# Patient Record
Sex: Male | Born: 1955
Health system: Southern US, Community
[De-identification: ages and names within clinical notes are randomized; demographics above are authoritative.]

## PROBLEM LIST (undated history)

## (undated) DIAGNOSIS — I10 Essential (primary) hypertension: Secondary | ICD-10-CM

## (undated) DIAGNOSIS — H35039 Hypertensive retinopathy, unspecified eye: Secondary | ICD-10-CM

## (undated) DIAGNOSIS — T7840XA Allergy, unspecified, initial encounter: Secondary | ICD-10-CM

## (undated) DIAGNOSIS — H3530 Unspecified macular degeneration: Secondary | ICD-10-CM

## (undated) DIAGNOSIS — H269 Unspecified cataract: Secondary | ICD-10-CM

## (undated) HISTORY — DX: Allergy, unspecified, initial encounter: T78.40XA

## (undated) HISTORY — DX: Unspecified cataract: H26.9

## (undated) HISTORY — DX: Essential (primary) hypertension: I10

## (undated) HISTORY — PX: COLONOSCOPY: SHX174

## (undated) HISTORY — PX: GALLBLADDER SURGERY: SHX652

## (undated) HISTORY — DX: Hypertensive retinopathy, unspecified eye: H35.039

## (undated) HISTORY — PX: EYE SURGERY: SHX253

## (undated) HISTORY — DX: Unspecified macular degeneration: H35.30

## (undated) HISTORY — PX: CHOLECYSTECTOMY: SHX55

---

## 2005-02-19 ENCOUNTER — Emergency Department (HOSPITAL_COMMUNITY): Admission: EM | Admit: 2005-02-19 | Discharge: 2005-02-19 | Payer: Self-pay | Admitting: *Deleted

## 2006-03-05 ENCOUNTER — Ambulatory Visit: Payer: Self-pay | Admitting: Internal Medicine

## 2006-04-01 ENCOUNTER — Ambulatory Visit: Payer: Self-pay | Admitting: Internal Medicine

## 2006-04-01 ENCOUNTER — Encounter (INDEPENDENT_AMBULATORY_CARE_PROVIDER_SITE_OTHER): Payer: Self-pay | Admitting: *Deleted

## 2008-04-16 HISTORY — PX: ACHILLES TENDON REPAIR: SUR1153

## 2010-01-02 ENCOUNTER — Inpatient Hospital Stay (HOSPITAL_COMMUNITY): Admission: RE | Admit: 2010-01-02 | Discharge: 2010-01-04 | Payer: Self-pay | Admitting: Orthopedic Surgery

## 2010-06-29 LAB — COMPREHENSIVE METABOLIC PANEL
ALT: 19 U/L (ref 0–53)
AST: 20 U/L (ref 0–37)
Albumin: 3.9 g/dL (ref 3.5–5.2)
Alkaline Phosphatase: 40 U/L (ref 39–117)
BUN: 10 mg/dL (ref 6–23)
CO2: 27 mEq/L (ref 19–32)
Calcium: 9 mg/dL (ref 8.4–10.5)
Chloride: 102 mEq/L (ref 96–112)
Creatinine, Ser: 1.01 mg/dL (ref 0.4–1.5)
GFR calc Af Amer: >60 mL/min (ref >60)
GFR calc non Af Amer: >60 mL/min (ref >60)
Glucose, Bld: 92 mg/dL (ref 70–99)
Potassium: 4.6 mEq/L (ref 3.5–5.1)
Sodium: 136 mEq/L (ref 135–145)
Total Bilirubin: 0.9 mg/dL (ref 0.3–1.2)
Total Protein: 8 g/dL (ref 6.0–8.3)

## 2010-06-29 LAB — URINALYSIS, ROUTINE W REFLEX MICROSCOPIC
Bilirubin Urine: NEGATIVE
Glucose, UA: NEGATIVE mg/dL
Hgb urine dipstick: NEGATIVE
Ketones, ur: NEGATIVE mg/dL
Nitrite: NEGATIVE
Protein, ur: NEGATIVE mg/dL
Specific Gravity, Urine: 1.007 (ref 1.005–1.030)
Urobilinogen, UA: 0.2 mg/dL (ref 0.0–1.0)
pH: 7.5 (ref 5.0–8.0)

## 2010-06-29 LAB — PROTIME-INR
INR: 0.99 (ref 0.00–1.49)
INR: 1.07 (ref 0.00–1.49)
INR: 1.54 — ABNORMAL HIGH (ref 0.00–1.49)
Prothrombin Time: 13.3 seconds (ref 11.6–15.2)
Prothrombin Time: 14.1 seconds (ref 11.6–15.2)
Prothrombin Time: 18.7 seconds — ABNORMAL HIGH (ref 11.6–15.2)

## 2010-06-29 LAB — DIFFERENTIAL
Basophils Absolute: 0 10*3/uL (ref 0.0–0.1)
Basophils Relative: 1 % (ref 0–1)
Eosinophils Absolute: 0.2 10*3/uL (ref 0.0–0.7)
Eosinophils Relative: 3 % (ref 0–5)
Lymphocytes Relative: 35 % (ref 12–46)
Lymphs Abs: 2.7 10*3/uL (ref 0.7–4.0)
Monocytes Absolute: 0.6 10*3/uL (ref 0.1–1.0)
Monocytes Relative: 7 % (ref 3–12)
Neutro Abs: 4.2 10*3/uL (ref 1.7–7.7)
Neutrophils Relative %: 54 % (ref 43–77)

## 2010-06-29 LAB — CBC
HCT: 45.7 % (ref 39.0–52.0)
Hemoglobin: 15.9 g/dL (ref 13.0–17.0)
MCH: 31 pg (ref 26.0–34.0)
MCHC: 34.9 g/dL (ref 30.0–36.0)
MCV: 88.8 fL (ref 78.0–100.0)
Platelets: 171 10*3/uL (ref 150–400)
RBC: 5.15 MIL/uL (ref 4.22–5.81)
RDW: 13.8 % (ref 11.5–15.5)
WBC: 7.9 10*3/uL (ref 4.0–10.5)

## 2010-06-29 LAB — APTT: aPTT: 32 seconds (ref 24–37)

## 2010-06-29 LAB — SURGICAL PCR SCREEN
MRSA, PCR: NEGATIVE
Staphylococcus aureus: NEGATIVE

## 2016-04-30 ENCOUNTER — Encounter: Payer: Self-pay | Admitting: Internal Medicine

## 2017-11-14 ENCOUNTER — Ambulatory Visit (INDEPENDENT_AMBULATORY_CARE_PROVIDER_SITE_OTHER): Payer: BLUE CROSS/BLUE SHIELD | Admitting: Family Medicine

## 2017-11-14 ENCOUNTER — Encounter: Payer: Self-pay | Admitting: Family Medicine

## 2017-11-14 VITALS — BP 138/85 | HR 72 | Temp 97.0°F | Ht 73.0 in | Wt 254.8 lb

## 2017-11-14 DIAGNOSIS — M7711 Lateral epicondylitis, right elbow: Secondary | ICD-10-CM

## 2017-11-14 NOTE — Progress Notes (Signed)
BP 138/85   Pulse 72   Temp (!) 97 F (36.1 C) (Oral)   Ht 6\' 1"  (1.854 m)   Wt 254 lb 12.8 oz (115.6 kg)   BMI 33.62 kg/m    Subjective:    [name], male    DOB: Sep 01, 1955, 62 y.o.   MRN: [id number]  HPI: Eric Key is a 62 y.o. male presenting on 11/14/2017 for New Patient (Initial Visit) and Elbow Pain (right. Patient states 6/18 that he did lawn work and then next morning right shoulder pain and now has moved down to elbow.)   HPI Right elbow pain Patient is coming as a new patient for office with complaints of right elbow and arm pain.  Patient stated that this started around 10/01/2017.  He says he was doing lawn work especially using a weed eater and then the next morning woke up with right shoulder pain initially that moved down to her right lateral elbow pain.  Patient says that the pain hurts with grip strength and rotation of his forearm.  The pain has been nagging and more over the past month.  He describes the pain as mild to moderate in severity but just wants to know what he can do to help with it.  He denies any pain in his shoulder currently but says it is all in his lateral elbow now.  Relevant past medical, surgical, family and social history reviewed and updated as indicated. Interim medical history since our last visit reviewed. Allergies and medications reviewed and updated.  Review of Systems  Constitutional: Negative for chills and fever.  Eyes: Negative for visual disturbance.  Respiratory: Negative for shortness of breath and wheezing.   Cardiovascular: Negative for chest pain and leg swelling.  Musculoskeletal: Positive for arthralgias. Negative for back pain, gait problem, joint swelling and myalgias.  Skin: Negative for rash.  Neurological: Negative for dizziness, weakness and numbness.  All other systems reviewed and are negative.   Per HPI unless specifically indicated above  Social History   Socioeconomic History  .  Marital status: Divorced    Spouse name: Not on file  . Number of children: Not on file  . Years of education: Not on file  . Highest education level: Not on file  Occupational History  . Not on file  Social Needs  . Financial resource strain: Not on file  . Food insecurity:    Worry: Not on file    Inability: Not on file  . Transportation needs:    Medical: Not on file    Non-medical: Not on file  Tobacco Use  . Smoking status: Former Smoker    Last attempt to quit: 11/14/2005    Years since quitting: 12.0  . Smokeless tobacco: Never Used  Substance and Sexual Activity  . Alcohol use: Yes    Comment: occ  . Drug use: Never  . Sexual activity: Yes    Birth control/protection: Surgical  Lifestyle  . Physical activity:    Days per week: Not on file    Minutes per session: Not on file  . Stress: Not on file  Relationships  . Social connections:    Talks on phone: Not on file    Gets together: Not on file    Attends religious service: Not on file    Active member of club or organization: Not on file    Attends meetings of clubs or organizations: Not on file    Relationship status:  Not on file  . Intimate partner violence:    Fear of current or ex partner: Not on file    Emotionally abused: Not on file    Physically abused: Not on file    Forced sexual activity: Not on file  Other Topics Concern  . Not on file  Social History Narrative  . Not on file    Past Surgical History:  Procedure Laterality Date  . ACHILLES TENDON REPAIR  2010  . GALLBLADDER SURGERY      Family History  Problem Relation Age of Onset  . Diabetes Brother   . Heart disease Brother   . Diabetes Maternal Grandfather     Allergies as of 11/14/2017      Reactions   Penicillins Hives      Medication List    as of 11/14/2017  4:24 PM   You have not been prescribed any medications.        Objective:    BP 138/85   Pulse 72   Temp (!) 97 F (36.1 C) (Oral)   Ht 6\' 1"  (1.854 m)   Wt  254 lb 12.8 oz (115.6 kg)   BMI 33.62 kg/m   Wt Readings from Last 3 Encounters:  11/14/17 254 lb 12.8 oz (115.6 kg)    Physical Exam  Constitutional: He is oriented to person, place, and time. He appears well-developed and well-nourished. No distress.  Eyes: Conjunctivae are normal. No scleral icterus.  Neck: Neck supple. No thyromegaly present.  Cardiovascular: Normal rate, regular rhythm, normal heart sounds and intact distal pulses.  No murmur heard. Pulmonary/Chest: Effort normal and breath sounds normal. No respiratory distress. He has no wheezes.  Musculoskeletal: Normal range of motion. He exhibits no edema.       Right elbow: He exhibits no swelling, no effusion and no deformity. Tenderness found. Lateral epicondyle tenderness noted.  Lymphadenopathy:    He has no cervical adenopathy.  Neurological: He is alert and oriented to person, place, and time. Coordination normal.  Skin: Skin is warm and dry. No rash noted. He is not diaphoretic.  Psychiatric: He has a normal mood and affect. His behavior is normal.  Nursing note and vitals reviewed.       Assessment & Plan:   Problem List Items Addressed This Visit    None    Visit Diagnoses    Lateral epicondylitis of right elbow    -  Primary    We discussed possible options including injection versus using oral anti-inflammatory such as ibuprofen for couple weeks.  Patient opted to do the oral anti-inflammatories for now and if it does not improve then will do injection.  Also gave patient a list of stretches to help and exercises to help.  Follow up plan: Return if symptoms worsen or fail to improve.  Caryl Pina, MD [facility] 11/14/2017, 4:24 PM

## 2017-11-29 ENCOUNTER — Encounter: Payer: Self-pay | Admitting: Family Medicine

## 2017-11-29 ENCOUNTER — Ambulatory Visit (INDEPENDENT_AMBULATORY_CARE_PROVIDER_SITE_OTHER): Payer: BLUE CROSS/BLUE SHIELD | Admitting: Family Medicine

## 2017-11-29 VITALS — BP 131/73 | HR 69 | Temp 97.6°F | Ht 73.0 in | Wt 259.6 lb

## 2017-11-29 DIAGNOSIS — Z131 Encounter for screening for diabetes mellitus: Secondary | ICD-10-CM | POA: Diagnosis not present

## 2017-11-29 DIAGNOSIS — M7711 Lateral epicondylitis, right elbow: Secondary | ICD-10-CM | POA: Diagnosis not present

## 2017-11-29 DIAGNOSIS — Z125 Encounter for screening for malignant neoplasm of prostate: Secondary | ICD-10-CM | POA: Diagnosis not present

## 2017-11-29 DIAGNOSIS — Z0000 Encounter for general adult medical examination without abnormal findings: Secondary | ICD-10-CM

## 2017-11-29 DIAGNOSIS — Z13220 Encounter for screening for lipoid disorders: Secondary | ICD-10-CM | POA: Diagnosis not present

## 2017-11-29 MED ORDER — METHYLPREDNISOLONE ACETATE 80 MG/ML IJ SUSP
40.0000 mg | Freq: Once | INTRAMUSCULAR | Status: AC
  Administered 2017-11-29: 40 mg via INTRAMUSCULAR

## 2017-11-29 NOTE — Progress Notes (Addendum)
BP 131/73   Pulse 69   Temp 97.6 F (36.4 C) (Oral)   Ht 6' 1"  (1.854 m)   Wt 259 lb 9.6 oz (117.8 kg)   BMI 34.25 kg/m    Subjective:    [name], male    DOB: Sep 07, 1955, 62 y.o.   MRN: [id number]  HPI: Eric Key is a 62 y.o. male presenting on 11/29/2017 for New Patient (Initial Visit) (Patient is still having right elbow pain- patient was seen 8/1) and Establish Care   HPI Well adult exam and physical Patient is coming in for well adult exam and physical.  He denies any major health issues except he is still having problems with his right lateral elbow.  He has been taking ibuprofen twice a day every day over the past couple weeks and says it has helped a little bit but has not made a significant difference.  He is ready to try the injection today.  He denies any numbness or weakness but just has pain with movement especially repetitive movement of that right arm on the lateral aspect of his right elbow. Patient denies any chest pain, shortness of breath, headaches or vision issues, abdominal complaints, diarrhea, nausea, vomiting.   Relevant past medical, surgical, family and social history reviewed and updated as indicated. Interim medical history since our last visit reviewed. Allergies and medications reviewed and updated.  Review of Systems  Constitutional: Negative for chills and fever.  HENT: Negative for ear pain and tinnitus.   Eyes: Negative for pain.  Respiratory: Negative for cough, shortness of breath and wheezing.   Cardiovascular: Negative for chest pain, palpitations and leg swelling.  Gastrointestinal: Negative for abdominal pain, blood in stool, constipation and diarrhea.  Genitourinary: Negative for dysuria and hematuria.  Musculoskeletal: Positive for arthralgias. Negative for back pain, gait problem and myalgias.  Skin: Negative for rash.  Neurological: Negative for dizziness, weakness and headaches.  Psychiatric/Behavioral:  Negative for suicidal ideas.  All other systems reviewed and are negative.   Per HPI unless specifically indicated above   Allergies as of 11/29/2017      Reactions   Penicillins Hives      Medication List    as of 11/29/2017  8:42 AM   You have not been prescribed any medications.        Objective:    BP 131/73   Pulse 69   Temp 97.6 F (36.4 C) (Oral)   Ht 6' 1"  (1.854 m)   Wt 259 lb 9.6 oz (117.8 kg)   BMI 34.25 kg/m   Wt Readings from Last 3 Encounters:  11/29/17 259 lb 9.6 oz (117.8 kg)  11/14/17 254 lb 12.8 oz (115.6 kg)    Physical Exam  Constitutional: He is oriented to person, place, and time. He appears well-developed and well-nourished. No distress.  HENT:  Nose: Nose normal.  Mouth/Throat: Oropharynx is clear and moist. No oropharyngeal exudate.  Eyes: Pupils are equal, round, and reactive to light. Conjunctivae and EOM are normal. No scleral icterus.  Neck: Neck supple. No thyromegaly present.  Cardiovascular: Normal rate, regular rhythm, normal heart sounds and intact distal pulses.  No murmur heard. Pulmonary/Chest: Effort normal and breath sounds normal. No respiratory distress. He has no wheezes.  Abdominal: Soft. Bowel sounds are normal. He exhibits no distension. There is no tenderness. There is no guarding.  Musculoskeletal: Normal range of motion. He exhibits no edema.  Lymphadenopathy:    He has no cervical adenopathy.  Neurological: He is alert and oriented to person, place, and time. He displays normal reflexes. Coordination normal.  Skin: Skin is warm and dry. No rash noted. He is not diaphoretic.  Psychiatric: He has a normal mood and affect. His behavior is normal.  Nursing note and vitals reviewed.  Lateral elbow condylitis injection: Consent form signed. Risk factors of bleeding and infection discussed with patient and patient is agreeable towards injection. Patient prepped with Betadine. Lateral approach towards injection used.  Injected 40 mg of Depo-Medrol and 1 mL of 2% lidocaine. Patient tolerated procedure well and no side effects from noted. Minimal to no bleeding. Simple bandage applied after.     Assessment & Plan:   Problem List Items Addressed This Visit    None    Visit Diagnoses    Well adult exam    -  Primary   Relevant Orders   CBC with Differential/Platelet   CMP14+EGFR   Lipid panel   PSA, total and free   Prostate cancer screening       Relevant Orders   PSA, total and free   Lateral epicondylitis of right elbow       Relevant Medications   methylPREDNISolone acetate (DEPO-MEDROL) injection 40 mg (Start on 11/29/2017  8:45 AM)   Lipid screening       Relevant Orders   Lipid panel   Diabetes mellitus screening       Relevant Orders   CMP14+EGFR       Follow up plan: Return in about 1 year (around 11/30/2018), or if symptoms worsen or fail to improve.  Counseling provided for all of the vaccine components Orders Placed This Encounter  Procedures  . CBC with Differential/Platelet  . CMP14+EGFR  . Lipid panel  . PSA, total and free    Caryl Pina, MD [facility] 11/29/2017, 8:42 AM

## 2017-11-30 LAB — CMP14+EGFR
A/G RATIO: 1.3 (ref 1.2–2.2)
ALBUMIN: 4 g/dL (ref 3.6–4.8)
ALT: 16 IU/L (ref 0–44)
AST: 17 IU/L (ref 0–40)
Alkaline Phosphatase: 39 IU/L (ref 39–117)
BUN/Creatinine Ratio: 13 (ref 10–24)
BUN: 13 mg/dL (ref 8–27)
CHLORIDE: 103 mmol/L (ref 96–106)
CO2: 23 mmol/L (ref 20–29)
Calcium: 9.2 mg/dL (ref 8.6–10.2)
Creatinine, Ser: 1.03 mg/dL (ref 0.76–1.27)
GFR calc non Af Amer: 79 mL/min/{1.73_m2} (ref >59)
GFR, EST AFRICAN AMERICAN: 91 mL/min/{1.73_m2} (ref >59)
Globulin, Total: 3.1 g/dL (ref 1.5–4.5)
Glucose: 113 mg/dL — ABNORMAL HIGH (ref 65–99)
Potassium: 4.5 mmol/L (ref 3.5–5.2)
Sodium: 137 mmol/L (ref 134–144)
TOTAL PROTEIN: 7.1 g/dL (ref 6.0–8.5)

## 2017-11-30 LAB — LIPID PANEL
CHOL/HDL RATIO: 4.2 ratio (ref 0.0–5.0)
Cholesterol, Total: 165 mg/dL (ref 100–199)
HDL: 39 mg/dL — ABNORMAL LOW (ref >39)
LDL Calculated: 103 mg/dL — ABNORMAL HIGH (ref 0–99)
Triglycerides: 117 mg/dL (ref 0–149)
VLDL CHOLESTEROL CAL: 23 mg/dL (ref 5–40)

## 2017-11-30 LAB — PSA, TOTAL AND FREE
PSA, Free Pct: 36.2 %
PSA, Free: 0.47 ng/mL
Prostate Specific Ag, Serum: 1.3 ng/mL (ref 0.0–4.0)

## 2017-11-30 LAB — CBC WITH DIFFERENTIAL/PLATELET
Basophils Absolute: 0 10*3/uL (ref 0.0–0.2)
Basos: 1 %
EOS (ABSOLUTE): 0.2 10*3/uL (ref 0.0–0.4)
Eos: 4 %
Hematocrit: 41.6 % (ref 37.5–51.0)
Hemoglobin: 13.4 g/dL (ref 13.0–17.7)
Immature Grans (Abs): 0 10*3/uL (ref 0.0–0.1)
Immature Granulocytes: 0 %
Lymphocytes Absolute: 2 10*3/uL (ref 0.7–3.1)
Lymphs: 34 %
MCH: 29.5 pg (ref 26.6–33.0)
MCHC: 32.2 g/dL (ref 31.5–35.7)
MCV: 92 fL (ref 79–97)
Monocytes Absolute: 0.5 10*3/uL (ref 0.1–0.9)
Monocytes: 9 %
Neutrophils Absolute: 3.2 10*3/uL (ref 1.4–7.0)
Neutrophils: 52 %
Platelets: 187 10*3/uL (ref 150–450)
RBC: 4.54 x10E6/uL (ref 4.14–5.80)
RDW: 12.8 % (ref 12.3–15.4)
WBC: 5.9 10*3/uL (ref 3.4–10.8)

## 2018-09-05 ENCOUNTER — Telehealth: Payer: Self-pay | Admitting: Family Medicine

## 2019-06-16 NOTE — Progress Notes (Signed)
[facility] Note  06/18/2019     CHIEF COMPLAINT Patient presents for Retina Evaluation   HISTORY OF PRESENT ILLNESS: Eric Key is a 64 y.o. male who presents to the clinic today for:   HPI    Retina Evaluation    In left eye.  This started 2 days ago.  Duration of 2 days.  Associated Symptoms Distortion.  Context:  distance vision, mid-range vision and near vision.  Treatments tried include no treatments.  I, the attending physician,  performed the HPI with the patient and updated documentation appropriately.          Comments    64 y/o male pt referred by Dr. [name] on 06/15/19 for eval of mac heme OS.  Pt works with steel, and noticed 3 days ago that a straight section of pipe looked crooked.  He went to see Dr. Molli Posey that day, and she sent him here.  Last full eye exam more than 10 yrs ago.  VA good OU cc, but now a bit blurred OS.  Denies pain, flashes, floaters.  Distortion only in OS.  No gtts.  Nothing like this has ever happened before.       Last edited by [name], MD on 06/18/2019 10:33 PM. (History)    pt states for about 2 weeks he has felt like his left eye vision was down, he states he noticed a piece of steel at work looked crooked as did the trim in his office, so he decided to go to the eye dr, he states he saw Dr. Jabier Mutton about 10 years ago when he moved to [facility] to get glasses, he saw her last Monday bc of the vision changes and she sent him here for a second opinion, pt states he used to have high blood pressure, but does not currently take any medications, he denies family hx of eye problems  Referring physician: [name], [name], MD [place],  [place] [id number]  HISTORICAL INFORMATION:   Selected notes from the [facility] Referred by Dr. Molli Posey for concern of mac heme OS.   CURRENT MEDICATIONS: No current outpatient medications on file. (Ophthalmic Drugs)   No current  facility-administered medications for this visit. (Ophthalmic Drugs)   No current outpatient medications on file. (Other)   No current facility-administered medications for this visit. (Other)      REVIEW OF SYSTEMS: ROS    Positive for: Eyes   Negative for: Constitutional, Gastrointestinal, Neurological, Skin, Genitourinary, Musculoskeletal, HENT, Endocrine, Cardiovascular, Respiratory, Psychiatric, Allergic/Imm, Heme/Lymph   Last edited by Matthew Folks, COA on 06/18/2019  8:54 AM. (History)       ALLERGIES Allergies  Allergen Reactions  . Penicillins Hives    PAST MEDICAL HISTORY Past Medical History:  Diagnosis Date  . Hypertension    Past Surgical History:  Procedure Laterality Date  . ACHILLES TENDON REPAIR  2010  . GALLBLADDER SURGERY      FAMILY HISTORY Family History  Problem Relation Age of Onset  . Diabetes Brother   . Heart disease Brother   . Diabetes Maternal Grandfather     SOCIAL HISTORY Social History   Tobacco Use  . Smoking status: Former Smoker    Quit date: 11/14/2005    Years since quitting: 13.6  . Smokeless tobacco: Never Used  Substance Use Topics  . Alcohol use: Yes    Comment: occ  . Drug use: Never  OPHTHALMIC EXAM:  Base Eye Exam    Visual Acuity (Snellen - Linear)      Right Left   Dist cc 20/20 -2 20/40 -2   Dist ph cc  NI   Correction: Glasses       Tonometry (Tonopen, 8:55 AM)      Right Left   Pressure 12 13       Pupils      Dark Light Shape React APD   Right 4 3 Round Brisk None   Left 4 3 Round Brisk None       Visual Fields (Counting fingers)      Left Right    Full Full       Extraocular Movement      Right Left    Full, Ortho Full, Ortho       Neuro/Psych    Oriented x3: Yes   Mood/Affect: Normal       Dilation    Both eyes: 1.0% Mydriacyl, 2.5% Phenylephrine @ 8:55 AM        Slit Lamp and Fundus Exam    Slit Lamp Exam      Right Left   Lids/Lashes Dermatochalasis -  upper lid Dermatochalasis - upper lid   Conjunctiva/Sclera White and quiet White and quiet   Cornea mild Arcus, mild Debris in tear film mild Arcus, mild Debris in tear film, 1+ inferior Punctate epithelial erosions   Anterior Chamber Deep and quiet Deep and quiet   Iris Round and dilated Round and dilated   Lens 2+ Nuclear sclerosis, 2+ Cortical cataract 2+ Nuclear sclerosis, 2+ Cortical cataract   Vitreous Vitreous syneresis Vitreous syneresis       Fundus Exam      Right Left   Disc Pink and Sharp, focal PPP ST Pink and Sharp   C/D Ratio 0.2 0.3   Macula Flat, Drusen, RPE mottling and clumping, early Atrophy Abnormal foveal reflex, +CNV with +IRH/SRH, +central edema, Drusen, RPE mottling and clumping   Vessels Vascular attenuation, AV crossing changes, perivascular pigment clumping Vascular attenuation, Tortuousity, perivascular pigment clumping   Periphery Attached, peripheral pigmented CR scarring and perivascular bone spicules 360 Attached, peripheral pigmented CR scarring and perivascular bone spicules 360        Refraction    Wearing Rx      Sphere Cylinder Axis   Right [place] +1.00 171   Left +0.25 +0.50 174   Age: 37 yrs       Manifest Refraction      Sphere Cylinder Axis Dist VA   Right +0.25 +1.00 180 20/20   Left +0.25 +0.25 170 20/40-2          IMAGING AND PROCEDURES  Imaging and Procedures for _0 @  OCT, Retina - OU - Both Eyes       Right Eye Quality was good. Central Foveal Thickness: 309. Progression has no prior data. Findings include normal foveal contour, no IRF, no SRF, retinal drusen , outer retinal atrophy, pigment epithelial detachment (Low lying PEDs temporal macula caught on widefield).   Left Eye Quality was good. Central Foveal Thickness: 523. Progression has no prior data. Findings include abnormal foveal contour, intraretinal fluid, subretinal hyper-reflective material, pigment epithelial detachment, outer retinal atrophy, retinal drusen   (Mild vitreous opacitites).   Notes *Images captured and stored on drive  Diagnosis / Impression:  OD: non-exu ARMD  OS: exu ARMD  Clinical management:  See below  Abbreviations: NFP - Normal foveal profile. CME - cystoid macular  edema. PED - pigment epithelial detachment. IRF - intraretinal fluid. SRF - subretinal fluid. EZ - ellipsoid zone. ERM - epiretinal membrane. ORA - outer retinal atrophy. ORT - outer retinal tubulation. SRHM - subretinal hyper-reflective material        Color Fundus Photography Optos - OU - Both Eyes       Right Eye Progression has no prior data. Disc findings include normal observations (Focal PPA ST). Macula : retinal pigment epithelium abnormalities, drusen. Vessels : attenuated. Periphery : RPE abnormality (360 pigmented CR scarring and perivascular pigment clumping and bone spicules).   Left Eye Progression has no prior data. Disc findings include normal observations. Macula : hemorrhage, retinal pigment epithelium abnormalities, drusen. Vessels : attenuated. Periphery : RPE abnormality (360 pigmented CR scarring and perivascular pigment clumping and bone spicules).   Notes **Images stored on drive**  Impression: Pigmented CR scarring and perivascular pigment clumping / bone spicules OU Central CNV w/ subretinal heme OS         Intravitreal Injection, Pharmacologic Agent - OS - Left Eye       Time Out 06/18/2019. 10:21 AM. Confirmed correct patient, procedure, site, and patient consented.   Anesthesia Topical anesthesia was used. Anesthetic medications included Lidocaine 2%, Proparacaine 0.5%.   Procedure Preparation included 5% betadine to ocular surface, eyelid speculum. A supplied needle was used.   Injection:  1.25 mg Bevacizumab (AVASTIN) SOLN   NDC: 31517-616-07, Lot: [id number] , Expiration date: 07/01/2019   Route: Intravitreal, Site: Left Eye, Waste: 0 mL  Post-op Post injection exam found visual acuity of at least counting  fingers. The patient tolerated the procedure well. There were no complications. The patient received written and verbal post procedure care education.                 ASSESSMENT/PLAN:    ICD-10-CM   1. Exudative age-related macular degeneration of left eye with active choroidal neovascularization (HCC)  H35.3221 Intravitreal Injection, Pharmacologic Agent - OS - Left Eye    Bevacizumab (AVASTIN) SOLN 1.25 mg  2. Retinal edema  H35.81 OCT, Retina - OU - Both Eyes  3. 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 Pigmentary retinopathy  H35.52 Color Fundus Photography Optos - OU - Both Eyes  5. Essential hypertension  I10   6. Hypertensive retinopathy of both eyes  H35.033   7. Combined forms of age-related cataract of both eyes  H25.813     1,2. 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, OS  - The incidence pathology and anatomy of wet AMD discussed   - The ANCHOR, MARINA, CATT and VIEW trials discussed with patient.    - discussed treatment options including observation vs intravitreal anti-VEGF agents such as Avastin, Lucentis, Eylea.    - Risks of endophthalmitis and vascular occlusive events and atrophic changes discussed with patient  - exam shows central CNV with +subretinal heme  - OCT shows CNV/PED w/ IRF and SRHM  - recommend IVA OS #1 today, 03.04.21  - pt wishes to be treated with IVA  - RBA of procedure discussed, questions answered  - informed consent obtained, signed and scanned, 03.04.21  - see procedure note  - f/u in 4 wks  3. Age related macular degeneration, non-exudative, OD  - The incidence, anatomy, and pathology of dry AMD, risk of progression, and the AREDS and AREDS 2 study including smoking risks discussed with patient.  - Recommend amsler grid monitoring  4. Pigmentary retinopathy OU  -  peripheral pigmented CR scarring and perivascular pigment clumping / bone spicules OU  - VA remains fairly good  (20/20 OD, 20/40 OS with exudative ARMD OS)  - denies night blindness  - denies family history of vision problems  - discussed findings  - broad differential for pigmentary retinopathy:  Congenital (RP), infectious (TB, lyme dx, syphilis, toxo, bartonella), autoimmune and toxic etiologies  - will check FA (optos transit OS) and labs at next visit  5,6. Hypertensive retinopathy OU  - discussed importance of tight BP control  - monitor  7. Mixed form age related cataracts OU  - The symptoms of cataract, surgical options, and treatments and risks were discussed with patient.  - discussed diagnosis and progression  - not yet visually significant  - monitor for now    Ophthalmic Meds Ordered this visit:  Meds ordered this encounter  Medications  . Bevacizumab (AVASTIN) SOLN 1.25 mg       Return in about 4 weeks (around 07/16/2019) for f/u exu ARMD OS, DFE, OCT, Fluorescein Angiogram.  There are no Patient Instructions on file for this visit.   Explained the diagnoses, plan, and follow up with the patient and they expressed understanding.  Patient expressed understanding of the importance of proper follow up care.   This document serves as a record of services personally performed by Gardiner Sleeper, MD, PhD. It was created on their behalf by Leeann Must, [place], a certified ophthalmic assistant. The creation of this record is the provider's dictation and/or activities during the visit.    Electronically signed by: Leeann Must, COA _0 @ 10:55 PM   This document serves as a record of services personally performed by Gardiner Sleeper, MD, PhD. It was created on their behalf by Ernest Mallick, OA, an ophthalmic assistant. The creation of this record is the provider's dictation and/or activities during the visit.    Electronically signed by: Ernest Mallick, OA 03.04.2021 10:55 PM   Gardiner Sleeper, M.D., Ph.D. Diseases & Surgery of the Retina and Vitreous [facility]  I have reviewed the above documentation for accuracy and completeness, and I agree with the above. Gardiner Sleeper, M.D., Ph.D. 06/18/19 10:55 PM   Abbreviations: M myopia (nearsighted); A astigmatism; H hyperopia (farsighted); P presbyopia; Mrx spectacle prescription;  CTL contact lenses; OD right eye; OS left eye; OU both eyes  XT exotropia; ET esotropia; PEK punctate epithelial keratitis; PEE punctate epithelial erosions; DES dry eye syndrome; MGD meibomian gland dysfunction; ATs artificial tears; PFAT's preservative free artificial tears; [place] nuclear sclerotic cataract; PSC posterior subcapsular cataract; ERM epi-retinal membrane; PVD posterior vitreous detachment; RD retinal detachment; DM diabetes mellitus; DR diabetic retinopathy; NPDR non-proliferative diabetic retinopathy; PDR proliferative diabetic retinopathy; CSME clinically significant macular edema; DME diabetic macular edema; dbh dot blot hemorrhages; CWS cotton wool spot; POAG primary open angle glaucoma; C/D cup-to-disc ratio; HVF humphrey visual field; GVF goldmann visual field; OCT optical coherence tomography; IOP intraocular pressure; BRVO Branch retinal vein occlusion; CRVO central retinal vein occlusion; CRAO central retinal artery occlusion; BRAO branch retinal artery occlusion; RT retinal tear; SB scleral buckle; PPV pars plana vitrectomy; VH Vitreous hemorrhage; PRP panretinal laser photocoagulation; IVK intravitreal kenalog; VMT vitreomacular traction; MH Macular hole;  NVD neovascularization of the disc; NVE neovascularization elsewhere; AREDS age related eye disease study; ARMD age related macular degeneration; POAG primary open angle glaucoma; EBMD epithelial/anterior basement membrane dystrophy; ACIOL anterior chamber intraocular lens; IOL intraocular lens; PCIOL posterior chamber intraocular lens; Phaco/IOL  phacoemulsification with intraocular lens placement; [place] photorefractive keratectomy; LASIK laser assisted in situ  keratomileusis; HTN hypertension; DM diabetes mellitus; COPD chronic obstructive pulmonary disease

## 2019-06-18 ENCOUNTER — Encounter (INDEPENDENT_AMBULATORY_CARE_PROVIDER_SITE_OTHER): Payer: Self-pay | Admitting: Ophthalmology

## 2019-06-18 ENCOUNTER — Ambulatory Visit (INDEPENDENT_AMBULATORY_CARE_PROVIDER_SITE_OTHER): Payer: BC Managed Care – PPO | Admitting: Ophthalmology

## 2019-06-18 DIAGNOSIS — H353112 Nonexudative age-related macular degeneration, right eye, intermediate dry stage: Secondary | ICD-10-CM

## 2019-06-18 DIAGNOSIS — H3581 Retinal edema: Secondary | ICD-10-CM | POA: Diagnosis not present

## 2019-06-18 DIAGNOSIS — H353221 Exudative age-related macular degeneration, left eye, with active choroidal neovascularization: Secondary | ICD-10-CM

## 2019-06-18 DIAGNOSIS — H25813 Combined forms of age-related cataract, bilateral: Secondary | ICD-10-CM

## 2019-06-18 DIAGNOSIS — H35033 Hypertensive retinopathy, bilateral: Secondary | ICD-10-CM

## 2019-06-18 DIAGNOSIS — H3552 Pigmentary retinal dystrophy: Secondary | ICD-10-CM | POA: Diagnosis not present

## 2019-06-18 DIAGNOSIS — I10 Essential (primary) hypertension: Secondary | ICD-10-CM

## 2019-06-18 MED ORDER — BEVACIZUMAB CHEMO INJECTION 1.25MG/0.05ML SYRINGE FOR KALEIDOSCOPE
1.2500 mg | INTRAVITREAL | Status: AC | PRN
Start: 2019-06-18 — End: 2019-06-18
  Administered 2019-06-18: 23:00:00 1.25 mg via INTRAVITREAL

## 2019-07-16 NOTE — Progress Notes (Signed)
[facility] Note  07/17/2019     CHIEF COMPLAINT Patient presents for Retina Follow Up   HISTORY OF PRESENT ILLNESS: Eric Key is a 64 y.o. male who presents to the clinic today for:   HPI    Retina Follow Up    Patient presents with  Wet AMD.  In both eyes.  This started 4 weeks ago.  Severity is moderate.  I, the attending physician,  performed the HPI with the patient and updated documentation appropriately.          Comments    Patient here for 4 weeks retina follow up for exu ARMD/pigmented ret OU. Patient states vision about the same. No eye pain.        Last edited by [name], MD on 07/17/2019  7:59 AM. (History)    pt states    Referring physician: [name], [name], MD [place],  [place] [address]  HISTORICAL INFORMATION:   Selected notes from the MEDICAL RECORD NUMBER Referred by Dr. [name] for concern of mac heme OS.   CURRENT MEDICATIONS: No current outpatient medications on file. (Ophthalmic Drugs)   No current facility-administered medications for this visit. (Ophthalmic Drugs)   No current outpatient medications on file. (Other)   No current facility-administered medications for this visit. (Other)      REVIEW OF SYSTEMS: ROS    Positive for: Eyes   Negative for: Constitutional, Gastrointestinal, Neurological, Skin, Genitourinary, Musculoskeletal, HENT, Endocrine, Cardiovascular, Respiratory, Psychiatric, Allergic/Imm, Heme/Lymph   Last edited by Theodore Demark, COA on 07/17/2019  7:55 AM. (History)       ALLERGIES Allergies  Allergen Reactions  . Penicillins Hives    PAST MEDICAL HISTORY Past Medical History:  Diagnosis Date  . Hypertension    Past Surgical History:  Procedure Laterality Date  . ACHILLES TENDON REPAIR  2010  . GALLBLADDER SURGERY      FAMILY HISTORY Family History  Problem Relation Age of Onset  . Diabetes Brother   . Heart disease Brother   .  Diabetes Maternal Grandfather     SOCIAL HISTORY Social History   Tobacco Use  . Smoking status: Former Smoker    Quit date: 11/14/2005    Years since quitting: 13.6  . Smokeless tobacco: Never Used  Substance Use Topics  . Alcohol use: Yes    Comment: occ  . Drug use: Never         OPHTHALMIC EXAM:  Base Eye Exam    Visual Acuity (Snellen - Linear)      Right Left   Dist cc 20/20 -2 20/40 -2   Dist ph cc  NI   Correction: Glasses       Tonometry (Tonopen, 7:53 AM)      Right Left   Pressure 14 13       Pupils      Dark Light Shape React APD   Right 4 3 Round Brisk None   Left 4 3 Round Brisk None       Visual Fields (Counting fingers)      Left Right    Full Full       Extraocular Movement      Right Left    Full, Ortho Full, Ortho       Neuro/Psych    Oriented x3: Yes   Mood/Affect: Normal       Dilation    Both eyes: 1.0% Mydriacyl, 2.5% Phenylephrine @  7:53 AM        Slit Lamp and Fundus Exam    Slit Lamp Exam      Right Left   Lids/Lashes Dermatochalasis - upper lid Dermatochalasis - upper lid   Conjunctiva/Sclera White and quiet White and quiet   Cornea mild Arcus, mild Debris in tear film mild Arcus, mild Debris in tear film, 1+ inferior Punctate epithelial erosions   Anterior Chamber Deep and quiet Deep and quiet   Iris Round and dilated Round and dilated   Lens 2+ Nuclear sclerosis, 2+ Cortical cataract 2+ Nuclear sclerosis, 2+ Cortical cataract   Vitreous Vitreous syneresis Vitreous syneresis       Fundus Exam      Right Left   Disc Pink and Sharp, focal PPP ST Pink and Sharp   C/D Ratio 0.2 0.3   Macula Flat, Drusen, RPE mottling and clumping, early Atrophy, punctate IRH Abnormal foveal reflex, +CNV with +IRH/SRH, +central edema --slightly improved, Drusen, RPE mottling and clumping   Vessels Vascular attenuation, AV crossing changes, perivascular pigment clumping peripherally Vascular attenuation, Tortuousity, perivascular  pigment clumping peripherally   Periphery Attached, peripheral pigmented CR scarring and perivascular bone spicules 360 Attached, peripheral pigmented CR scarring and perivascular bone spicules 360        Refraction    Wearing Rx      Sphere Cylinder Axis   Right [place] +1.00 171   Left +0.25 +0.50 174          IMAGING AND PROCEDURES  Imaging and Procedures for _0 @  OCT, Retina - OU - Both Eyes       Right Eye Quality was good. Central Foveal Thickness: 311. Progression has been stable. Findings include normal foveal contour, no IRF, no SRF, retinal drusen , outer retinal atrophy, pigment epithelial detachment (Low lying PEDs temporal macula caught on widefield; partial PVD, vitreous opacities).   Left Eye Quality was good. Central Foveal Thickness: 529. Progression has improved. Findings include abnormal foveal contour, intraretinal fluid, subretinal hyper-reflective material, pigment epithelial detachment, outer retinal atrophy, retinal drusen  (Interval improvement in IRF/IRHM).   Notes *Images captured and stored on drive  Diagnosis / Impression:  OD: non-exu ARMD  OS: exu ARMD -- Interval improvement in IRF/IRHM  Clinical management:  See below  Abbreviations: NFP - Normal foveal profile. CME - cystoid macular edema. PED - pigment epithelial detachment. IRF - intraretinal fluid. SRF - subretinal fluid. EZ - ellipsoid zone. ERM - epiretinal membrane. ORA - outer retinal atrophy. ORT - outer retinal tubulation. SRHM - subretinal hyper-reflective material        Fluorescein Angiography Optos (Transit OS)       Right Eye   Progression has no prior data. Early phase findings include blockage, staining, window defect. Mid/Late phase findings include blockage, staining, window defect (peripheral blockage, staining and window defect ).   Left Eye   Progression has no prior data. Early phase findings include blockage, staining, window defect. Mid/Late phase  findings include blockage, staining, window defect, choroidal neovascularization, leakage (Focal CNV temporal to fovea with late leakage; peripheral blockage, staining and window defect).   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 9:01 AM. Confirmed correct patient, procedure, site, and patient consented.   Anesthesia Topical anesthesia was used. Anesthetic medications included Lidocaine 2%, Proparacaine 0.5%.   Procedure Preparation included 5% betadine to ocular surface, eyelid speculum. A (32g) needle was used.   Injection:  1.25 mg Bevacizumab (AVASTIN) SOLN   NDC: 74163-845-36, Lot: [id number] , Expiration date: 09/16/2019   Route: Intravitreal, Site: Left Eye, Waste: 0 mL  Post-op Post injection exam found visual acuity of at least counting fingers. The patient tolerated the procedure well. There were no complications. The patient received written and verbal post procedure care education.                 ASSESSMENT/PLAN:    ICD-10-CM   1. Exudative age-related macular degeneration of left eye with active choroidal neovascularization (HCC)  H35.3221 Intravitreal Injection, Pharmacologic Agent - OS - Left Eye    Bevacizumab (AVASTIN) SOLN 1.25 mg  2. Retinal edema  H35.81 OCT, Retina - OU - Both Eyes  3. 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 Essential hypertension  I10   6. Hypertensive retinopathy of both eyes  H35.033 Fluorescein Angiography Optos (Transit OS)  7. Combined forms of age-related cataract of both eyes  H25.813     1,2. 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, OS  - s/p IVA OS #1 (03.04.21)  - exam shows central CNV with +subretinal heme -- improved  - BCVA stable at 20/40 OS  - OCT shows CNV/PED w/ IRF and SRHM -- interval improvement in IRF/IRHM  - recommend IVA OS #2 today, 04.02.21  - pt wishes to be treated with IVA  - RBA of procedure discussed, questions answered  - informed consent obtained   - IVA consent form signed and scanned, 03.04.21  - see procedure note  - f/u in 4 wks -- DFE/OCT, possible injection  3. Age related macular degeneration, non-exudative, OD  - The incidence, anatomy, and pathology of dry AMD, risk of progression, and the AREDS and AREDS 2 study including smoking risks discussed with patient.  - Recommend amsler grid monitoring  4. Pigmentary retinopathy OU  - peripheral pigmented CR scarring and perivascular pigment clumping / bone spicules OU  - VA remains fairly good (20/20 OD, 20/40 OS with exudative ARMD OS)  - denies night blindness  - denies family history of vision problems  - discussed findings  - broad differential for pigmentary retinopathy:  Congenital (RP), infectious (TB, lyme dx, syphilis, toxo, bartonella), autoimmune and toxic etiologies  - will check labs:    Rheumatoid factor   CBC   Comprehensive metabolic panel     RPR   ANA   ESR, CRP   ACE   ANCA   Toxoplasma antibodies IgG / IgM   Quantiferon - TB Gold Plus   HLA, A, B, C (IR)   Fluorescent treponemal ab (fla) - IgG - bld   VDRL Serum   Bartonella antibody  - f/u  in 4 wks as above -- consider [facility] genetic  testing  5,6. Hypertensive retinopathy OU  - discussed importance of tight BP control  - monitor  7. Mixed form age related cataracts OU  - The symptoms of cataract, surgical options, and treatments and risks were discussed with patient.  - discussed diagnosis and progression  - not yet visually significant  - monitor for now    Ophthalmic Meds Ordered this visit:  Meds ordered this encounter  Medications  . Bevacizumab (AVASTIN) SOLN 1.25 mg       Return in about 4 weeks (around 08/14/2019) for f/u exu ARMD OS, DFE, OCT.  There are no Patient Instructions on file for this visit.   Explained the diagnoses, plan, and follow up with the patient and they expressed understanding.  Patient expressed understanding of the importance of proper follow up care.   This document serves as a record of services personally performed by Gardiner Sleeper, MD, PhD. It was created on their behalf by Ernest Mallick, OA, an ophthalmic assistant. The creation of this record is the provider's dictation and/or activities during the visit.    Electronically signed by: Ernest Mallick, OA 04.01.2021 11:45 AM   Gardiner Sleeper, M.D., Ph.D. Diseases & Surgery of the Retina and Vitreous [facility]  I have reviewed the above documentation for accuracy and completeness, and I agree with the above. Gardiner Sleeper, M.D., Ph.D. 07/17/19 11:45 AM   Abbreviations: M myopia (nearsighted); A astigmatism; H hyperopia (farsighted); P presbyopia; Mrx spectacle prescription;  CTL contact lenses; OD right eye; OS left eye; OU both eyes  XT exotropia; ET esotropia; PEK punctate epithelial keratitis; PEE punctate epithelial erosions; DES dry eye syndrome; MGD meibomian gland dysfunction; ATs artificial tears; PFAT's preservative free artificial tears; [place] nuclear sclerotic cataract; PSC posterior subcapsular cataract; ERM epi-retinal membrane; PVD posterior vitreous detachment; RD retinal detachment;  DM diabetes mellitus; DR diabetic retinopathy; NPDR non-proliferative diabetic retinopathy; PDR proliferative diabetic retinopathy; CSME clinically significant macular edema; DME diabetic macular edema; dbh dot blot hemorrhages; CWS cotton wool spot; POAG primary open angle glaucoma; C/D cup-to-disc ratio; HVF humphrey visual field; GVF goldmann visual field; OCT optical coherence tomography; IOP intraocular pressure; BRVO Branch retinal vein occlusion; CRVO central retinal vein occlusion; CRAO central retinal artery occlusion; BRAO branch retinal artery occlusion; RT retinal tear; SB scleral buckle; PPV pars plana vitrectomy; VH Vitreous hemorrhage; PRP panretinal laser photocoagulation; IVK intravitreal kenalog; VMT vitreomacular traction; MH Macular hole;  NVD neovascularization of the disc; NVE neovascularization elsewhere; AREDS age related eye disease study; ARMD age related macular degeneration; POAG primary open angle glaucoma; EBMD epithelial/anterior basement membrane dystrophy; ACIOL anterior chamber intraocular lens; IOL intraocular lens; PCIOL posterior chamber intraocular lens; Phaco/IOL phacoemulsification with intraocular lens placement; [place] photorefractive keratectomy; LASIK laser assisted in situ keratomileusis; HTN hypertension; DM diabetes mellitus; COPD chronic obstructive pulmonary disease

## 2019-07-17 ENCOUNTER — Encounter (INDEPENDENT_AMBULATORY_CARE_PROVIDER_SITE_OTHER): Payer: Self-pay | Admitting: Ophthalmology

## 2019-07-17 ENCOUNTER — Other Ambulatory Visit: Payer: Self-pay

## 2019-07-17 ENCOUNTER — Ambulatory Visit (INDEPENDENT_AMBULATORY_CARE_PROVIDER_SITE_OTHER): Payer: BC Managed Care – PPO | Admitting: Ophthalmology

## 2019-07-17 DIAGNOSIS — H35033 Hypertensive retinopathy, bilateral: Secondary | ICD-10-CM | POA: Diagnosis not present

## 2019-07-17 DIAGNOSIS — H3581 Retinal edema: Secondary | ICD-10-CM | POA: Diagnosis not present

## 2019-07-17 DIAGNOSIS — H353221 Exudative age-related macular degeneration, left eye, with active choroidal neovascularization: Secondary | ICD-10-CM

## 2019-07-17 DIAGNOSIS — H353112 Nonexudative age-related macular degeneration, right eye, intermediate dry stage: Secondary | ICD-10-CM | POA: Diagnosis not present

## 2019-07-17 DIAGNOSIS — I10 Essential (primary) hypertension: Secondary | ICD-10-CM

## 2019-07-17 DIAGNOSIS — H3552 Pigmentary retinal dystrophy: Secondary | ICD-10-CM | POA: Diagnosis not present

## 2019-07-17 DIAGNOSIS — H25813 Combined forms of age-related cataract, bilateral: Secondary | ICD-10-CM

## 2019-07-17 MED ORDER — BEVACIZUMAB CHEMO INJECTION 1.25MG/0.05ML SYRINGE FOR KALEIDOSCOPE
1.2500 mg | INTRAVITREAL | Status: AC | PRN
  Administered 2019-07-17: 1.25 mg via INTRAVITREAL

## 2019-07-21 LAB — T PALLIDUM SCREENING CASCADE: T pallidum Antibodies (TP-PA): NONREACTIVE

## 2019-07-21 LAB — BARTONELLA ANTIBODY PANEL
B Quintana IgM: NEGATIVE titer
B henselae IgG: NEGATIVE titer
B henselae IgM: NEGATIVE titer
B quintana IgG: NEGATIVE titer

## 2019-07-21 LAB — FLUORESCENT TREPONEMAL AB(FTA)-IGG-BLD: Fluorescent Treponemal Ab, IgG: NONREACTIVE

## 2019-07-24 LAB — COMPREHENSIVE METABOLIC PANEL
ALT: 15 IU/L (ref 0–44)
AST: 16 IU/L (ref 0–40)
Albumin/Globulin Ratio: 1.3 (ref 1.2–2.2)
Albumin: 4.4 g/dL (ref 3.8–4.8)
Alkaline Phosphatase: 50 IU/L (ref 39–117)
BUN/Creatinine Ratio: 14 (ref 10–24)
BUN: 14 mg/dL (ref 8–27)
Bilirubin Total: 0.2 mg/dL (ref 0.0–1.2)
CO2: 24 mmol/L (ref 20–29)
Calcium: 9.5 mg/dL (ref 8.6–10.2)
Chloride: 104 mmol/L (ref 96–106)
Creatinine, Ser: 0.97 mg/dL (ref 0.76–1.27)
GFR calc Af Amer: 96 mL/min/{1.73_m2} (ref >59)
GFR calc non Af Amer: 83 mL/min/{1.73_m2} (ref >59)
Globulin, Total: 3.4 g/dL (ref 1.5–4.5)
Glucose: 111 mg/dL — ABNORMAL HIGH (ref 65–99)
Potassium: 5 mmol/L (ref 3.5–5.2)
Sodium: 138 mmol/L (ref 134–144)
Total Protein: 7.8 g/dL (ref 6.0–8.5)

## 2019-07-24 LAB — ANTINEUTROPHIL CYTOPLASMIC AB
Atypical pANCA: <1:20 {titer}
Cytoplasmic (C-ANCA): <1:20 {titer}
Perinuclear (P-ANCA): <1:20 {titer}

## 2019-07-24 LAB — QUANTIFERON-TB GOLD PLUS
QuantiFERON Mitogen Value: >10 IU/mL
QuantiFERON Nil Value: 0.04 IU/mL
QuantiFERON TB1 Ag Value: 0.06 IU/mL
QuantiFERON TB2 Ag Value: 0.06 IU/mL
QuantiFERON-TB Gold Plus: NEGATIVE

## 2019-07-24 LAB — CBC
Hematocrit: 49.7 % (ref 37.5–51.0)
Hemoglobin: 16.5 g/dL (ref 13.0–17.7)
MCH: 30.8 pg (ref 26.6–33.0)
MCHC: 33.2 g/dL (ref 31.5–35.7)
MCV: 93 fL (ref 79–97)
RBC: 5.36 x10E6/uL (ref 4.14–5.80)
RDW: 11.4 % — ABNORMAL LOW (ref 11.6–15.4)
WBC: 7.3 10*3/uL (ref 3.4–10.8)

## 2019-07-24 LAB — RPR: RPR Ser Ql: NONREACTIVE

## 2019-07-24 LAB — HLA A,B,C (IR)

## 2019-07-24 LAB — TOXOPLASMA ANTIBODIES- IGG AND  IGM
Toxoplasma Antibody- IgM: <3 AU/mL (ref 0.0–7.9)
Toxoplasma IgG Ratio: <3 IU/mL (ref 0.0–7.1)

## 2019-07-24 LAB — SEDIMENTATION RATE: Sed Rate: 21 mm/hr (ref 0–30)

## 2019-07-24 LAB — C-REACTIVE PROTEIN: CRP: 3 mg/L (ref 0–10)

## 2019-07-24 LAB — RHEUMATOID FACTOR: Rheumatoid fact SerPl-aCnc: <10 IU/mL (ref 0.0–13.9)

## 2019-07-24 LAB — ANA: Anti Nuclear Antibody (ANA): NEGATIVE

## 2019-07-24 LAB — ANGIOTENSIN CONVERTING ENZYME: Angio Convert Enzyme: 48 U/L (ref 14–82)

## 2019-08-12 NOTE — Progress Notes (Signed)
[facility] Note  08/14/2019     CHIEF COMPLAINT Patient presents for Retina Follow Up   HISTORY OF PRESENT ILLNESS: Eric Key is a 64 y.o. male who presents to the clinic today for:   HPI    Retina Follow Up    Patient presents with  Wet AMD.  In left eye.  This started 4 weeks ago.  Severity is moderate.          Comments    Patient here for 4 weeks retina follow up for exu ARMD OS. Patient states vision no difference.nothings changed. First injection didn't have any pain. This past injection could tell something happened.       Last edited by Theodore Demark, COA on 08/14/2019  7:57 AM. (History)    pt states he thinks the injections are helping his vision   Referring physician: [name], [name], MD [place],  [place] [id number]  HISTORICAL INFORMATION:   Selected notes from the MEDICAL RECORD NUMBER Referred by Dr. [name] for concern of mac heme OS.   CURRENT MEDICATIONS: No current outpatient medications on file. (Ophthalmic Drugs)   No current facility-administered medications for this visit. (Ophthalmic Drugs)   No current outpatient medications on file. (Other)   No current facility-administered medications for this visit. (Other)      REVIEW OF SYSTEMS: ROS    Positive for: Eyes   Negative for: Constitutional, Gastrointestinal, Neurological, Skin, Genitourinary, Musculoskeletal, HENT, Endocrine, Cardiovascular, Respiratory, Psychiatric, Allergic/Imm, Heme/Lymph   Last edited by Theodore Demark, COA on 08/14/2019  7:57 AM. (History)       ALLERGIES Allergies  Allergen Reactions  . Penicillins Hives    PAST MEDICAL HISTORY Past Medical History:  Diagnosis Date  . Hypertension    Past Surgical History:  Procedure Laterality Date  . ACHILLES TENDON REPAIR  2010  . GALLBLADDER SURGERY      FAMILY HISTORY Family History  Problem Relation Age of Onset  . Diabetes Brother   . Heart  disease Brother   . Diabetes Maternal Grandfather     SOCIAL HISTORY Social History   Tobacco Use  . Smoking status: Former Smoker    Quit date: 11/14/2005    Years since quitting: 13.7  . Smokeless tobacco: Never Used  Substance Use Topics  . Alcohol use: Yes    Comment: occ  . Drug use: Never         OPHTHALMIC EXAM:  Base Eye Exam    Visual Acuity (Snellen - Linear)      Right Left   Dist cc 20/30 +1 20/40 -2   Dist ph cc 20/25 +1 NI   Correction: Glasses       Tonometry (Tonopen, 7:54 AM)      Right Left   Pressure 14 13       Pupils      Dark Light Shape React APD   Right 4 3 Round Brisk None   Left 4 3 Round Brisk None       Visual Fields (Counting fingers)      Left Right    Full Full       Extraocular Movement      Right Left    Full, Ortho Full, Ortho       Neuro/Psych    Oriented x3: Yes   Mood/Affect: Normal       Dilation    Both eyes: 1.0% Mydriacyl, 2.5% Phenylephrine @  7:53 AM        Slit Lamp and Fundus Exam    Slit Lamp Exam      Right Left   Lids/Lashes Dermatochalasis - upper lid Dermatochalasis - upper lid   Conjunctiva/Sclera White and quiet White and quiet   Cornea mild Arcus, mild Debris in tear film mild Arcus, mild Debris in tear film, 1+ inferior Punctate epithelial erosions   Anterior Chamber Deep and quiet Deep and quiet   Iris Round and dilated Round and dilated   Lens 2+ Nuclear sclerosis, 2+ Cortical cataract 2+ Nuclear sclerosis, 2+ Cortical cataract   Vitreous Vitreous syneresis Vitreous syneresis       Fundus Exam      Right Left   Disc Pink and Sharp, focal PPP ST Pink and Sharp   C/D Ratio 0.2 0.3   Macula Flat, Drusen, RPE mottling and clumping, early Atrophy, punctate IRH Abnormal foveal reflex, +CNV with +IRH (slightly improved) SRH, +central edema -- persistent, Drusen, RPE mottling and clumping   Vessels Vascular attenuation, AV crossing changes, perivascular pigment clumping peripherally Vascular  attenuation, Tortuousity, perivascular pigment clumping peripherally   Periphery Attached, peripheral pigmented CR scarring and perivascular bone spicules 360 Attached, peripheral pigmented CR scarring and perivascular bone spicules 360        Refraction    Wearing Rx      Sphere Cylinder Axis   Right [place] +1.00 171   Left +0.25 +0.50 174          IMAGING AND PROCEDURES  Imaging and Procedures for _0 @  OCT, Retina - OU - Both Eyes       Right Eye Quality was borderline. Central Foveal Thickness: 314. Progression has been stable. Findings include normal foveal contour, no IRF, no SRF, retinal drusen , outer retinal atrophy, pigment epithelial detachment (Low lying PEDs temporal macula caught on widefield; partial PVD, vitreous opacities).   Left Eye Quality was good. Central Foveal Thickness: 584. Progression has worsened. Findings include abnormal foveal contour, intraretinal fluid, subretinal hyper-reflective material, pigment epithelial detachment, outer retinal atrophy, retinal drusen  (Mild Interval increase in IRF/IRHM).   Notes *Images captured and stored on drive  Diagnosis / Impression:  OD: non-exu ARMD  OS: exu ARMD -- Interval increase in IRF/IRHM  Clinical management:  See below  Abbreviations: NFP - Normal foveal profile. CME - cystoid macular edema. PED - pigment epithelial detachment. IRF - intraretinal fluid. SRF - subretinal fluid. EZ - ellipsoid zone. ERM - epiretinal membrane. ORA - outer retinal atrophy. ORT - outer retinal tubulation. SRHM - subretinal hyper-reflective material        Intravitreal Injection, Pharmacologic Agent - OS - Left Eye       Time Out 08/14/2019. 8:07 AM. Confirmed correct patient, procedure, site, and patient consented.   Anesthesia Topical anesthesia was used. Anesthetic medications included Lidocaine 2%, Proparacaine 0.5%.   Procedure Preparation included 5% betadine to ocular surface, eyelid speculum. A (32g)  needle was used.   Injection:  1.25 mg Bevacizumab (AVASTIN) SOLN   NDC: 62694-854-62, Lot: [id number] , Expiration date: 11/10/2019   Route: Intravitreal, Site: Left Eye, Waste: 0 mL  Post-op Post injection exam found visual acuity of at least counting fingers. The patient tolerated the procedure well. There were no complications. The patient received written and verbal post procedure care education.                 ASSESSMENT/PLAN:    ICD-10-CM   1. Exudative age-related macular degeneration of  left eye with active choroidal neovascularization (HCC)  H35.3221 Intravitreal Injection, Pharmacologic Agent - OS - Left Eye    Bevacizumab (AVASTIN) SOLN 1.25 mg  2. Retinal edema  H35.81 OCT, Retina - OU - Both Eyes  3. 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 Essential hypertension  I10   6. Hypertensive retinopathy of both eyes  H35.033   7. Combined forms of age-related cataract of both eyes  H25.813     1,2. 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, OS  - s/p IVA OS #1 (03.04.21), #2 (03.04.21)  - exam shows central CNV with +subretinal heme -- improved  - BCVA stable at 20/40 OS  - OCT shows CNV/PED w/ IRF and SRHM -- interval increase in IRF/IRHM  - discussed possibility IVA resistance and switching medications  - recommend IVA OS #3 today, 04.30.21  - pt wishes to be treated with IVA  - RBA of procedure discussed, questions answered  - informed consent obtained   - IVA consent form signed and scanned, 03.04.21  - see procedure note  - Eylea[employer]U benefits investigation started, 04.30.21  - f/u in 4 wks -- DFE/OCT, possible injection  3. Age related macular degeneration, non-exudative, OD  - The incidence, anatomy, and pathology of dry AMD, risk of progression, and the AREDS and AREDS 2 study including smoking risks discussed with patient.  - Recommend amsler grid monitoring  4.  Pigmentary retinopathy OU  - peripheral pigmented CR scarring and perivascular pigment clumping / bone spicules OU  - VA remains fairly good (20/20 OD, 20/40 OS with exudative ARMD OS)  - denies night blindness  - denies family history of vision problems  - discussed findings  - broad differential for pigmentary retinopathy:  Congenital (RP), infectious (TB, lyme dx, syphilis, toxo, bartonella), autoimmune and toxic etiologies  - labs checked (4.2.21):  All essential WNL   Rheumatoid factor   CBC   Comprehensive metabolic panel     RPR   ANA   ESR, CRP   ACE   ANCA   Toxoplasma antibodies IgG / IgM   Quantiferon - TB Gold Plus   HLA, A, B, C (IR)   Fluorescent treponemal ab (fla) - IgG - bld   VDRL Serum   Bartonella antibody  - f/u in 4 wks as above -- will check [facility] testing -- new kits ordered today  5,6. Hypertensive retinopathy OU  - discussed importance of tight BP control  - monitor  7. Mixed form age related cataracts OU  - The symptoms of cataract, surgical options, and treatments and risks were discussed with patient.  - discussed diagnosis and progression  - not yet visually significant  - monitor for now    Ophthalmic Meds Ordered this visit:  Meds ordered this encounter  Medications  . Bevacizumab (AVASTIN) SOLN 1.25 mg       Return in about 4 weeks (around 09/11/2019) for f/u exu ARMD OS, DFE, OCT.  There are no Patient Instructions on file for this visit.   Explained the diagnoses, plan, and follow up with the patient and they expressed understanding.  Patient expressed understanding of the importance of proper follow up care.   This document serves as a record of services personally performed by Gardiner Sleeper, MD, PhD. It was created on their behalf by Ernest Mallick, OA, an ophthalmic assistant. The creation of this record is the provider's dictation and/or  activities during the visit.    Electronically signed by: Ernest Mallick, OA  04.28.2021 11:56 AM   Gardiner Sleeper, M.D., Ph.D. Diseases & Surgery of the Retina and Vitreous [facility]  I have reviewed the above documentation for accuracy and completeness, and I agree with the above. Gardiner Sleeper, M.D., Ph.D. 08/14/19 11:56 AM    Abbreviations: M myopia (nearsighted); A astigmatism; H hyperopia (farsighted); P presbyopia; Mrx spectacle prescription;  CTL contact lenses; OD right eye; OS left eye; OU both eyes  XT exotropia; ET esotropia; PEK punctate epithelial keratitis; PEE punctate epithelial erosions; DES dry eye syndrome; MGD meibomian gland dysfunction; ATs artificial tears; PFAT's preservative free artificial tears; [place] nuclear sclerotic cataract; PSC posterior subcapsular cataract; ERM epi-retinal membrane; PVD posterior vitreous detachment; RD retinal detachment; DM diabetes mellitus; DR diabetic retinopathy; NPDR non-proliferative diabetic retinopathy; PDR proliferative diabetic retinopathy; CSME clinically significant macular edema; DME diabetic macular edema; dbh dot blot hemorrhages; CWS cotton wool spot; POAG primary open angle glaucoma; C/D cup-to-disc ratio; HVF humphrey visual field; GVF goldmann visual field; OCT optical coherence tomography; IOP intraocular pressure; BRVO Branch retinal vein occlusion; CRVO central retinal vein occlusion; CRAO central retinal artery occlusion; BRAO branch retinal artery occlusion; RT retinal tear; SB scleral buckle; PPV pars plana vitrectomy; VH Vitreous hemorrhage; PRP panretinal laser photocoagulation; IVK intravitreal kenalog; VMT vitreomacular traction; MH Macular hole;  NVD neovascularization of the disc; NVE neovascularization elsewhere; AREDS age related eye disease study; ARMD age related macular degeneration; POAG primary open angle glaucoma; EBMD epithelial/anterior basement membrane dystrophy; ACIOL anterior chamber intraocular lens; IOL intraocular lens; PCIOL posterior chamber intraocular  lens; Phaco/IOL phacoemulsification with intraocular lens placement; [place] photorefractive keratectomy; LASIK laser assisted in situ keratomileusis; HTN hypertension; DM diabetes mellitus; COPD chronic obstructive pulmonary disease

## 2019-08-14 ENCOUNTER — Other Ambulatory Visit: Payer: Self-pay

## 2019-08-14 ENCOUNTER — Ambulatory Visit (INDEPENDENT_AMBULATORY_CARE_PROVIDER_SITE_OTHER): Payer: BC Managed Care – PPO | Admitting: Ophthalmology

## 2019-08-14 ENCOUNTER — Encounter (INDEPENDENT_AMBULATORY_CARE_PROVIDER_SITE_OTHER): Payer: Self-pay | Admitting: Ophthalmology

## 2019-08-14 DIAGNOSIS — I10 Essential (primary) hypertension: Secondary | ICD-10-CM

## 2019-08-14 DIAGNOSIS — H353112 Nonexudative age-related macular degeneration, right eye, intermediate dry stage: Secondary | ICD-10-CM

## 2019-08-14 DIAGNOSIS — H3552 Pigmentary retinal dystrophy: Secondary | ICD-10-CM | POA: Diagnosis not present

## 2019-08-14 DIAGNOSIS — H3581 Retinal edema: Secondary | ICD-10-CM | POA: Diagnosis not present

## 2019-08-14 DIAGNOSIS — H25813 Combined forms of age-related cataract, bilateral: Secondary | ICD-10-CM

## 2019-08-14 DIAGNOSIS — H353221 Exudative age-related macular degeneration, left eye, with active choroidal neovascularization: Secondary | ICD-10-CM

## 2019-08-14 DIAGNOSIS — H35033 Hypertensive retinopathy, bilateral: Secondary | ICD-10-CM

## 2019-08-14 MED ORDER — BEVACIZUMAB CHEMO INJECTION 1.25MG/0.05ML SYRINGE FOR KALEIDOSCOPE
1.2500 mg | INTRAVITREAL | Status: AC | PRN
  Administered 2019-08-14: 1.25 mg via INTRAVITREAL

## 2019-09-11 ENCOUNTER — Encounter (INDEPENDENT_AMBULATORY_CARE_PROVIDER_SITE_OTHER): Payer: BC Managed Care – PPO | Admitting: Ophthalmology

## 2019-09-17 NOTE — Progress Notes (Addendum)
[facility] Note  09/18/2019     CHIEF COMPLAINT Patient presents for Retina Follow Up   HISTORY OF PRESENT ILLNESS: Eric Key is a 64 y.o. male who presents to the clinic today for:   HPI    Retina Follow Up    Patient presents with  Wet AMD.  In left eye.  This started months ago.  Severity is moderate.  Duration of 5 weeks.  Since onset it is stable.  I, the attending physician,  performed the HPI with the patient and updated documentation appropriately.          Comments    64 y/o male pt here for 5 wk f/u for exu ARMD OS.  No change in [place] OU.  Denies pain, FOL, floaters.  No gtts.       Last edited by [name], MD on 09/18/2019  9:03 AM. (History)    pt states he doesn't feel like his vision is getting worse, he wants to do genetic testing today   Referring physician: [name], [name], MD [place],  [place] [id number]  HISTORICAL INFORMATION:   Selected notes from the MEDICAL RECORD NUMBER Referred by Dr. [name] for concern of mac heme OS.   CURRENT MEDICATIONS: No current outpatient medications on file. (Ophthalmic Drugs)   No current facility-administered medications for this visit. (Ophthalmic Drugs)   No current outpatient medications on file. (Other)   No current facility-administered medications for this visit. (Other)      REVIEW OF SYSTEMS: ROS    Positive for: Eyes   Negative for: Constitutional, Gastrointestinal, Neurological, Skin, Genitourinary, Musculoskeletal, HENT, Endocrine, Cardiovascular, Respiratory, Psychiatric, Allergic/Imm, Heme/Lymph   Last edited by Matthew Folks, COA on 09/18/2019  7:52 AM. (History)       ALLERGIES Allergies  Allergen Reactions  . Penicillins Hives    PAST MEDICAL HISTORY Past Medical History:  Diagnosis Date  . Cataract    Mixed form OU  . Hypertension   . Hypertensive retinopathy    OU  . Macular degeneration    Dry OD; Wet OS   Past  Surgical History:  Procedure Laterality Date  . ACHILLES TENDON REPAIR  2010  . GALLBLADDER SURGERY      FAMILY HISTORY Family History  Problem Relation Age of Onset  . Diabetes Brother   . Heart disease Brother   . Diabetes Maternal Grandfather     SOCIAL HISTORY Social History   Tobacco Use  . Smoking status: Former Smoker    Quit date: 11/14/2005    Years since quitting: 13.8  . Smokeless tobacco: Never Used  Substance Use Topics  . Alcohol use: Yes    Comment: occ  . Drug use: Never         OPHTHALMIC EXAM:  Base Eye Exam    Visual Acuity (Snellen - Linear)      Right Left   Dist cc 20/20 -2 20/40   Dist ph cc  NI   Correction: Glasses       Tonometry (Tonopen, 7:54 AM)      Right Left   Pressure 12 13       Pupils      Dark Light Shape React APD   Right 4 3 Round Brisk None   Left 4 3 Round Brisk None       Visual Fields (Counting fingers)      Left Right    Full Full  Extraocular Movement      Right Left    Full, Ortho Full, Ortho       Neuro/Psych    Oriented x3: Yes   Mood/Affect: Normal       Dilation    Both eyes: 1.0% Mydriacyl, 2.5% Phenylephrine @ 7:54 AM        Slit Lamp and Fundus Exam    Slit Lamp Exam      Right Left   Lids/Lashes Dermatochalasis - upper lid Dermatochalasis - upper lid   Conjunctiva/Sclera White and quiet White and quiet   Cornea mild Arcus, mild Debris in tear film mild Arcus, mild Debris in tear film, 1+ inferior Punctate epithelial erosions   Anterior Chamber Deep and quiet Deep and quiet   Iris Round and dilated Round and dilated   Lens 2+ Nuclear sclerosis, 2+ Cortical cataract 2+ Nuclear sclerosis, 2+ Cortical cataract   Vitreous Vitreous syneresis Vitreous syneresis       Fundus Exam      Right Left   Disc Pink and Sharp, focal PPP ST Pink and Sharp   C/D Ratio 0.2 0.3   Macula Flat, Drusen, RPE mottling and clumping, early Atrophy, punctate IRH Abnormal foveal reflex, +CNV with +IRH  (slightly improved) SRH, +central edema -- persistent, Drusen, RPE mottling and clumping   Vessels Vascular attenuation, AV crossing changes, perivascular pigment clumping peripherally Vascular attenuation, Tortuousity, perivascular pigment clumping peripherally   Periphery Attached, peripheral pigmented CR scarring and perivascular bone spicules 360 Attached, peripheral pigmented CR scarring and perivascular bone spicules 360          IMAGING AND PROCEDURES  Imaging and Procedures for _0 @  OCT, Retina - OU - Both Eyes       Right Eye Quality was good. Central Foveal Thickness: 310. Progression has been stable. Findings include normal foveal contour, no IRF, no SRF, retinal drusen , outer retinal atrophy, pigment epithelial detachment (Low lying PEDs temporal macula caught on widefield; partial PVD, vitreous opacities).   Left Eye Quality was good. Central Foveal Thickness: 653. Progression has worsened. Findings include abnormal foveal contour, intraretinal fluid, subretinal hyper-reflective material, pigment epithelial detachment, outer retinal atrophy, retinal drusen  (Interval increase in IRF/SRHM).   Notes *Images captured and stored on drive  Diagnosis / Impression:  OD: non-exu ARMD  OS: exu ARMD -- Interval increase in IRF/SRHM  Clinical management:  See below  Abbreviations: NFP - Normal foveal profile. CME - cystoid macular edema. PED - pigment epithelial detachment. IRF - intraretinal fluid. SRF - subretinal fluid. EZ - ellipsoid zone. ERM - epiretinal membrane. ORA - outer retinal atrophy. ORT - outer retinal tubulation. SRHM - subretinal hyper-reflective material        Intravitreal Injection, Pharmacologic Agent - OS - Left Eye       Time Out 09/18/2019. 8:44 AM. Confirmed correct patient, procedure, site, and patient consented.   Anesthesia Topical anesthesia was used. Anesthetic medications included Lidocaine 2%, Proparacaine 0.5%.    Procedure Preparation included 5% betadine to ocular surface, eyelid speculum. A (32g) needle was used.   Injection:  2 mg aflibercept Alfonse Flavors) SOLN   NDC: [id number], Lot: [id number], Expiration date: 01/11/2020   Route: Intravitreal, Site: Left Eye, Waste: 0.05 mL  Post-op Post injection exam found visual acuity of at least counting fingers. The patient tolerated the procedure well. There were no complications. The patient received written and verbal post procedure care education.  ASSESSMENT/PLAN:    ICD-10-CM   1. Exudative age-related macular degeneration of left eye with active choroidal neovascularization (HCC)  H35.3221 Intravitreal Injection, Pharmacologic Agent - OS - Left Eye    aflibercept (EYLEA) SOLN 2 mg  2. Retinal edema  H35.81 OCT, Retina - OU - Both Eyes  3. 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 Essential hypertension  I10   6. Hypertensive retinopathy of both eyes  H35.033   7. Combined forms of age-related cataract of both eyes  H25.813     1,2. 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, OS  - s/p IVA OS #1 (03.04.21), #2 (03.04.21), #3 (04.30.21)  - exam shows central CNV with +subretinal heme -- improved  - BCVA stable at 20/40 OS  - OCT shows CNV/PED w/ IRF and SRHM -- interval increase in IRF/IRHM  - discussed possibility IVA resistance and switching medications  - recommend IVE OS #1 today, 06.04.21  - pt wishes to be treated with IVE  - RBA of procedure discussed, questions answered  - informed consent obtained   - IVA consent form signed and scanned, 03.04.21  - IVE consent form signed and scanned, 06.04.21  - see procedure note  - Eylea[employer] benefits investigation started, 04.30.21 -- approved as of 06.04.21  - f/u in 4-5 wks -- DFE/OCT, possible injection  3. Age related macular degeneration, non-exudative, OD  - The incidence,  anatomy, and pathology of dry AMD, risk of progression, and the AREDS and AREDS 2 study including smoking risks discussed with patient.  - Recommend amsler grid monitoring  4. Pigmentary retinopathy OU  - peripheral pigmented CR scarring and perivascular pigment clumping / bone spicules OU  - VA remains fairly good (20/20 OD, 20/40 OS with exudative ARMD OS)  - denies night blindness  - denies family history of vision problems  - discussed findings  - broad differential for pigmentary retinopathy:  Congenital (RP), infectious (TB, lyme dx, syphilis, toxo, bartonella), autoimmune and toxic etiologies  - labs checked (4.2.21):  All essential WNL   Rheumatoid factor   CBC   Comprehensive metabolic panel     RPR   ANA   ESR, CRP   ACE   ANCA   Toxoplasma antibodies IgG / IgM   Quantiferon - TB Gold Plus   HLA, A, B, C (IR)   Fluorescent treponemal ab (fla) - IgG - bld   VDRL Serum   Bartonella antibody, 06.04.21  - discussed [facility] -- pt wishes to undergo testing via blood sample -- drawn today  - f/u 4-5 weeks  5,6. Hypertensive retinopathy OU  - discussed importance of tight BP control  - monitor  7. Mixed form age related cataracts OU  - The symptoms of cataract, surgical options, and treatments and risks were discussed with patient.  - discussed diagnosis and progression  - not yet visually significant  - monitor for now    Ophthalmic Meds Ordered this visit:  Meds ordered this encounter  Medications  . aflibercept (EYLEA) SOLN 2 mg       Return for f/u 4-5 weeks, exu ARMD OS, DFE.  There are no Patient Instructions on file for this visit.   Explained the diagnoses, plan, and follow up with the patient and they expressed understanding.  Patient expressed understanding of the importance of proper follow up care.   This document serves as a record of services personally performed  by Gardiner Sleeper, MD, PhD. It was created on their behalf by Ernest Mallick, OA, an ophthalmic assistant. The creation of this record is the provider's dictation and/or activities during the visit.    Electronically signed by: Ernest Mallick, OA 06.03.2021 9:35 AM   Gardiner Sleeper, M.D., Ph.D. Diseases & Surgery of the Retina and Vitreous [facility]  I have reviewed the above documentation for accuracy and completeness, and I agree with the above. Gardiner Sleeper, M.D., Ph.D. 09/18/19 9:35 AM   Abbreviations: M myopia (nearsighted); A astigmatism; H hyperopia (farsighted); P presbyopia; Mrx spectacle prescription;  CTL contact lenses; OD right eye; OS left eye; OU both eyes  XT exotropia; ET esotropia; PEK punctate epithelial keratitis; PEE punctate epithelial erosions; DES dry eye syndrome; MGD meibomian gland dysfunction; ATs artificial tears; PFAT's preservative free artificial tears; [place] nuclear sclerotic cataract; PSC posterior subcapsular cataract; ERM epi-retinal membrane; PVD posterior vitreous detachment; RD retinal detachment; DM diabetes mellitus; DR diabetic retinopathy; NPDR non-proliferative diabetic retinopathy; PDR proliferative diabetic retinopathy; CSME clinically significant macular edema; DME diabetic macular edema; dbh dot blot hemorrhages; CWS cotton wool spot; POAG primary open angle glaucoma; C/D cup-to-disc ratio; HVF humphrey visual field; GVF goldmann visual field; OCT optical coherence tomography; IOP intraocular pressure; BRVO Branch retinal vein occlusion; CRVO central retinal vein occlusion; CRAO central retinal artery occlusion; BRAO branch retinal artery occlusion; RT retinal tear; SB scleral buckle; PPV pars plana vitrectomy; VH Vitreous hemorrhage; PRP panretinal laser photocoagulation; IVK intravitreal kenalog; VMT vitreomacular traction; MH Macular hole;  NVD neovascularization of the disc; NVE neovascularization elsewhere; AREDS age related eye disease study; ARMD age related macular degeneration; POAG primary  open angle glaucoma; EBMD epithelial/anterior basement membrane dystrophy; ACIOL anterior chamber intraocular lens; IOL intraocular lens; PCIOL posterior chamber intraocular lens; Phaco/IOL phacoemulsification with intraocular lens placement; [place] photorefractive keratectomy; LASIK laser assisted in situ keratomileusis; HTN hypertension; DM diabetes mellitus; COPD chronic obstructive pulmonary disease

## 2019-09-18 ENCOUNTER — Encounter (INDEPENDENT_AMBULATORY_CARE_PROVIDER_SITE_OTHER): Payer: Self-pay | Admitting: Ophthalmology

## 2019-09-18 ENCOUNTER — Other Ambulatory Visit: Payer: Self-pay

## 2019-09-18 ENCOUNTER — Ambulatory Visit (INDEPENDENT_AMBULATORY_CARE_PROVIDER_SITE_OTHER): Payer: BC Managed Care – PPO | Admitting: Ophthalmology

## 2019-09-18 DIAGNOSIS — H3581 Retinal edema: Secondary | ICD-10-CM

## 2019-09-18 DIAGNOSIS — H25813 Combined forms of age-related cataract, bilateral: Secondary | ICD-10-CM

## 2019-09-18 DIAGNOSIS — H353112 Nonexudative age-related macular degeneration, right eye, intermediate dry stage: Secondary | ICD-10-CM | POA: Diagnosis not present

## 2019-09-18 DIAGNOSIS — H3552 Pigmentary retinal dystrophy: Secondary | ICD-10-CM

## 2019-09-18 DIAGNOSIS — H353221 Exudative age-related macular degeneration, left eye, with active choroidal neovascularization: Secondary | ICD-10-CM | POA: Diagnosis not present

## 2019-09-18 DIAGNOSIS — I10 Essential (primary) hypertension: Secondary | ICD-10-CM

## 2019-09-18 DIAGNOSIS — H35033 Hypertensive retinopathy, bilateral: Secondary | ICD-10-CM

## 2019-09-18 MED ORDER — AFLIBERCEPT 2MG/0.05ML IZ SOLN FOR KALEIDOSCOPE
2.0000 mg | INTRAVITREAL | Status: AC | PRN
  Administered 2019-09-18: 2 mg via INTRAVITREAL

## 2019-10-15 NOTE — Progress Notes (Signed)
[facility] Note  10/22/2019     CHIEF COMPLAINT Patient presents for No chief complaint on file.   HISTORY OF PRESENT ILLNESS: Eric Key is a 64 y.o. male who presents to the clinic today for:   pt states he doesn't feel like his vision is getting worse, he wants to do genetic testing today   Referring physician: [name], [name], MD [place],  [place] [address]  HISTORICAL INFORMATION:   Selected notes from the [place] Referred by Dr. [name] for concern of mac heme OS.   CURRENT MEDICATIONS: No current outpatient medications on file. (Ophthalmic Drugs)   No current facility-administered medications for this visit. (Ophthalmic Drugs)   No current outpatient medications on file. (Other)   No current facility-administered medications for this visit. (Other)      REVIEW OF SYSTEMS:    ALLERGIES Allergies  Allergen Reactions  . Penicillins Hives    PAST MEDICAL HISTORY Past Medical History:  Diagnosis Date  . Cataract    Mixed form OU  . Hypertension   . Hypertensive retinopathy    OU  . Macular degeneration    Dry OD; Wet OS   Past Surgical History:  Procedure Laterality Date  . ACHILLES TENDON REPAIR  2010  . GALLBLADDER SURGERY      FAMILY HISTORY Family History  Problem Relation Age of Onset  . Diabetes Brother   . Heart disease Brother   . Diabetes Maternal Grandfather     SOCIAL HISTORY Social History   Tobacco Use  . Smoking status: Former Smoker    Quit date: 11/14/2005    Years since quitting: 13.9  . Smokeless tobacco: Never Used  Vaping Use  . Vaping Use: Never used  Substance Use Topics  . Alcohol use: Yes    Comment: occ  . Drug use: Never         OPHTHALMIC EXAM:  Not recorded     IMAGING AND PROCEDURES  Imaging and Procedures for _0 @           ASSESSMENT/PLAN:  No diagnosis found.  1,2. 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,  OS  - s/p IVA OS #1 (03.04.21), #2 (03.04.21), #3 (04.30.21)  - exam shows central CNV with +subretinal heme -- improved  - BCVA stable at 20/40 OS  - OCT shows CNV/PED w/ IRF and SRHM -- interval increase in IRF/IRHM  - discussed possibility IVA resistance and switching medications  - recommend IVE OS #1 today, 06.04.21  - pt wishes to be treated with IVE  - RBA of procedure discussed, questions answered  - informed consent obtained   - IVA consent form signed and scanned, 03.04.21  - IVE consent form signed and scanned, 06.04.21  - see procedure note  - [employer] benefits investigation started, 04.30.21 -- approved as of 06.04.21  - f/u in 4-5 wks -- DFE/OCT, possible injection  3. Age related macular degeneration, non-exudative, OD  - The incidence, anatomy, and pathology of dry AMD, risk of progression, and the AREDS and AREDS 2 study including smoking risks discussed with patient.  - Recommend amsler grid monitoring  4. Pigmentary retinopathy OU  - peripheral pigmented CR scarring and perivascular pigment clumping / bone spicules OU  - VA remains fairly good (20/20 OD, 20/40 OS with exudative ARMD OS)  - denies night blindness  - denies family history of vision problems  - discussed findings  - broad differential for  pigmentary retinopathy:  Congenital (RP), infectious (TB, lyme dx, syphilis, toxo, bartonella), autoimmune and toxic etiologies  - labs checked (4.2.21):  All essential WNL   Rheumatoid factor   CBC   Comprehensive metabolic panel     RPR   ANA   ESR, CRP   ACE   ANCA   Toxoplasma antibodies IgG / IgM   Quantiferon - TB Gold Plus   HLA, A, B, C (IR)   Fluorescent treponemal ab (fla) - IgG - bld   VDRL Serum   Bartonella antibody, 06.04.21  - discussed [facility] -- pt wishes to undergo testing via blood sample -- drawn today  - f/u 4-5 weeks  5,6. Hypertensive retinopathy OU  - discussed importance of tight BP control  - monitor  7. 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  - discussed diagnosis and progression  - not yet visually significant  - monitor for now    Ophthalmic Meds Ordered this visit:  No orders of the defined types were placed in this encounter.      No follow-ups on file.  There are no Patient Instructions on file for this visit.   Explained the diagnoses, plan, and follow up with the patient and they expressed understanding.  Patient expressed understanding of the importance of proper follow up care.   This document serves as a record of services personally performed by Gardiner Sleeper, MD, PhD. It was created on their behalf by Ernest Mallick, OA, an ophthalmic assistant. The creation of this record is the provider's dictation and/or activities during the visit.    Electronically signed by: Ernest Mallick, OA 06.03.2021 10:01 AM   Gardiner Sleeper, M.D., Ph.D. Diseases & Surgery of the Retina and Vitreous [facility]  I have reviewed the above documentation for accuracy and completeness, and I agree with the above. Gardiner Sleeper, M.D., Ph.D. 09/18/19 10:01 AM   Abbreviations: M myopia (nearsighted); A astigmatism; H hyperopia (farsighted); P presbyopia; Mrx spectacle prescription;  CTL contact lenses; OD right eye; OS left eye; OU both eyes  XT exotropia; ET esotropia; PEK punctate epithelial keratitis; PEE punctate epithelial erosions; DES dry eye syndrome; MGD meibomian gland dysfunction; ATs artificial tears; PFAT's preservative free artificial tears; [place] nuclear sclerotic cataract; PSC posterior subcapsular cataract; ERM epi-retinal membrane; PVD posterior vitreous detachment; RD retinal detachment; DM diabetes mellitus; DR diabetic retinopathy; NPDR non-proliferative diabetic retinopathy; PDR proliferative diabetic retinopathy; CSME clinically significant macular edema; DME  diabetic macular edema; dbh dot blot hemorrhages; CWS cotton wool spot; POAG primary open angle glaucoma; C/D cup-to-disc ratio; HVF humphrey visual field; GVF goldmann visual field; OCT optical coherence tomography; IOP intraocular pressure; BRVO Branch retinal vein occlusion; CRVO central retinal vein occlusion; CRAO central retinal artery occlusion; BRAO branch retinal artery occlusion; RT retinal tear; SB scleral buckle; PPV pars plana vitrectomy; VH Vitreous hemorrhage; PRP panretinal laser photocoagulation; IVK intravitreal kenalog; VMT vitreomacular traction; MH Macular hole;  NVD neovascularization of the disc; NVE neovascularization elsewhere; AREDS age related eye disease study; ARMD age related macular degeneration; POAG primary open angle glaucoma; EBMD epithelial/anterior basement membrane dystrophy; ACIOL anterior chamber intraocular lens; IOL intraocular lens; PCIOL posterior chamber intraocular lens; Phaco/IOL phacoemulsification with intraocular lens placement; [place] photorefractive keratectomy; LASIK laser assisted in situ keratomileusis; HTN hypertension; DM diabetes mellitus; COPD chronic obstructive pulmonary disease  [facility] -  Clinic Note  10/22/2019     CHIEF COMPLAINT Patient presents for No chief complaint on file.   HISTORY OF PRESENT ILLNESS: Eric Key is a 64 y.o. male who presents to the clinic today for:   pt states he doesn't feel like his vision is getting worse, he wants to do genetic testing today   Referring physician: [name], [name], MD [place],  [place] [address]  HISTORICAL INFORMATION:   Selected notes from the [place] Referred by Dr. [name] for concern of mac heme OS.   CURRENT MEDICATIONS: No current outpatient medications on file. (Ophthalmic Drugs)   No current facility-administered medications for this visit. (Ophthalmic Drugs)   No current outpatient medications on file. (Other)    No current facility-administered medications for this visit. (Other)      REVIEW OF SYSTEMS:    ALLERGIES Allergies  Allergen Reactions  . Penicillins Hives    PAST MEDICAL HISTORY Past Medical History:  Diagnosis Date  . Cataract    Mixed form OU  . Hypertension   . Hypertensive retinopathy    OU  . Macular degeneration    Dry OD; Wet OS   Past Surgical History:  Procedure Laterality Date  . ACHILLES TENDON REPAIR  2010  . GALLBLADDER SURGERY      FAMILY HISTORY Family History  Problem Relation Age of Onset  . Diabetes Brother   . Heart disease Brother   . Diabetes Maternal Grandfather     SOCIAL HISTORY Social History   Tobacco Use  . Smoking status: Former Smoker    Quit date: 11/14/2005    Years since quitting: 13.9  . Smokeless tobacco: Never Used  Vaping Use  . Vaping Use: Never used  Substance Use Topics  . Alcohol use: Yes    Comment: occ  . Drug use: Never         OPHTHALMIC EXAM:  Not recorded     IMAGING AND PROCEDURES  Imaging and Procedures for _0 @           ASSESSMENT/PLAN:  No diagnosis found.  1,2. 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, OS  - s/p IVA OS #1 (03.04.21), #2 (03.04.21), #3 (04.30.21)  - exam shows central CNV with +subretinal heme -- improved  - BCVA stable at 20/40 OS  - OCT shows CNV/PED w/ IRF and SRHM -- interval increase in IRF/IRHM  - discussed possibility IVA resistance and switching medications  - recommend IVE OS #1 today, 06.04.21  - pt wishes to be treated with IVE  - RBA of procedure discussed, questions answered  - informed consent obtained   - IVA consent form signed and scanned, 03.04.21  - IVE consent form signed and scanned, 06.04.21  - see procedure note  - [employer] benefits investigation started, 04.30.21 -- approved as of 06.04.21  - f/u in 4-5 wks -- DFE/OCT, possible injection  3. Age related macular degeneration, non-exudative, OD  - The incidence, anatomy, and  pathology of dry AMD, risk of progression, and the AREDS and AREDS 2 study including smoking risks discussed with patient.  - Recommend amsler grid monitoring  4. Pigmentary retinopathy OU  - peripheral pigmented CR scarring and perivascular pigment clumping / bone spicules OU  - VA remains fairly good (20/20 OD, 20/40 OS with exudative ARMD OS)  - denies night blindness  - denies family history of vision problems  - discussed findings  - broad differential for pigmentary retinopathy:  Congenital (RP), infectious (  TB, lyme dx, syphilis, toxo, bartonella), autoimmune and toxic etiologies  - labs checked (4.2.21):  All essential WNL   Rheumatoid factor   CBC   Comprehensive metabolic panel     RPR   ANA   ESR, CRP   ACE   ANCA   Toxoplasma antibodies IgG / IgM   Quantiferon - TB Gold Plus   HLA, A, B, C (IR)   Fluorescent treponemal ab (fla) - IgG - bld   VDRL Serum   Bartonella antibody, 06.04.21  - discussed [facility] -- pt wishes to undergo testing via blood sample -- drawn today  - f/u 4-5 weeks  5,6. Hypertensive retinopathy OU  - discussed importance of tight BP control  - monitor  7. Mixed form age related cataracts OU  - The symptoms of cataract, surgical options, and treatments and risks were discussed with patient.  - discussed diagnosis and progression  - not yet visually significant  - monitor for now    Ophthalmic Meds Ordered this visit:  No orders of the defined types were placed in this encounter.      No follow-ups on file.  There are no Patient Instructions on file for this visit.   Explained the diagnoses, plan, and follow up with the patient and they expressed understanding.  Patient expressed understanding of the importance of proper follow up care.   This document serves as a record of services personally performed by Gardiner Sleeper, MD, PhD. It was created on their behalf by Ernest Mallick, OA, an ophthalmic assistant. The creation  of this record is the provider's dictation and/or activities during the visit.    Electronically signed by: Ernest Mallick, OA 06.03.2021 10:01 AM   Gardiner Sleeper, M.D., Ph.D. Diseases & Surgery of the Retina and Vitreous [facility]  I have reviewed the above documentation for accuracy and completeness, and I agree with the above. Gardiner Sleeper, M.D., Ph.D. 09/18/19 10:01 AM   Abbreviations: M myopia (nearsighted); A astigmatism; H hyperopia (farsighted); P presbyopia; Mrx spectacle prescription;  CTL contact lenses; OD right eye; OS left eye; OU both eyes  XT exotropia; ET esotropia; PEK punctate epithelial keratitis; PEE punctate epithelial erosions; DES dry eye syndrome; MGD meibomian gland dysfunction; ATs artificial tears; PFAT's preservative free artificial tears; [place] nuclear sclerotic cataract; PSC posterior subcapsular cataract; ERM epi-retinal membrane; PVD posterior vitreous detachment; RD retinal detachment; DM diabetes mellitus; DR diabetic retinopathy; NPDR non-proliferative diabetic retinopathy; PDR proliferative diabetic retinopathy; CSME clinically significant macular edema; DME diabetic macular edema; dbh dot blot hemorrhages; CWS cotton wool spot; POAG primary open angle glaucoma; C/D cup-to-disc ratio; HVF humphrey visual field; GVF goldmann visual field; OCT optical coherence tomography; IOP intraocular pressure; BRVO Branch retinal vein occlusion; CRVO central retinal vein occlusion; CRAO central retinal artery occlusion; BRAO branch retinal artery occlusion; RT retinal tear; SB scleral buckle; PPV pars plana vitrectomy; VH Vitreous hemorrhage; PRP panretinal laser photocoagulation; IVK intravitreal kenalog; VMT vitreomacular traction; MH Macular hole;  NVD neovascularization of the disc; NVE neovascularization elsewhere; AREDS age related eye disease study; ARMD age related macular degeneration; POAG primary open angle glaucoma; EBMD epithelial/anterior  basement membrane dystrophy; ACIOL anterior chamber intraocular lens; IOL intraocular lens; PCIOL posterior chamber intraocular lens; Phaco/IOL phacoemulsification with intraocular lens placement; [place] photorefractive keratectomy; LASIK laser assisted in situ keratomileusis; HTN hypertension; DM diabetes mellitus; COPD chronic obstructive pulmonary disease

## 2019-10-15 NOTE — Progress Notes (Signed)
[facility] Note  10/22/2019     CHIEF COMPLAINT Patient presents for Retina Follow Up   HISTORY OF PRESENT ILLNESS: Eric Key is a 64 y.o. male who presents to the clinic today for:   HPI    Retina Follow Up    Patient presents with  Wet AMD.  In left eye.  This started weeks ago.  Severity is moderate.  Duration of weeks.  Since onset it is gradually improving.  I, the attending physician,  performed the HPI with the patient and updated documentation appropriately.          Comments    Pt states vision is improved OS.  Pt denies eye pain or discomfort and denies any new or worsening floaters or fol OU.       Last edited by [name], MD on 10/22/2019  8:26 AM. (History)    pt feels like his vision has improved since last injection   Referring physician: [name], [name], MD [place],  [place] [address]  HISTORICAL INFORMATION:   Selected notes from the MEDICAL RECORD NUMBER Referred by Dr. [name] for concern of mac heme OS.   CURRENT MEDICATIONS: No current outpatient medications on file. (Ophthalmic Drugs)   No current facility-administered medications for this visit. (Ophthalmic Drugs)   No current outpatient medications on file. (Other)   No current facility-administered medications for this visit. (Other)      REVIEW OF SYSTEMS: ROS    Positive for: Eyes   Negative for: Constitutional, Gastrointestinal, Neurological, Skin, Genitourinary, Musculoskeletal, HENT, Endocrine, Cardiovascular, Respiratory, Psychiatric, Allergic/Imm, Heme/Lymph   Last edited by Doneen Poisson on 10/22/2019  7:55 AM. (History)       ALLERGIES Allergies  Allergen Reactions  . Penicillins Hives    PAST MEDICAL HISTORY Past Medical History:  Diagnosis Date  . Cataract    Mixed form OU  . Hypertension   . Hypertensive retinopathy    OU  . Macular degeneration    Dry OD; Wet OS   Past Surgical History:  Procedure  Laterality Date  . ACHILLES TENDON REPAIR  2010  . GALLBLADDER SURGERY      FAMILY HISTORY Family History  Problem Relation Age of Onset  . Diabetes Brother   . Heart disease Brother   . Diabetes Maternal Grandfather     SOCIAL HISTORY Social History   Tobacco Use  . Smoking status: Former Smoker    Quit date: 11/14/2005    Years since quitting: 13.9  . Smokeless tobacco: Never Used  Vaping Use  . Vaping Use: Never used  Substance Use Topics  . Alcohol use: Yes    Comment: occ  . Drug use: Never         OPHTHALMIC EXAM:  Base Eye Exam    Visual Acuity (Snellen - Linear)      Right Left   Dist cc 20/20 -2 20/50 -2   Dist ph cc  NI   Correction: Glasses       Tonometry (Tonopen, 7:59 AM)      Right Left   Pressure 14 15       Pupils      Dark Light Shape React APD   Right 3 2 Round Minimal 0   Left 3 2 Round Minimal 0       Visual Fields      Left Right    Full Full  Extraocular Movement      Right Left    Full Full       Neuro/Psych    Oriented x3: Yes   Mood/Affect: Normal       Dilation    Both eyes: 1.0% Mydriacyl, 2.5% Phenylephrine @ 7:59 AM        Slit Lamp and Fundus Exam    Slit Lamp Exam      Right Left   Lids/Lashes Dermatochalasis - upper lid Dermatochalasis - upper lid   Conjunctiva/Sclera White and quiet White and quiet   Cornea mild Arcus, mild Debris in tear film mild Arcus, mild Debris in tear film, 1+ inferior Punctate epithelial erosions   Anterior Chamber Deep and quiet Deep and quiet   Iris Round and dilated Round and dilated   Lens 2+ Nuclear sclerosis, 2+ Cortical cataract 2+ Nuclear sclerosis, 2+ Cortical cataract   Vitreous Vitreous syneresis Vitreous syneresis       Fundus Exam      Right Left   Disc Pink and Sharp, focal PPP ST, Compact Pink and Sharp, Compact   C/D Ratio 0.2 0.3   Macula Flat, Drusen, RPE mottling and clumping, early Atrophy, punctate IRH Abnormal foveal reflex, +CNV with +IRH  (slightly improved) SRH and +central edema -- improved, Drusen, RPE mottling and clumping   Vessels Vascular attenuation, AV crossing changes, perivascular pigment clumping peripherally Vascular attenuation, Tortuousity, perivascular pigment clumping peripherally   Periphery Attached, peripheral pigmented CR scarring and perivascular bone spicules 360 Attached, peripheral pigmented CR scarring and perivascular bone spicules 360        Refraction    Wearing Rx      Sphere Cylinder Axis   Right [place] +1.00 171   Left +0.25 +0.50 174          IMAGING AND PROCEDURES  Imaging and Procedures for _0 @  OCT, Retina - OU - Both Eyes       Right Eye Quality was good. Central Foveal Thickness: 308. Progression has been stable. Findings include normal foveal contour, no IRF, no SRF, retinal drusen , outer retinal atrophy, pigment epithelial detachment (Low lying PEDs temporal macula caught on widefield; partial PVD, vitreous opacities).   Left Eye Quality was good. Central Foveal Thickness: 474. Progression has improved. Findings include abnormal foveal contour, intraretinal fluid, subretinal hyper-reflective material, pigment epithelial detachment, outer retinal atrophy, retinal drusen  (Interval improvement in IRF overlying PED/SRHM).   Notes *Images captured and stored on drive  Diagnosis / Impression:  OD: non-exu ARMD  OS: exu ARMD -- Interval improvement in IRF overlying PED/SRHM  Clinical management:  See below  Abbreviations: NFP - Normal foveal profile. CME - cystoid macular edema. PED - pigment epithelial detachment. IRF - intraretinal fluid. SRF - subretinal fluid. EZ - ellipsoid zone. ERM - epiretinal membrane. ORA - outer retinal atrophy. ORT - outer retinal tubulation. SRHM - subretinal hyper-reflective material        Intravitreal Injection, Pharmacologic Agent - OS - Left Eye       Time Out 10/22/2019. 8:32 AM. Confirmed correct patient, procedure, site, and  patient consented.   Anesthesia Topical anesthesia was used. Anesthetic medications included Lidocaine 2%, Proparacaine 0.5%.   Procedure Preparation included 5% betadine to ocular surface, eyelid speculum. A (32g) needle was used.   Injection:  2 mg aflibercept Alfonse Flavors) SOLN   NDC: A3590391, Lot: [id number], Expiration date: 01/14/2020   Route: Intravitreal, Site: Left Eye, Waste: 0.05 mL  Post-op Post injection exam found visual acuity  of at least counting fingers. The patient tolerated the procedure well. There were no complications. The patient received written and verbal post procedure care education.                 ASSESSMENT/PLAN:    ICD-10-CM   1. Exudative age-related macular degeneration of left eye with active choroidal neovascularization (HCC)  H35.3221 Intravitreal Injection, Pharmacologic Agent - OS - Left Eye    aflibercept (EYLEA) SOLN 2 mg  2. Retinal edema  H35.81 OCT, Retina - OU - Both Eyes  3. 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 Essential hypertension  I10   6. Hypertensive retinopathy of both eyes  H35.033   7. Combined forms of age-related cataract of both eyes  H25.813     1,2. 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, OS  - s/p IVA OS #1 (03.04.21), #2 (03.04.21), #3 (04.30.21) -- IVA resistance  - s/p IVE OS #1 (06.04.21)  - pt reports subjective improvement in vision OS  - exam shows central CNV with +subretinal heme -- improved  - BCVA 20/50-2 OS  - OCT shows CNV/PED w/ interval decrease in IRF/IRHM  - recommend IVE OS #2 today, 07.08.21   - pt wishes to be treated with IVE  - RBA of procedure discussed, questions answered  - informed consent obtained   - IVA consent form signed and scanned, 03.04.21  - IVE consent form signed and scanned, 06.04.21  - see procedure note  - Eylea[employer]U benefits investigation started, 04.30.21 -- approved as of  06.04.21  - f/u in 4-5 wks -- DFE/OCT, possible injection  3. Age related macular degeneration, non-exudative, OD  - The incidence, anatomy, and pathology of dry AMD, risk of progression, and the AREDS and AREDS 2 study including smoking risks discussed with patient.  - Recommend amsler grid monitoring    4. Pigmentary retinopathy OU  - peripheral pigmented CR scarring and perivascular pigment clumping / bone spicules OU  - VA remains fairly good (20/20 OD, 20/40 OS with exudative ARMD OS)  - denies night blindness  - denies family history of vision problems  - discussed findings  - broad differential for pigmentary retinopathy:  Congenital (RP), infectious (TB, lyme dx, syphilis, toxo, bartonella), autoimmune and toxic etiologies  - labs checked (4.2.21):  All essential WNL   Rheumatoid factor   CBC   Comprehensive metabolic panel     RPR   ANA   ESR, CRP   ACE   ANCA   Toxoplasma antibodies IgG / IgM   Quantiferon - TB Gold Plus   HLA, A, B, C (IR)   Fluorescent treponemal ab (fla) - IgG - bld   VDRL Serum   Bartonella antibody, 06.04.21  - [facility] -- drawn on 6.4.21 -- results still pending  - f/u 4-5 weeks  5,6. Hypertensive retinopathy OU  - discussed importance of tight BP control  - monitor  7. Mixed form age related cataracts OU  - The symptoms of cataract, surgical options, and treatments and risks were discussed with patient.  - discussed diagnosis and progression  - not yet visually significant  - monitor for now    Ophthalmic Meds Ordered this visit:  Meds ordered this encounter  Medications  . aflibercept (EYLEA) SOLN 2 mg       Return for f/u 4-5 weeks, exu ARMD OS, DFE, OCT.  There are no Patient Instructions on  file for this visit.   Explained the diagnoses, plan, and follow up with the patient and they expressed understanding.  Patient expressed understanding of the importance of proper follow up care.   This document serves as  a record of services personally performed by Gardiner Sleeper, MD, PhD. It was created on their behalf by Leonie Douglas, an ophthalmic technician. The creation of this record is the provider's dictation and/or activities during the visit.    Electronically signed by: Leonie Douglas COA, 10/22/19  10:09 PM   This document serves as a record of services personally performed by Gardiner Sleeper, MD, PhD. It was created on their behalf by San Jetty. Owens Shark, COT, an ophthalmic technician. The creation of this record is the provider's dictation and/or activities during the visit.    Electronically signed by: San Jetty. Owens Shark, [place] 07.08.2021 10:09 PM   Gardiner Sleeper, M.D., Ph.D. Diseases & Surgery of the Retina and Vitreous [facility]  I have reviewed the above documentation for accuracy and completeness, and I agree with the above. Gardiner Sleeper, M.D., Ph.D. 10/22/19 10:12 PM   Abbreviations: M myopia (nearsighted); A astigmatism; H hyperopia (farsighted); P presbyopia; Mrx spectacle prescription;  CTL contact lenses; OD right eye; OS left eye; OU both eyes  XT exotropia; ET esotropia; PEK punctate epithelial keratitis; PEE punctate epithelial erosions; DES dry eye syndrome; MGD meibomian gland dysfunction; ATs artificial tears; PFAT's preservative free artificial tears; Bracey nuclear sclerotic cataract; PSC posterior subcapsular cataract; ERM epi-retinal membrane; PVD posterior vitreous detachment; RD retinal detachment; DM diabetes mellitus; DR diabetic retinopathy; NPDR non-proliferative diabetic retinopathy; PDR proliferative diabetic retinopathy; CSME clinically significant macular edema; DME diabetic macular edema; dbh dot blot hemorrhages; CWS cotton wool spot; POAG primary open angle glaucoma; C/D cup-to-disc ratio; HVF humphrey visual field; GVF goldmann visual field; OCT optical coherence tomography; IOP intraocular pressure; BRVO Branch retinal vein occlusion; CRVO central  retinal vein occlusion; CRAO central retinal artery occlusion; BRAO branch retinal artery occlusion; RT retinal tear; SB scleral buckle; PPV pars plana vitrectomy; VH Vitreous hemorrhage; PRP panretinal laser photocoagulation; IVK intravitreal kenalog; VMT vitreomacular traction; MH Macular hole;  NVD neovascularization of the disc; NVE neovascularization elsewhere; AREDS age related eye disease study; ARMD age related macular degeneration; POAG primary open angle glaucoma; EBMD epithelial/anterior basement membrane dystrophy; ACIOL anterior chamber intraocular lens; IOL intraocular lens; PCIOL posterior chamber intraocular lens; Phaco/IOL phacoemulsification with intraocular lens placement; [place] photorefractive keratectomy; LASIK laser assisted in situ keratomileusis; HTN hypertension; DM diabetes mellitus; COPD chronic obstructive pulmonary disease

## 2019-10-22 ENCOUNTER — Encounter (INDEPENDENT_AMBULATORY_CARE_PROVIDER_SITE_OTHER): Payer: Self-pay | Admitting: Ophthalmology

## 2019-10-22 ENCOUNTER — Ambulatory Visit (INDEPENDENT_AMBULATORY_CARE_PROVIDER_SITE_OTHER): Payer: BC Managed Care – PPO | Admitting: Ophthalmology

## 2019-10-22 ENCOUNTER — Other Ambulatory Visit: Payer: Self-pay

## 2019-10-22 DIAGNOSIS — H353112 Nonexudative age-related macular degeneration, right eye, intermediate dry stage: Secondary | ICD-10-CM | POA: Diagnosis not present

## 2019-10-22 DIAGNOSIS — H3581 Retinal edema: Secondary | ICD-10-CM | POA: Diagnosis not present

## 2019-10-22 DIAGNOSIS — H353221 Exudative age-related macular degeneration, left eye, with active choroidal neovascularization: Secondary | ICD-10-CM | POA: Diagnosis not present

## 2019-10-22 DIAGNOSIS — H3552 Pigmentary retinal dystrophy: Secondary | ICD-10-CM

## 2019-10-22 DIAGNOSIS — H35033 Hypertensive retinopathy, bilateral: Secondary | ICD-10-CM

## 2019-10-22 DIAGNOSIS — I10 Essential (primary) hypertension: Secondary | ICD-10-CM

## 2019-10-22 DIAGNOSIS — H25813 Combined forms of age-related cataract, bilateral: Secondary | ICD-10-CM

## 2019-10-22 MED ORDER — AFLIBERCEPT 2MG/0.05ML IZ SOLN FOR KALEIDOSCOPE
2.0000 mg | INTRAVITREAL | Status: AC | PRN
  Administered 2019-10-22: 2 mg via INTRAVITREAL

## 2019-11-17 NOTE — Progress Notes (Signed)
[facility] Note  11/23/2019     CHIEF COMPLAINT Patient presents for Retina Follow Up   HISTORY OF PRESENT ILLNESS: Eric Key is a 64 y.o. male who presents to the clinic today for:   HPI    Retina Follow Up    Patient presents with  Wet AMD.  In left eye.  This started 4.5 weeks ago.  Severity is moderate.  I, the attending physician,  performed the HPI with the patient and updated documentation appropriately.          Comments    Patient here for 4.5 weeks retina follow up for exu ARMD OS Poss IVE OS. Patient states vision doing fine. No eye pain.        Last edited by [name], MD on 11/23/2019  8:18 AM. (History)    pt feels like his vision has improved since last injection   Referring physician: [name], [name], MD [place],  [place] [address]  HISTORICAL INFORMATION:   Selected notes from the MEDICAL RECORD NUMBER Referred by Dr. [name] for concern of mac heme OS.   CURRENT MEDICATIONS: No current outpatient medications on file. (Ophthalmic Drugs)   No current facility-administered medications for this visit. (Ophthalmic Drugs)   No current outpatient medications on file. (Other)   No current facility-administered medications for this visit. (Other)      REVIEW OF SYSTEMS: ROS    Positive for: Eyes   Negative for: Constitutional, Gastrointestinal, Neurological, Skin, Genitourinary, Musculoskeletal, HENT, Endocrine, Cardiovascular, Respiratory, Psychiatric, Allergic/Imm, Heme/Lymph   Last edited by Theodore Demark, COA on 11/23/2019  8:11 AM. (History)       ALLERGIES Allergies  Allergen Reactions  . Penicillins Hives    PAST MEDICAL HISTORY Past Medical History:  Diagnosis Date  . Cataract    Mixed form OU  . Hypertension   . Hypertensive retinopathy    OU  . Macular degeneration    Dry OD; Wet OS   Past Surgical History:  Procedure Laterality Date  . ACHILLES TENDON REPAIR  2010   . GALLBLADDER SURGERY      FAMILY HISTORY Family History  Problem Relation Age of Onset  . Diabetes Brother   . Heart disease Brother   . Diabetes Maternal Grandfather     SOCIAL HISTORY Social History   Tobacco Use  . Smoking status: Former Smoker    Quit date: 11/14/2005    Years since quitting: 14.0  . Smokeless tobacco: Never Used  Vaping Use  . Vaping Use: Never used  Substance Use Topics  . Alcohol use: Yes    Comment: occ  . Drug use: Never         OPHTHALMIC EXAM:  Base Eye Exam    Visual Acuity (Snellen - Linear)      Right Left   Dist cc 20/20 -2 20/50 -1   Dist ph cc  NI   Correction: Glasses       Tonometry (Tonopen, 8:08 AM)      Right Left   Pressure 12 12       Pupils      Dark Light Shape React APD   Right 3 2 Round Brisk None   Left 3 2 Round Brisk None       Visual Fields (Counting fingers)      Left Right    Full Full       Extraocular Movement  Right Left    Full, Ortho Full, Ortho       Neuro/Psych    Oriented x3: Yes   Mood/Affect: Normal       Dilation    Both eyes: 1.0% Mydriacyl, 2.5% Phenylephrine @ 8:08 AM        Slit Lamp and Fundus Exam    Slit Lamp Exam      Right Left   Lids/Lashes Dermatochalasis - upper lid Dermatochalasis - upper lid   Conjunctiva/Sclera White and quiet White and quiet   Cornea 2-3+ PEE, mild Arcus, mild Debris in tear film mild Arcus, mild Debris in tear film, 1-2+ inferior Punctate epithelial erosions   Anterior Chamber Deep and quiet Deep and quiet   Iris Round and dilated Round and dilated   Lens 2+ Nuclear sclerosis, 2+ Cortical cataract 2+ Nuclear sclerosis, 2+ Cortical cataract   Vitreous Vitreous syneresis Vitreous syneresis       Fundus Exam      Right Left   Disc Pink and Sharp, focal PPP ST, Compact Pink and Sharp, Compact   C/D Ratio 0.3 0.3   Macula Flat, Drusen, RPE mottling and clumping, early Atrophy, punctate IRH Abnormal foveal reflex, +CNV with +IRH  (slightly improved) SRH and +central edema -- improved, Drusen, RPE mottling and clumping   Vessels Vascular attenuation, AV crossing changes, perivascular pigment clumping peripherally Vascular attenuation, Tortuousity, perivascular pigment clumping peripherally   Periphery Attached, peripheral pigmented CR scarring and perivascular bone spicules 360 Attached, peripheral pigmented CR scarring and perivascular bone spicules 360        Refraction    Wearing Rx      Sphere Cylinder Axis   Right [place] +1.00 171   Left +0.25 +0.50 174          IMAGING AND PROCEDURES  Imaging and Procedures for _0 @  OCT, Retina - OU - Both Eyes       Right Eye Quality was borderline. Central Foveal Thickness: 313. Progression has been stable. Findings include normal foveal contour, no IRF, no SRF, retinal drusen , outer retinal atrophy, pigment epithelial detachment (Low lying PEDs temporal macula caught on widefield).   Left Eye Quality was good. Central Foveal Thickness: 449. Progression has improved. Findings include abnormal foveal contour, intraretinal fluid, subretinal hyper-reflective material, pigment epithelial detachment, outer retinal atrophy, retinal drusen  (Interval improvement in IRF overlying PED/SRHM).   Notes *Images captured and stored on drive  Diagnosis / Impression:  OD: non-exu ARMD  OS: exu ARMD -- Mild interval improvement in IRF overlying PED/SRHM  Clinical management:  See below  Abbreviations: NFP - Normal foveal profile. CME - cystoid macular edema. PED - pigment epithelial detachment. IRF - intraretinal fluid. SRF - subretinal fluid. EZ - ellipsoid zone. ERM - epiretinal membrane. ORA - outer retinal atrophy. ORT - outer retinal tubulation. SRHM - subretinal hyper-reflective material        Intravitreal Injection, Pharmacologic Agent - OS - Left Eye       Time Out 11/23/2019. 12:00 AM. Confirmed correct patient, procedure, site, and patient consented.    Anesthesia Topical anesthesia was used. Anesthetic medications included Lidocaine 2%, Proparacaine 0.5%.   Procedure Preparation included 5% betadine to ocular surface, eyelid speculum. A supplied (32g) needle was used.   Injection:  2 mg aflibercept Alfonse Flavors) SOLN   NDC: A3590391, Lot: [id number], Expiration date: 02/14/2020   Route: Intravitreal, Site: Left Eye, Waste: 0.05 mL  Post-op Post injection exam found visual acuity of at least counting fingers.  The patient tolerated the procedure well. There were no complications. The patient received written and verbal post procedure care education. Post injection medications were not given.                 ASSESSMENT/PLAN:    ICD-10-CM   1. Exudative age-related macular degeneration of left eye with active choroidal neovascularization (HCC)  H35.3221 Intravitreal Injection, Pharmacologic Agent - OS - Left Eye    aflibercept (EYLEA) SOLN 2 mg  2. Retinal edema  H35.81 OCT, Retina - OU - Both Eyes  3. 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 Essential hypertension  I10   6. Hypertensive retinopathy of both eyes  H35.033   7. Combined forms of age-related cataract of both eyes  H25.813     1,2. 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, OS  - s/p IVA OS #1 (03.04.21), #2 (03.04.21), #3 (04.30.21) -- IVA resistance  - s/p IVE OS #1 (06.04.21), #2 (07.08.21)  - pt reports subjective improvement in vision OS  - exam shows central CNV with +subretinal heme -- improving slowly  - BCVA 20/50-1 OS  - OCT shows interval decrease in CNV/PED and overlying IRF/IRHM  - recommend IVE OS #3 today, 08.09.21   - pt wishes to be treated with IVE  - RBA of procedure discussed, questions answered  - informed consent obtained   - IVA consent form signed and scanned, 03.04.21  - IVE consent form signed and scanned, 06.04.21  - see procedure note  -  Eylea[employer] benefits investigation started, 04.30.21 -- approved as of 06.04.21  - f/u in 4-5 wks -- DFE/OCT, possible injection  3. Age related macular degeneration, non-exudative, OD  - The incidence, anatomy, and pathology of dry AMD, risk of progression, and the AREDS and AREDS 2 study including smoking risks discussed with patient.  - Recommend amsler grid monitoring    4. Pigmentary retinopathy OU  - peripheral pigmented CR scarring and perivascular pigment clumping / bone spicules OU  - VA remains fairly good (20/20 OD, 20/40 OS with exudative ARMD OS)  - denies night blindness  - denies family history of vision problems  - discussed findings  - broad differential for pigmentary retinopathy:  Congenital (RP), infectious (TB, lyme dx, syphilis, toxo, bartonella), autoimmune and toxic etiologies  - labs checked (4.2.21):  All essential WNL   Rheumatoid factor   CBC   Comprehensive metabolic panel     RPR   ANA   ESR, CRP   ACE   ANCA   Toxoplasma antibodies IgG / IgM   Quantiferon - TB Gold Plus   HLA, A, B, C (IR)   Fluorescent treponemal ab (fla) - IgG - bld   VDRL Serum   Bartonella antibody, 06.04.21  - [facility] -- drawn on 6.4.21                         Results Positive for AGBL5, ALMS1, OPA1 -- unknown significance             - f/u 4-5 weeks  5,6. Hypertensive retinopathy OU  - discussed importance of tight BP control  - monitor  7. Mixed form age related cataracts OU  - The symptoms of cataract, surgical options, and treatments and risks were discussed with patient.  - discussed diagnosis and progression  - not yet visually significant  - monitor for now  Ophthalmic Meds Ordered this visit:  Meds ordered this encounter  Medications  . aflibercept (EYLEA) SOLN 2 mg       Return in about 4 weeks (around 12/21/2019) for ex ARMD, Dilated Exam, OCT, Possible Injxn.  There are no Patient Instructions on file for this visit.   Explained the  diagnoses, plan, and follow up with the patient and they expressed understanding.  Patient expressed understanding of the importance of proper follow up care.   This document serves as a record of services personally performed by Gardiner Sleeper, MD, PhD. It was created on their behalf by Leonie Douglas, an ophthalmic technician. The creation of this record is the provider's dictation and/or activities during the visit.    Electronically signed by: Leonie Douglas COA, 11/23/19  8:54 AM   Gardiner Sleeper, M.D., Ph.D. Diseases & Surgery of the Retina and Vitreous [facility]  I have reviewed the above documentation for accuracy and completeness, and I agree with the above. Gardiner Sleeper, M.D., Ph.D. 11/23/19 8:54 AM   Abbreviations: M myopia (nearsighted); A astigmatism; H hyperopia (farsighted); P presbyopia; Mrx spectacle prescription;  CTL contact lenses; OD right eye; OS left eye; OU both eyes  XT exotropia; ET esotropia; PEK punctate epithelial keratitis; PEE punctate epithelial erosions; DES dry eye syndrome; MGD meibomian gland dysfunction; ATs artificial tears; PFAT's preservative free artificial tears; [place] nuclear sclerotic cataract; PSC posterior subcapsular cataract; ERM epi-retinal membrane; PVD posterior vitreous detachment; RD retinal detachment; DM diabetes mellitus; DR diabetic retinopathy; NPDR non-proliferative diabetic retinopathy; PDR proliferative diabetic retinopathy; CSME clinically significant macular edema; DME diabetic macular edema; dbh dot blot hemorrhages; CWS cotton wool spot; POAG primary open angle glaucoma; C/D cup-to-disc ratio; HVF humphrey visual field; GVF goldmann visual field; OCT optical coherence tomography; IOP intraocular pressure; BRVO Branch retinal vein occlusion; CRVO central retinal vein occlusion; CRAO central retinal artery occlusion; BRAO branch retinal artery occlusion; RT retinal tear; SB scleral buckle; PPV pars plana vitrectomy;  VH Vitreous hemorrhage; PRP panretinal laser photocoagulation; IVK intravitreal kenalog; VMT vitreomacular traction; MH Macular hole;  NVD neovascularization of the disc; NVE neovascularization elsewhere; AREDS age related eye disease study; ARMD age related macular degeneration; POAG primary open angle glaucoma; EBMD epithelial/anterior basement membrane dystrophy; ACIOL anterior chamber intraocular lens; IOL intraocular lens; PCIOL posterior chamber intraocular lens; Phaco/IOL phacoemulsification with intraocular lens placement; [place] photorefractive keratectomy; LASIK laser assisted in situ keratomileusis; HTN hypertension; DM diabetes mellitus; COPD chronic obstructive pulmonary disease

## 2019-11-23 ENCOUNTER — Other Ambulatory Visit: Payer: Self-pay

## 2019-11-23 ENCOUNTER — Encounter (INDEPENDENT_AMBULATORY_CARE_PROVIDER_SITE_OTHER): Payer: Self-pay | Admitting: Ophthalmology

## 2019-11-23 ENCOUNTER — Ambulatory Visit (INDEPENDENT_AMBULATORY_CARE_PROVIDER_SITE_OTHER): Payer: BC Managed Care – PPO | Admitting: Ophthalmology

## 2019-11-23 DIAGNOSIS — H353112 Nonexudative age-related macular degeneration, right eye, intermediate dry stage: Secondary | ICD-10-CM | POA: Diagnosis not present

## 2019-11-23 DIAGNOSIS — H353221 Exudative age-related macular degeneration, left eye, with active choroidal neovascularization: Secondary | ICD-10-CM

## 2019-11-23 DIAGNOSIS — H3581 Retinal edema: Secondary | ICD-10-CM

## 2019-11-23 DIAGNOSIS — H25813 Combined forms of age-related cataract, bilateral: Secondary | ICD-10-CM

## 2019-11-23 DIAGNOSIS — I10 Essential (primary) hypertension: Secondary | ICD-10-CM

## 2019-11-23 DIAGNOSIS — H35033 Hypertensive retinopathy, bilateral: Secondary | ICD-10-CM

## 2019-11-23 DIAGNOSIS — H3552 Pigmentary retinal dystrophy: Secondary | ICD-10-CM | POA: Diagnosis not present

## 2019-11-23 MED ORDER — AFLIBERCEPT 2MG/0.05ML IZ SOLN FOR KALEIDOSCOPE
2.0000 mg | INTRAVITREAL | Status: AC | PRN
  Administered 2019-11-23: 2 mg via INTRAVITREAL

## 2019-12-01 ENCOUNTER — Encounter (INDEPENDENT_AMBULATORY_CARE_PROVIDER_SITE_OTHER): Payer: BC Managed Care – PPO | Admitting: Ophthalmology

## 2019-12-04 ENCOUNTER — Encounter: Payer: Self-pay | Admitting: Family Medicine

## 2019-12-04 ENCOUNTER — Ambulatory Visit (INDEPENDENT_AMBULATORY_CARE_PROVIDER_SITE_OTHER): Payer: BC Managed Care – PPO | Admitting: Family Medicine

## 2019-12-04 ENCOUNTER — Other Ambulatory Visit: Payer: Self-pay

## 2019-12-04 VITALS — BP 127/87 | HR 106 | Temp 97.8°F | Ht 73.0 in | Wt 251.0 lb

## 2019-12-04 DIAGNOSIS — Z716 Tobacco abuse counseling: Secondary | ICD-10-CM

## 2019-12-04 DIAGNOSIS — Z114 Encounter for screening for human immunodeficiency virus [HIV]: Secondary | ICD-10-CM

## 2019-12-04 DIAGNOSIS — Z1159 Encounter for screening for other viral diseases: Secondary | ICD-10-CM

## 2019-12-04 DIAGNOSIS — Z0000 Encounter for general adult medical examination without abnormal findings: Secondary | ICD-10-CM | POA: Diagnosis not present

## 2019-12-04 DIAGNOSIS — Z136 Encounter for screening for cardiovascular disorders: Secondary | ICD-10-CM | POA: Diagnosis not present

## 2019-12-04 DIAGNOSIS — Z1211 Encounter for screening for malignant neoplasm of colon: Secondary | ICD-10-CM | POA: Diagnosis not present

## 2019-12-04 NOTE — Progress Notes (Signed)
 BP 127/87   Pulse (!) 106   Temp 97.8 F (36.6 C)   Ht 6' 1" (1.854 m)   Wt 251 lb (113.9 kg)   SpO2 91%   BMI 33.12 kg/m    Subjective:   [name], male    DOB: 05/09/1955, 64 y.o.   MRN: [id number]  HPI: Eric Key is a 64 y.o. male presenting on 12/04/2019 for Medical Management of Chronic Issues (Hx with [facility]. Pt can call and schedule. Has no GI concerns)   HPI Adult well exam and physical Patient is coming in today for adult well exam and physical and checkup of chronic issues. Patient denies any chest pain, shortness of breath, headaches or vision issues, abdominal complaints, diarrhea, nausea, vomiting. He does have an occasional elbow pain when he sleeps in a funny position and the occasional back pain when he sits in a funny position but other than that no major issues.  Patient does need a referral to gastroenterology for his colonoscopy last one was in 2007  Relevant past medical, surgical, family and social history reviewed and updated as indicated. Interim medical history since our last visit reviewed. Allergies and medications reviewed and updated.  Review of Systems  Constitutional: Negative for chills and fever.  HENT: Negative for ear pain and tinnitus.   Eyes: Negative for pain and visual disturbance.  Respiratory: Negative for cough, shortness of breath and wheezing.   Cardiovascular: Negative for chest pain, palpitations and leg swelling.  Gastrointestinal: Negative for abdominal pain, blood in stool, constipation and diarrhea.  Genitourinary: Negative for dysuria and hematuria.  Musculoskeletal: Negative for back pain, gait problem and myalgias.  Skin: Negative for rash.  Neurological: Negative for dizziness, weakness, light-headedness, numbness and headaches.  Psychiatric/Behavioral: Negative for suicidal ideas.  All other systems reviewed and are negative.   Per HPI unless specifically indicated above   Allergies as of  12/04/2019      Reactions   Penicillins Hives      Medication List    as of December 04, 2019  8:42 AM   You have not been prescribed any medications.      Objective:   BP 127/87   Pulse (!) 106   Temp 97.8 F (36.6 C)   Ht 6' 1" (1.854 m)   Wt 251 lb (113.9 kg)   SpO2 91%   BMI 33.12 kg/m   Wt Readings from Last 3 Encounters:  12/04/19 251 lb (113.9 kg)  11/29/17 259 lb 9.6 oz (117.8 kg)  11/14/17 254 lb 12.8 oz (115.6 kg)    Physical Exam Vitals and nursing note reviewed.  Constitutional:      General: He is not in acute distress.    Appearance: He is well-developed. He is not diaphoretic.  HENT:     Right Ear: External ear normal.     Left Ear: External ear normal.     Nose: Nose normal.     Mouth/Throat:     Pharynx: No oropharyngeal exudate.  Eyes:     General: No scleral icterus.       Right eye: No discharge.     Conjunctiva/sclera: Conjunctivae normal.     Pupils: Pupils are equal, round, and reactive to light.  Neck:     Thyroid: No thyromegaly.  Cardiovascular:     Rate and Rhythm: Normal rate and regular rhythm.     Heart sounds: Normal heart sounds. No murmur heard.   Pulmonary:       Effort: Pulmonary effort is normal. No respiratory distress.     Breath sounds: Normal breath sounds. No wheezing.  Abdominal:     General: Bowel sounds are normal. There is no distension.     Palpations: Abdomen is soft.     Tenderness: There is no abdominal tenderness. There is no guarding or rebound.  Musculoskeletal:        General: Normal range of motion.     Cervical back: Neck supple.  Lymphadenopathy:     Cervical: No cervical adenopathy.  Skin:    General: Skin is warm and dry.     Findings: No rash.  Neurological:     Mental Status: He is alert and oriented to person, place, and time.     Coordination: Coordination normal.  Psychiatric:        Behavior: Behavior normal.       Assessment & Plan:   Problem List Items Addressed This Visit     None    Visit Diagnoses    Well adult exam    -  Primary   Relevant Orders   CBC with Differential/Platelet   CMP14+EGFR   Lipid panel   Tdap vaccine greater than or equal to 7yo IM   Need for hepatitis C screening test       Relevant Orders   Hepatitis C antibody   Encounter for screening for HIV       Relevant Orders   HIV Antibody (routine testing w rflx)   Colon cancer screening       Relevant Orders   Ambulatory referral to Gastroenterology      Discussed smoking cessation,'s patient smokes cigars but not more than 1 or 2 a day, discussed smoking cessation and he said he will work on it, he says a lot of that is tied into work.  And stress from work  Approximately for 4 minutes of tobacco cessation counseling given Follow up plan: Return in about 1 year (around 12/03/2020), or if symptoms worsen or fail to improve.  Counseling provided for all of the vaccine components Orders Placed This Encounter  Procedures  . HIV Antibody (routine testing w rflx)  . Hepatitis C antibody  . CBC with Differential/Platelet  . CMP14+EGFR  . Lipid panel  . Ambulatory referral to Gastroenterology    Caryl Pina, MD [facility] 12/04/2019, 8:42 AM

## 2019-12-05 LAB — CBC WITH DIFFERENTIAL/PLATELET
Basophils Absolute: 0.1 10*3/uL (ref 0.0–0.2)
Basos: 1 %
EOS (ABSOLUTE): 0.1 10*3/uL (ref 0.0–0.4)
Eos: 2 %
Hematocrit: 43.8 % (ref 37.5–51.0)
Hemoglobin: 15 g/dL (ref 13.0–17.7)
Immature Grans (Abs): 0 10*3/uL (ref 0.0–0.1)
Immature Granulocytes: 0 %
Lymphocytes Absolute: 1.4 10*3/uL (ref 0.7–3.1)
Lymphs: 21 %
MCH: 31.6 pg (ref 26.6–33.0)
MCHC: 34.2 g/dL (ref 31.5–35.7)
MCV: 92 fL (ref 79–97)
Monocytes Absolute: 0.7 10*3/uL (ref 0.1–0.9)
Monocytes: 10 %
Neutrophils Absolute: 4.3 10*3/uL (ref 1.4–7.0)
Neutrophils: 66 %
RBC: 4.74 x10E6/uL (ref 4.14–5.80)
RDW: 11.8 % (ref 11.6–15.4)
WBC: 6.6 10*3/uL (ref 3.4–10.8)

## 2019-12-05 LAB — CMP14+EGFR
ALT: 11 IU/L (ref 0–44)
AST: 13 IU/L (ref 0–40)
Albumin/Globulin Ratio: 1.2 (ref 1.2–2.2)
Albumin: 4.2 g/dL (ref 3.8–4.8)
Alkaline Phosphatase: 57 IU/L (ref 48–121)
BUN/Creatinine Ratio: 12 (ref 10–24)
BUN: 12 mg/dL (ref 8–27)
Bilirubin Total: 0.3 mg/dL (ref 0.0–1.2)
CO2: 19 mmol/L — ABNORMAL LOW (ref 20–29)
Calcium: 9.1 mg/dL (ref 8.6–10.2)
Chloride: 102 mmol/L (ref 96–106)
Creatinine, Ser: 1.03 mg/dL (ref 0.76–1.27)
GFR calc Af Amer: 90 mL/min/{1.73_m2} (ref >59)
GFR calc non Af Amer: 77 mL/min/{1.73_m2} (ref >59)
Globulin, Total: 3.5 g/dL (ref 1.5–4.5)
Glucose: 158 mg/dL — ABNORMAL HIGH (ref 65–99)
Potassium: 4.4 mmol/L (ref 3.5–5.2)
Sodium: 135 mmol/L (ref 134–144)
Total Protein: 7.7 g/dL (ref 6.0–8.5)

## 2019-12-05 LAB — LIPID PANEL
Chol/HDL Ratio: 5 ratio (ref 0.0–5.0)
Cholesterol, Total: 166 mg/dL (ref 100–199)
HDL: 33 mg/dL — ABNORMAL LOW (ref >39)
LDL Chol Calc (NIH): 116 mg/dL — ABNORMAL HIGH (ref 0–99)
Triglycerides: 91 mg/dL (ref 0–149)
VLDL Cholesterol Cal: 17 mg/dL (ref 5–40)

## 2019-12-05 LAB — HEPATITIS C ANTIBODY: Hep C Virus Ab: <0.1 s/co ratio (ref 0.0–0.9)

## 2019-12-05 LAB — HIV ANTIBODY (ROUTINE TESTING W REFLEX): HIV Screen 4th Generation wRfx: NONREACTIVE

## 2019-12-10 ENCOUNTER — Other Ambulatory Visit: Payer: Self-pay | Admitting: *Deleted

## 2019-12-10 DIAGNOSIS — R739 Hyperglycemia, unspecified: Secondary | ICD-10-CM

## 2019-12-10 DIAGNOSIS — E7800 Pure hypercholesterolemia, unspecified: Secondary | ICD-10-CM

## 2019-12-23 NOTE — Progress Notes (Signed)
[facility] Note  12/28/2019     CHIEF COMPLAINT Patient presents for Retina Follow Up   HISTORY OF PRESENT ILLNESS: Eric Key is a 64 y.o. male who presents to the clinic today for:   HPI    Retina Follow Up    Patient presents with  Wet AMD.  In left eye.  Severity is moderate.  Duration of 4 weeks.  Since onset it is stable.  I, the attending physician,  performed the HPI with the patient and updated documentation appropriately.          Comments    Patient states vision the same OU.       Last edited by [name], MD on 12/28/2019  8:16 AM. (History)    pt feels like his vision is continuing to improve, he feels like the treatments are working   Referring physician: [name], [name], MD [place],  [place] [id number]  HISTORICAL INFORMATION:   Selected notes from the [facility] Referred by Dr. [name] for concern of mac heme OS.   CURRENT MEDICATIONS: No current outpatient medications on file. (Ophthalmic Drugs)   No current facility-administered medications for this visit. (Ophthalmic Drugs)   No current outpatient medications on file. (Other)   No current facility-administered medications for this visit. (Other)      REVIEW OF SYSTEMS: ROS    Positive for: Eyes   Negative for: Constitutional, Gastrointestinal, Neurological, Skin, Genitourinary, Musculoskeletal, HENT, Endocrine, Cardiovascular, Respiratory, Psychiatric, Allergic/Imm, Heme/Lymph   Last edited by Roselee Nova D, COT on 12/28/2019  7:56 AM. (History)       ALLERGIES Allergies  Allergen Reactions  . Penicillins Hives    PAST MEDICAL HISTORY Past Medical History:  Diagnosis Date  . Cataract    Mixed form OU  . Hypertension   . Hypertensive retinopathy    OU  . Macular degeneration    Dry OD; Wet OS   Past Surgical History:  Procedure Laterality Date  . ACHILLES TENDON REPAIR  2010  . GALLBLADDER SURGERY       FAMILY HISTORY Family History  Problem Relation Age of Onset  . Diabetes Brother   . Heart disease Brother   . Diabetes Maternal Grandfather     SOCIAL HISTORY Social History   Tobacco Use  . Smoking status: Former Smoker    Quit date: 11/14/2005    Years since quitting: 14.1  . Smokeless tobacco: Never Used  Vaping Use  . Vaping Use: Never used  Substance Use Topics  . Alcohol use: Yes    Comment: occ  . Drug use: Never         OPHTHALMIC EXAM:  Base Eye Exam    Visual Acuity (Snellen - Linear)      Right Left   Dist cc 20/20 -1 20/40 -2   Dist ph cc  20/40   Correction: Glasses       Tonometry (Tonopen, 8:03 AM)      Right Left   Pressure 14 14       Pupils      Dark Light Shape React APD   Right 3 2 Round Brisk None   Left 3 2 Round Brisk None       Visual Fields (Counting fingers)      Left Right    Full Full       Extraocular Movement      Right Left  Full, Ortho Full, Ortho       Neuro/Psych    Oriented x3: Yes   Mood/Affect: Normal       Dilation    Both eyes: 1.0% Mydriacyl, 2.5% Phenylephrine @ 8:03 AM        Slit Lamp and Fundus Exam    Slit Lamp Exam      Right Left   Lids/Lashes Dermatochalasis - upper lid Dermatochalasis - upper lid   Conjunctiva/Sclera White and quiet White and quiet   Cornea 2-3+ PEE, mild Arcus, mild Debris in tear film mild Arcus, mild Debris in tear film, 1-2+ inferior Punctate epithelial erosions   Anterior Chamber Deep and quiet Deep and quiet   Iris Round and dilated Round and dilated   Lens 2+ Nuclear sclerosis, 2+ Cortical cataract 2+ Nuclear sclerosis, 2+ Cortical cataract   Vitreous Vitreous syneresis Vitreous syneresis       Fundus Exam      Right Left   Disc Pink and Sharp, focal PPP ST, Compact Pink and Sharp, Compact   C/D Ratio 0.3 0.3   Macula Flat, Drusen, RPE mottling and clumping, early Atrophy, punctate IRH Abnormal foveal reflex, +CNV with +IRH / SRH -- resolved and +central  edema -- improved, Drusen, RPE mottling and clumping   Vessels Vascular attenuation, AV crossing changes, perivascular pigment clumping peripherally, mild tortuousity Vascular attenuation, Tortuousity, perivascular pigment clumping peripherally   Periphery Attached, peripheral pigmented CR scarring and perivascular bone spicules 360 Attached, peripheral pigmented CR scarring and perivascular bone spicules 360        Refraction    Wearing Rx      Sphere Cylinder Axis   Right [place] +1.00 171   Left +0.25 +0.50 174          IMAGING AND PROCEDURES  Imaging and Procedures for _0 @  OCT, Retina - OU - Both Eyes       Right Eye Quality was borderline. Central Foveal Thickness: 305. Progression has been stable. Findings include normal foveal contour, no IRF, no SRF, retinal drusen , outer retinal atrophy, pigment epithelial detachment (Stable Low lying PEDs temporal macula caught on widefield).   Left Eye Quality was good. Central Foveal Thickness: 340. Progression has improved. Findings include abnormal foveal contour, intraretinal fluid, subretinal hyper-reflective material, pigment epithelial detachment, outer retinal atrophy, retinal drusen  (Interval improvement in IRF overlying PED/SRHM).   Notes *Images captured and stored on drive  Diagnosis / Impression:  OD: non-exu ARMD  OS: exu ARMD -- interval improvement in IRF overlying PED/SRHM  Clinical management:  See below  Abbreviations: NFP - Normal foveal profile. CME - cystoid macular edema. PED - pigment epithelial detachment. IRF - intraretinal fluid. SRF - subretinal fluid. EZ - ellipsoid zone. ERM - epiretinal membrane. ORA - outer retinal atrophy. ORT - outer retinal tubulation. SRHM - subretinal hyper-reflective material        Intravitreal Injection, Pharmacologic Agent - OS - Left Eye       Time Out 12/28/2019. 8:12 AM. Confirmed correct patient, procedure, site, and patient consented.   Anesthesia Topical  anesthesia was used. Anesthetic medications included Lidocaine 2%, Proparacaine 0.5%.   Procedure Preparation included 5% betadine to ocular surface, eyelid speculum. A (32g) needle was used.   Injection:  2 mg aflibercept Alfonse Flavors) SOLN   NDC: A3590391, Lot: [id number], Expiration date: 03/16/2020   Route: Intravitreal, Site: Left Eye, Waste: 0.05 mL  Post-op Post injection exam found visual acuity of at least counting fingers. The patient tolerated  the procedure well. There were no complications. The patient received written and verbal post procedure care education. Post injection medications were not given.                 ASSESSMENT/PLAN:    ICD-10-CM   1. Exudative age-related macular degeneration of left eye with active choroidal neovascularization (HCC)  H35.3221 Intravitreal Injection, Pharmacologic Agent - OS - Left Eye    aflibercept (EYLEA) SOLN 2 mg  2. Retinal edema  H35.81 OCT, Retina - OU - Both Eyes  3. 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 Essential hypertension  I10   6. Hypertensive retinopathy of both eyes  H35.033   7. Combined forms of age-related cataract of both eyes  H25.813     1,2. 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, OS  - s/p IVA OS #1 (03.04.21), #2 (03.04.21), #3 (04.30.21) -- IVA resistance  - s/p IVE OS #1 (06.04.21), #2 (07.08.21), #3 (08.09.21)  - pt reports subjective improvement in vision OS  - exam shows central CNV with +subretinal heme -- improving  - BCVA 20/40 OS from 20/50  - OCT shows interval decrease in CNV/PED and overlying IRF/IRHM  - recommend IVE OS #4 today, 09.13.21   - pt wishes to be treated with IVE  - RBA of procedure discussed, questions answered  - informed consent obtained   - IVA consent form signed and scanned, 03.04.21  - IVE consent form signed and scanned, 06.04.21  - see procedure note  - Eylea4U benefits  investigation started, 04.30.21 -- approved as of 06.04.21  - f/u in 4-5 wks -- DFE/OCT, possible injection  3. Age related macular degeneration, non-exudative, OD  - The incidence, anatomy, and pathology of dry AMD, risk of progression, and the AREDS and AREDS 2 study including smoking risks discussed with patient.  - Recommend amsler grid monitoring    4. Pigmentary retinopathy OU  - peripheral pigmented CR scarring and perivascular pigment clumping / bone spicules OU  - VA remains fairly good (20/20 OD, 20/40 OS with exudative ARMD OS)  - denies night blindness  - denies family history of vision problems  - discussed findings  - broad differential for pigmentary retinopathy:  Congenital (RP), infectious (TB, lyme dx, syphilis, toxo, bartonella), autoimmune and toxic etiologies  - labs checked (4.2.21):  All essential WNL   Rheumatoid factor   CBC   Comprehensive metabolic panel     RPR   ANA   ESR, CRP   ACE   ANCA   Toxoplasma antibodies IgG / IgM   Quantiferon - TB Gold Plus   HLA, A, B, C (IR)   Fluorescent treponemal ab (fla) - IgG - bld   VDRL Serum   Bartonella antibody, 06.04.21  - [facility] -- drawn on 6.4.21                         Results Positive for AGBL5, ALMS1, OPA1 -- unknown significance             - f/u 4-5 weeks  5,6. Hypertensive retinopathy OU  - discussed importance of tight BP control  - monitor  7. Mixed form age related cataracts OU  - The symptoms of cataract, surgical options, and treatments and risks were discussed with patient.  - discussed diagnosis and progression  - not yet visually significant  - monitor for now  Ophthalmic Meds Ordered this visit:  Meds ordered this encounter  Medications  . aflibercept (EYLEA) SOLN 2 mg       Return for f/u 4-5 weeks, exu ARMD OS, DFE, OCT.  There are no Patient Instructions on file for this visit.  This document serves as a record of services personally performed by Gardiner Sleeper, MD, PhD. It was created on their behalf by Leeann Must, Thor, an ophthalmic technician. The creation of this record is the provider's dictation and/or activities during the visit.    Electronically signed by: Leeann Must, [place] 09.08.2021 8:44 AM   This document serves as a record of services personally performed by Gardiner Sleeper, MD, PhD. It was created on their behalf by San Jetty. Owens Shark, OA an ophthalmic technician. The creation of this record is the provider's dictation and/or activities during the visit.    Electronically signed by: San Jetty. Marguerita Merles 09.13.2021 8:44 AM  Gardiner Sleeper, M.D., Ph.D. Diseases & Surgery of the Retina and [place] 12/28/2019   I have reviewed the above documentation for accuracy and completeness, and I agree with the above. Gardiner Sleeper, M.D., Ph.D. 12/28/19 8:44 AM   Abbreviations: M myopia (nearsighted); A astigmatism; H hyperopia (farsighted); P presbyopia; Mrx spectacle prescription;  CTL contact lenses; OD right eye; OS left eye; OU both eyes  XT exotropia; ET esotropia; PEK punctate epithelial keratitis; PEE punctate epithelial erosions; DES dry eye syndrome; MGD meibomian gland dysfunction; ATs artificial tears; PFAT's preservative free artificial tears; [place] nuclear sclerotic cataract; PSC posterior subcapsular cataract; ERM epi-retinal membrane; PVD posterior vitreous detachment; RD retinal detachment; DM diabetes mellitus; DR diabetic retinopathy; NPDR non-proliferative diabetic retinopathy; PDR proliferative diabetic retinopathy; CSME clinically significant macular edema; DME diabetic macular edema; dbh dot blot hemorrhages; CWS cotton wool spot; POAG primary open angle glaucoma; C/D cup-to-disc ratio; HVF humphrey visual field; GVF goldmann visual field; OCT optical coherence tomography; IOP intraocular pressure; BRVO Branch retinal vein occlusion; CRVO central retinal vein occlusion; CRAO central  retinal artery occlusion; BRAO branch retinal artery occlusion; RT retinal tear; SB scleral buckle; PPV pars plana vitrectomy; VH Vitreous hemorrhage; PRP panretinal laser photocoagulation; IVK intravitreal kenalog; VMT vitreomacular traction; MH Macular hole;  NVD neovascularization of the disc; NVE neovascularization elsewhere; AREDS age related eye disease study; ARMD age related macular degeneration; POAG primary open angle glaucoma; EBMD epithelial/anterior basement membrane dystrophy; ACIOL anterior chamber intraocular lens; IOL intraocular lens; PCIOL posterior chamber intraocular lens; Phaco/IOL phacoemulsification with intraocular lens placement; [place] photorefractive keratectomy; LASIK laser assisted in situ keratomileusis; HTN hypertension; DM diabetes mellitus; COPD chronic obstructive pulmonary disease

## 2019-12-28 ENCOUNTER — Encounter (INDEPENDENT_AMBULATORY_CARE_PROVIDER_SITE_OTHER): Payer: Self-pay | Admitting: Ophthalmology

## 2019-12-28 ENCOUNTER — Ambulatory Visit (INDEPENDENT_AMBULATORY_CARE_PROVIDER_SITE_OTHER): Payer: BC Managed Care – PPO | Admitting: Ophthalmology

## 2019-12-28 ENCOUNTER — Other Ambulatory Visit: Payer: Self-pay

## 2019-12-28 DIAGNOSIS — H3552 Pigmentary retinal dystrophy: Secondary | ICD-10-CM | POA: Diagnosis not present

## 2019-12-28 DIAGNOSIS — H35033 Hypertensive retinopathy, bilateral: Secondary | ICD-10-CM

## 2019-12-28 DIAGNOSIS — H353221 Exudative age-related macular degeneration, left eye, with active choroidal neovascularization: Secondary | ICD-10-CM | POA: Diagnosis not present

## 2019-12-28 DIAGNOSIS — H3581 Retinal edema: Secondary | ICD-10-CM

## 2019-12-28 DIAGNOSIS — I10 Essential (primary) hypertension: Secondary | ICD-10-CM

## 2019-12-28 DIAGNOSIS — H353112 Nonexudative age-related macular degeneration, right eye, intermediate dry stage: Secondary | ICD-10-CM

## 2019-12-28 DIAGNOSIS — H25813 Combined forms of age-related cataract, bilateral: Secondary | ICD-10-CM

## 2019-12-28 MED ORDER — AFLIBERCEPT 2MG/0.05ML IZ SOLN FOR KALEIDOSCOPE
2.0000 mg | INTRAVITREAL | Status: AC | PRN
  Administered 2019-12-28: 2 mg via INTRAVITREAL

## 2019-12-30 ENCOUNTER — Encounter: Payer: Self-pay | Admitting: Internal Medicine

## 2020-01-15 ENCOUNTER — Ambulatory Visit (INDEPENDENT_AMBULATORY_CARE_PROVIDER_SITE_OTHER): Payer: BC Managed Care – PPO | Admitting: Family Medicine

## 2020-01-15 ENCOUNTER — Encounter: Payer: Self-pay | Admitting: Family Medicine

## 2020-01-15 DIAGNOSIS — J209 Acute bronchitis, unspecified: Secondary | ICD-10-CM | POA: Diagnosis not present

## 2020-01-15 MED ORDER — BENZONATATE 100 MG PO CAPS: 100.0000 mg | ORAL_CAPSULE | Freq: Three times a day (TID) | ORAL | 0 refills | Status: DC | PRN

## 2020-01-15 MED ORDER — METHYLPREDNISOLONE 4 MG PO TBPK: ORAL_TABLET | ORAL | 0 refills | Status: DC

## 2020-01-15 NOTE — Progress Notes (Signed)
   Virtual Visit via Telephone Note  I connected with Eric Key on 01/15/20 at 9:34 AM by telephone and verified that I am speaking with the correct person using two identifiers. Eric Key is currently located at work and nobody is currently with him during this visit. The provider, Loman Brooklyn, FNP is located in their office at time of visit.  I discussed the limitations, risks, security and privacy concerns of performing an evaluation and management service by telephone and the availability of in person appointments. I also discussed with the patient that there may be a patient responsible charge related to this service. The patient expressed understanding and agreed to proceed.  Subjective: PCP: [name], Eric Kaufmann, MD  Chief Complaint  Patient presents with  . Cough   Patient complains of a productive cough with white sputum. Additional symptoms include chest congestion. Onset of symptoms was 3 weeks ago, unchanged since that time. He is drinking plenty of fluids. Evaluation to date: none. Treatment to date: cough suppressants.  He does smoke.    ROS: Per HPI No current outpatient medications on file.  Allergies  Allergen Reactions  . Penicillins Hives   Past Medical History:  Diagnosis Date  . Cataract    Mixed form OU  . Hypertension   . Hypertensive retinopathy    OU  . Macular degeneration    Dry OD; Wet OS    Observations/Objective: A&O  No respiratory distress or wheezing audible over the phone Mood, judgement, and thought processes all WNL  Assessment and Plan: 1. Acute bronchitis, unspecified organism - methylPREDNISolone (MEDROL DOSEPAK) 4 MG TBPK tablet; Use as directed  Dispense: 21 each; Refill: 0 - benzonatate (TESSALON PERLES) 100 MG capsule; Take 1 capsule (100 mg total) by mouth 3 (three) times daily as needed for cough.  Dispense: 20 capsule; Refill: 0   Follow Up Instructions:  I discussed the assessment and treatment plan with the  patient. The patient was provided an opportunity to ask questions and all were answered. The patient agreed with the plan and demonstrated an understanding of the instructions.   The patient was advised to call back or seek an in-person evaluation if the symptoms worsen or if the condition fails to improve as anticipated.  The above assessment and management plan was discussed with the patient. The patient verbalized understanding of and has agreed to the management plan. Patient is aware to call the clinic if symptoms persist or worsen. Patient is aware when to return to the clinic for a follow-up visit. Patient educated on when it is appropriate to go to the emergency department.   Time call ended: 9:38 AM  I provided 6 minutes of non-face-to-face time during this encounter.  Hendricks Limes, MSN, APRN, FNP-C [facility] 01/15/20

## 2020-01-28 NOTE — Progress Notes (Signed)
[facility] Note  02/01/2020     CHIEF COMPLAINT Patient presents for Retina Follow Up   HISTORY OF PRESENT ILLNESS: Eric Key is a 64 y.o. male who presents to the clinic today for:   HPI    Retina Follow Up    Patient presents with  Wet AMD.  Since onset it is gradually improving.  I, the attending physician,  performed the HPI with the patient and updated documentation appropriately.          Comments    5 week follow up Exu ARMD OS-  Vision has improved since his first visit here. Telephone poles are now straight where before they were squiggly.  Denies using eye drops.       Last edited by [name], MD on 02/01/2020  8:31 AM. (History)    pt feels    Referring physician: [name], [name], MD [place],  [place] [address]  HISTORICAL INFORMATION:   Selected notes from the MEDICAL RECORD NUMBER Referred by Dr. [name] for concern of mac heme OS.   CURRENT MEDICATIONS: No current outpatient medications on file. (Ophthalmic Drugs)   No current facility-administered medications for this visit. (Ophthalmic Drugs)   Current Outpatient Medications (Other)  Medication Sig  . benzonatate (TESSALON PERLES) 100 MG capsule Take 1 capsule (100 mg total) by mouth 3 (three) times daily as needed for cough. (Patient not taking: Reported on 02/01/2020)  . methylPREDNISolone (MEDROL DOSEPAK) 4 MG TBPK tablet Use as directed (Patient not taking: Reported on 02/01/2020)   No current facility-administered medications for this visit. (Other)      REVIEW OF SYSTEMS: ROS    Positive for: Eyes   Negative for: Constitutional, Gastrointestinal, Neurological, Skin, Genitourinary, Musculoskeletal, HENT, Endocrine, Cardiovascular, Respiratory, Psychiatric, Allergic/Imm, Heme/Lymph   Last edited by Leonie Douglas, COA on 02/01/2020  7:43 AM. (History)       ALLERGIES Allergies  Allergen Reactions  . Penicillins Hives     PAST MEDICAL HISTORY Past Medical History:  Diagnosis Date  . Cataract    Mixed form OU  . Hypertension   . Hypertensive retinopathy    OU  . Macular degeneration    Dry OD; Wet OS   Past Surgical History:  Procedure Laterality Date  . ACHILLES TENDON REPAIR  2010  . GALLBLADDER SURGERY      FAMILY HISTORY Family History  Problem Relation Age of Onset  . Diabetes Brother   . Heart disease Brother   . Diabetes Maternal Grandfather     SOCIAL HISTORY Social History   Tobacco Use  . Smoking status: Former Smoker    Quit date: 11/14/2005    Years since quitting: 14.2  . Smokeless tobacco: Never Used  Vaping Use  . Vaping Use: Never used  Substance Use Topics  . Alcohol use: Yes    Comment: occ  . Drug use: Never         OPHTHALMIC EXAM:  Base Eye Exam    Visual Acuity (Snellen - Linear)      Right Left   Dist cc 20/20 -2 20/40 -2   Correction: Glasses       Tonometry (Tonopen, 7:48 AM)      Right Left   Pressure 15 13       Pupils      Dark Light Shape React APD   Right 3 2 Round Brisk None   Left 3 2 Round Brisk None  Visual Fields (Counting fingers)      Left Right    Full Full       Extraocular Movement      Right Left    Full Full       Neuro/Psych    Oriented x3: Yes   Mood/Affect: Normal       Dilation    Both eyes: 1.0% Mydriacyl, 2.5% Phenylephrine @ 7:48 AM        Slit Lamp and Fundus Exam    Slit Lamp Exam      Right Left   Lids/Lashes Dermatochalasis - upper lid Dermatochalasis - upper lid   Conjunctiva/Sclera White and quiet White and quiet   Cornea 1-2+ PEE, mild Arcus, mild Debris in tear film mild Arcus, mild Debris in tear film, 2-3+ Punctate epithelial erosions   Anterior Chamber Deep and quiet Deep and quiet   Iris Round and dilated Round and dilated   Lens 2+ Nuclear sclerosis, 2+ Cortical cataract 2+ Nuclear sclerosis, 2+ Cortical cataract   Vitreous Vitreous syneresis Vitreous syneresis        Fundus Exam      Right Left   Disc Pink and Sharp, focal PPP ST, Compact Pink and Sharp, Compact   C/D Ratio 0.3 0.3   Macula Flat, Drusen, RPE mottling and clumping, early Atrophy, punctate IRH Abnormal foveal reflex, +CNV with +IRH / SRH -- resolved; +central edema -- improved, Drusen, RPE mottling and clumping   Vessels Vascular attenuation, AV crossing changes, perivascular pigment clumping peripherally, mild tortuousity Vascular attenuation, Tortuousity, perivascular pigment clumping peripherally   Periphery Attached, peripheral pigmented CR scarring and perivascular bone spicules 360 Attached, peripheral pigmented CR scarring and perivascular bone spicules 360          IMAGING AND PROCEDURES  Imaging and Procedures for _0 @  OCT, Retina - OU - Both Eyes       Right Eye Quality was good. Central Foveal Thickness: 299. Progression has been stable. Findings include normal foveal contour, no IRF, no SRF, retinal drusen , outer retinal atrophy, pigment epithelial detachment (Stable Low lying PEDs temporal macula caught on widefield).   Left Eye Quality was borderline. Central Foveal Thickness: 348. Progression has improved. Findings include abnormal foveal contour, intraretinal fluid, subretinal hyper-reflective material, pigment epithelial detachment, outer retinal atrophy, retinal drusen  (Mild Interval improvement in IRF overlying PED/SRHM).   Notes *Images captured and stored on drive  Diagnosis / Impression:  OD: non-exu ARMD  OS: exu ARMD -- mild interval improvement in IRF overlying PED/SRHM  Clinical management:  See below  Abbreviations: NFP - Normal foveal profile. CME - cystoid macular edema. PED - pigment epithelial detachment. IRF - intraretinal fluid. SRF - subretinal fluid. EZ - ellipsoid zone. ERM - epiretinal membrane. ORA - outer retinal atrophy. ORT - outer retinal tubulation. SRHM - subretinal hyper-reflective material        Intravitreal Injection,  Pharmacologic Agent - OS - Left Eye       Time Out 02/01/2020. 7:56 AM. Confirmed correct patient, procedure, site, and patient consented.   Anesthesia Topical anesthesia was used. Anesthetic medications included Lidocaine 2%, Proparacaine 0.5%.   Procedure Preparation included 5% betadine to ocular surface, eyelid speculum. A (32g) needle was used.   Injection:  2 mg aflibercept Alfonse Flavors) SOLN   NDC: [id number], Lot: [id number], Expiration date: 04/16/2020   Route: Intravitreal, Site: Left Eye, Waste: 0.05 mL  Post-op Post injection exam found visual acuity of at least counting fingers. The patient tolerated the  procedure well. There were no complications. The patient received written and verbal post procedure care education. Post injection medications were not given.                 ASSESSMENT/PLAN:    ICD-10-CM   1. Exudative age-related macular degeneration of left eye with active choroidal neovascularization (HCC)  H35.3221 Intravitreal Injection, Pharmacologic Agent - OS - Left Eye    aflibercept (EYLEA) SOLN 2 mg  2. Retinal edema  H35.81 OCT, Retina - OU - Both Eyes  3. 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 Essential hypertension  I10   6. Hypertensive retinopathy of both eyes  H35.033   7. Combined forms of age-related cataract of both eyes  H25.813     1,2. 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, OS  - s/p IVA OS #1 (03.04.21), #2 (03.04.21), #3 (04.30.21) -- IVA resistance  - s/p IVE OS #1 (06.04.21), #2 (07.08.21), #3 (08.09.21), #4 (09.13.21)  - pt reports subjective improvement in vision OS  - exam shows central CNV with +subretinal heme -- improving  - BCVA stable at 20/40 OS   - OCT shows interval decrease in CNV/PED and overlying IRF/IRHM  - recommend IVE OS #5 today, 10.18.21   - pt wishes to be treated with IVE  - RBA of procedure discussed, questions  answered  - informed consent obtained   - IVA consent form signed and scanned, 03.04.21  - IVE consent form signed and scanned, 06.04.21  - see procedure note  - Eylea4U benefits investigation started, 04.30.21 -- approved as of 06.04.21  - f/u in 5 wks -- DFE/OCT, possible injection  3. Age related macular degeneration, non-exudative, OD  - The incidence, anatomy, and pathology of dry AMD, risk of progression, and the AREDS and AREDS 2 study including smoking risks discussed with patient.  - Recommend amsler grid monitoring    4. Pigmentary retinopathy OU  - peripheral pigmented CR scarring and perivascular pigment clumping / bone spicules OU  - VA remains fairly good (20/20 OD, 20/40 OS with exudative ARMD OS)  - denies night blindness  - denies family history of vision problems  - discussed findings  - broad differential for pigmentary retinopathy:  Congenital (RP), infectious (TB, lyme dx, syphilis, toxo, bartonella), autoimmune and toxic etiologies  - labs checked (4.2.21):  All essential WNL   Rheumatoid factor   CBC   Comprehensive metabolic panel     RPR   ANA   ESR, CRP   ACE   ANCA   Toxoplasma antibodies IgG / IgM   Quantiferon - TB Gold Plus   HLA, A, B, C (IR)   Fluorescent treponemal ab (fla) - IgG - bld   VDRL Serum   Bartonella antibody, 06.04.21  - [facility] -- drawn on 6.4.21                         Results Positive for AGBL5, ALMS1, OPA1 -- unknown significance             - f/u 4-5 weeks  5,6. Hypertensive retinopathy OU  - discussed importance of tight BP control  - monitor  7. Mixed form age related cataracts OU  - The symptoms of cataract, surgical options, and treatments and risks were discussed with patient.  - discussed diagnosis and progression  - not yet visually significant  - monitor  for now    Ophthalmic Meds Ordered this visit:  Meds ordered this encounter  Medications  . aflibercept (EYLEA) SOLN 2 mg       Return  in about 5 weeks (around 03/07/2020) for f/u exu ARMD OS, DFE, OCT.  There are no Patient Instructions on file for this visit.  This document serves as a record of services personally performed by Gardiner Sleeper, MD, PhD. It was created on their behalf by Leeann Must, [place], an ophthalmic technician. The creation of this record is the provider's dictation and/or activities during the visit.    Electronically signed by: Leeann Must, [place] 10.14.2021 8:33 AM   This document serves as a record of services personally performed by Gardiner Sleeper, MD, PhD. It was created on their behalf by San Jetty. Owens Shark, OA an ophthalmic technician. The creation of this record is the provider's dictation and/or activities during the visit.    Electronically signed by: San Jetty. Owens Shark, [place] 10.18.2021 8:33 AM  Gardiner Sleeper, M.D., Ph.D. Diseases & Surgery of the Retina and [place] 02/01/2020   I have reviewed the above documentation for accuracy and completeness, and I agree with the above. Gardiner Sleeper, M.D., Ph.D. 02/01/20 8:33 AM   Abbreviations: M myopia (nearsighted); A astigmatism; H hyperopia (farsighted); P presbyopia; Mrx spectacle prescription;  CTL contact lenses; OD right eye; OS left eye; OU both eyes  XT exotropia; ET esotropia; PEK punctate epithelial keratitis; PEE punctate epithelial erosions; DES dry eye syndrome; MGD meibomian gland dysfunction; ATs artificial tears; PFAT's preservative free artificial tears; [place] nuclear sclerotic cataract; PSC posterior subcapsular cataract; ERM epi-retinal membrane; PVD posterior vitreous detachment; RD retinal detachment; DM diabetes mellitus; DR diabetic retinopathy; NPDR non-proliferative diabetic retinopathy; PDR proliferative diabetic retinopathy; CSME clinically significant macular edema; DME diabetic macular edema; dbh dot blot hemorrhages; CWS cotton wool spot; POAG primary open angle glaucoma; C/D cup-to-disc  ratio; HVF humphrey visual field; GVF goldmann visual field; OCT optical coherence tomography; IOP intraocular pressure; BRVO Branch retinal vein occlusion; CRVO central retinal vein occlusion; CRAO central retinal artery occlusion; BRAO branch retinal artery occlusion; RT retinal tear; SB scleral buckle; PPV pars plana vitrectomy; VH Vitreous hemorrhage; PRP panretinal laser photocoagulation; IVK intravitreal kenalog; VMT vitreomacular traction; MH Macular hole;  NVD neovascularization of the disc; NVE neovascularization elsewhere; AREDS age related eye disease study; ARMD age related macular degeneration; POAG primary open angle glaucoma; EBMD epithelial/anterior basement membrane dystrophy; ACIOL anterior chamber intraocular lens; IOL intraocular lens; PCIOL posterior chamber intraocular lens; Phaco/IOL phacoemulsification with intraocular lens placement; [place] photorefractive keratectomy; LASIK laser assisted in situ keratomileusis; HTN hypertension; DM diabetes mellitus; COPD chronic obstructive pulmonary disease

## 2020-02-01 ENCOUNTER — Encounter (INDEPENDENT_AMBULATORY_CARE_PROVIDER_SITE_OTHER): Payer: Self-pay | Admitting: Ophthalmology

## 2020-02-01 ENCOUNTER — Ambulatory Visit (INDEPENDENT_AMBULATORY_CARE_PROVIDER_SITE_OTHER): Payer: BC Managed Care – PPO | Admitting: Ophthalmology

## 2020-02-01 ENCOUNTER — Other Ambulatory Visit: Payer: Self-pay

## 2020-02-01 DIAGNOSIS — H353221 Exudative age-related macular degeneration, left eye, with active choroidal neovascularization: Secondary | ICD-10-CM | POA: Diagnosis not present

## 2020-02-01 DIAGNOSIS — H35033 Hypertensive retinopathy, bilateral: Secondary | ICD-10-CM

## 2020-02-01 DIAGNOSIS — H3581 Retinal edema: Secondary | ICD-10-CM | POA: Diagnosis not present

## 2020-02-01 DIAGNOSIS — I10 Essential (primary) hypertension: Secondary | ICD-10-CM

## 2020-02-01 DIAGNOSIS — H353112 Nonexudative age-related macular degeneration, right eye, intermediate dry stage: Secondary | ICD-10-CM

## 2020-02-01 DIAGNOSIS — H3552 Pigmentary retinal dystrophy: Secondary | ICD-10-CM

## 2020-02-01 DIAGNOSIS — H25813 Combined forms of age-related cataract, bilateral: Secondary | ICD-10-CM

## 2020-02-01 MED ORDER — AFLIBERCEPT 2MG/0.05ML IZ SOLN FOR KALEIDOSCOPE
2.0000 mg | INTRAVITREAL | Status: AC | PRN
  Administered 2020-02-01: 2 mg via INTRAVITREAL

## 2020-02-10 ENCOUNTER — Encounter: Payer: Self-pay | Admitting: Family Medicine

## 2020-02-10 ENCOUNTER — Telehealth: Payer: BC Managed Care – PPO | Admitting: Family

## 2020-02-10 DIAGNOSIS — J208 Acute bronchitis due to other specified organisms: Secondary | ICD-10-CM

## 2020-02-10 DIAGNOSIS — B9689 Other specified bacterial agents as the cause of diseases classified elsewhere: Secondary | ICD-10-CM | POA: Diagnosis not present

## 2020-02-10 MED ORDER — AZITHROMYCIN 250 MG PO TABS: ORAL_TABLET | ORAL | 0 refills | Status: DC

## 2020-02-10 MED ORDER — PREDNISONE 10 MG (21) PO TBPK: ORAL_TABLET | ORAL | 0 refills | Status: DC

## 2020-02-10 MED ORDER — BENZONATATE 100 MG PO CAPS: 100.0000 mg | ORAL_CAPSULE | Freq: Three times a day (TID) | ORAL | 0 refills | Status: DC | PRN

## 2020-02-10 NOTE — Progress Notes (Signed)
We are sorry that you are not feeling well.  Here is how we plan to help!  Based on your presentation I believe you most likely have A cough due to bacteria.  When patients have a fever and a productive cough with a change in color or increased sputum production, we are concerned about bacterial bronchitis.  If left untreated it can progress to pneumonia.  If your symptoms do not improve with your treatment plan it is important that you contact your provider.   I have prescribed Azithromyin 250 mg: two tablets now and then one tablet daily for 4 additonal days     In addition you may use A non-prescription cough medication called Robitussin DAC. Take 2 teaspoons every 8 hours or Delsym: take 2 teaspoons every 12 hours., A non-prescription cough medication called Mucinex DM: take 2 tablets every 12 hours. and A prescription cough medication called Tessalon Perles 100mg . You may take 1-2 capsules every 8 hours as needed for your cough.  Prednisone 10 mg daily for 6 days (see taper instructions below)  Directions for 6 day taper: Day 1: 2 tablets before breakfast, 1 after both lunch & dinner and 2 at bedtime Day 2: 1 tab before breakfast, 1 after both lunch & dinner and 2 at bedtime Day 3: 1 tab at each meal & 1 at bedtime Day 4: 1 tab at breakfast, 1 at lunch, 1 at bedtime Day 5: 1 tab at breakfast & 1 tab at bedtime Day 6: 1 tab at breakfast   *If your symptoms do not improve, you will need to be seen face to face to rule out pneumonia.   From your responses in the eVisit questionnaire you describe inflammation in the upper respiratory tract which is causing a significant cough.  This is commonly called Bronchitis and has four common causes:    Allergies  Viral Infections  Acid Reflux  Bacterial Infection Allergies, viruses and acid reflux are treated by controlling symptoms or eliminating the cause. An example might be a cough caused by taking certain blood pressure medications. You stop  the cough by changing the medication. Another example might be a cough caused by acid reflux. Controlling the reflux helps control the cough.  USE OF BRONCHODILATOR ("RESCUE") INHALERS: There is a risk from using your bronchodilator too frequently.  The risk is that over-reliance on a medication which only relaxes the muscles surrounding the breathing tubes can reduce the effectiveness of medications prescribed to reduce swelling and congestion of the tubes themselves.  Although you feel brief relief from the bronchodilator inhaler, your asthma may actually be worsening with the tubes becoming more swollen and filled with mucus.  This can delay other crucial treatments, such as oral steroid medications. If you need to use a bronchodilator inhaler daily, several times per day, you should discuss this with your provider.  There are probably better treatments that could be used to keep your asthma under control.     HOME CARE . Only take medications as instructed by your medical team. . Complete the entire course of an antibiotic. . Drink plenty of fluids and get plenty of rest. . Avoid close contacts especially the very young and the elderly . Cover your mouth if you cough or cough into your sleeve. . Always remember to wash your hands . A steam or ultrasonic humidifier can help congestion.   GET HELP RIGHT AWAY IF: . You develop worsening fever. . You become short of breath . You cough  up blood. . Your symptoms persist after you have completed your treatment plan MAKE SURE YOU   Understand these instructions.  Will watch your condition.  Will get help right away if you are not doing well or get worse.  Your e-visit answers were reviewed by a board certified advanced clinical practitioner to complete your personal care plan.  Depending on the condition, your plan could have included both over the counter or prescription medications. If there is a problem please reply  once you have received  a response from your provider. Your safety is important to [place].  If you have drug allergies check your prescription carefully.    You can use MyChart to ask questions about today's visit, request a non-urgent call back, or ask for a work or school excuse for 24 hours related to this e-Visit. If it has been greater than 24 hours you will need to follow up with your provider, or enter a new e-Visit to address those concerns. You will get an e-mail in the next two days asking about your experience.  I hope that your e-visit has been valuable and will speed your recovery. Thank you for using e-visits.  Approximately 5 minutes was spent documenting and reviewing patient's chart.    I will go ahead and treat with antibiotics today, since he failed a steroid pack on 01/15/20.

## 2020-02-22 ENCOUNTER — Other Ambulatory Visit: Payer: Self-pay

## 2020-02-22 ENCOUNTER — Ambulatory Visit (AMBULATORY_SURGERY_CENTER): Payer: Self-pay | Admitting: *Deleted

## 2020-02-22 VITALS — Ht 73.0 in | Wt 254.0 lb

## 2020-02-22 DIAGNOSIS — Z1211 Encounter for screening for malignant neoplasm of colon: Secondary | ICD-10-CM

## 2020-02-22 NOTE — Progress Notes (Signed)

## 2020-03-02 ENCOUNTER — Encounter (INDEPENDENT_AMBULATORY_CARE_PROVIDER_SITE_OTHER): Payer: BC Managed Care – PPO | Admitting: Ophthalmology

## 2020-03-04 NOTE — Progress Notes (Signed)
[facility] Note  03/09/2020     CHIEF COMPLAINT Patient presents for Retina Follow Up   HISTORY OF PRESENT ILLNESS: Eric Key is a 64 y.o. male who presents to the clinic today for:   HPI    Retina Follow Up    Patient presents with  Wet AMD.  In left eye.  This started weeks ago.  Severity is moderate.  Duration of weeks.  Since onset it is stable.  I, the attending physician,  performed the HPI with the patient and updated documentation appropriately.          Comments    Pt states vision is about the same OU.  Pt denies eye pain or discomfort and denies any new or worsening floaters or fol OU.       Last edited by [name], MD on 03/09/2020  8:42 AM. (History)    pt    Referring physician: [name], [name], MD [place],  [place] [address]  HISTORICAL INFORMATION:   Selected notes from the MEDICAL RECORD NUMBER Referred by Dr. [name] for concern of mac heme OS.   CURRENT MEDICATIONS: No current outpatient medications on file. (Ophthalmic Drugs)   No current facility-administered medications for this visit. (Ophthalmic Drugs)   No current outpatient medications on file. (Other)   No current facility-administered medications for this visit. (Other)      REVIEW OF SYSTEMS: ROS    Positive for: Eyes   Negative for: Constitutional, Gastrointestinal, Neurological, Skin, Genitourinary, Musculoskeletal, HENT, Endocrine, Cardiovascular, Respiratory, Psychiatric, Allergic/Imm, Heme/Lymph   Last edited by Doneen Poisson on 03/09/2020  7:50 AM. (History)       ALLERGIES Allergies  Allergen Reactions  . Penicillins Hives    PAST MEDICAL HISTORY Past Medical History:  Diagnosis Date  . Cataract    Mixed form OU  . Hypertension    past hx   . Hypertensive retinopathy    OU  . Macular degeneration    Dry OD; Wet OS   Past Surgical History:  Procedure Laterality Date  . ACHILLES TENDON REPAIR   2010  . CHOLECYSTECTOMY    . COLONOSCOPY    . GALLBLADDER SURGERY      FAMILY HISTORY Family History  Problem Relation Age of Onset  . Diabetes Brother   . Heart disease Brother   . Diabetes Maternal Grandfather   . Colon polyps Neg Hx   . Colon cancer Neg Hx   . Esophageal cancer Neg Hx   . Rectal cancer Neg Hx   . Stomach cancer Neg Hx     SOCIAL HISTORY Social History   Tobacco Use  . Smoking status: Former Smoker    Quit date: 02/05/2020    Years since quitting: 0.0  . Smokeless tobacco: Never Used  Vaping Use  . Vaping Use: Never used  Substance Use Topics  . Alcohol use: Yes    Comment: occ  . Drug use: Never         OPHTHALMIC EXAM:  Base Eye Exam    Visual Acuity (Snellen - Linear)      Right Left   Dist cc 20/20 -2 20/50 -1   Dist ph cc  20/50 +1       Tonometry (Tonopen, 7:54 AM)      Right Left   Pressure 17 13       Pupils      Dark Light Shape React APD  Right 2 1 Round Brisk 0   Left 2 1 Round Brisk 0       Visual Fields      Left Right    Full Full       Extraocular Movement      Right Left    Full Full       Neuro/Psych    Oriented x3: Yes   Mood/Affect: Normal       Dilation    Both eyes: 1.0% Mydriacyl, 2.5% Phenylephrine @ 7:54 AM        Slit Lamp and Fundus Exam    Slit Lamp Exam      Right Left   Lids/Lashes Dermatochalasis - upper lid Dermatochalasis - upper lid   Conjunctiva/Sclera White and quiet White and quiet   Cornea 1-2+ PEE, mild Arcus, mild Debris in tear film mild Arcus, mild Debris in tear film, 2-3+ Punctate epithelial erosions   Anterior Chamber Deep and quiet Deep and quiet   Iris Round and dilated Round and dilated   Lens 2+ Nuclear sclerosis, 2+ Cortical cataract 2+ Nuclear sclerosis, 2+ Cortical cataract   Vitreous Vitreous syneresis Vitreous syneresis       Fundus Exam      Right Left   Disc Pink and Sharp, focal PPP ST, Compact Pink and Sharp, Compact   C/D Ratio 0.3 0.3   Macula  Flat, Drusen, RPE mottling and clumping, early Atrophy, punctate IRH Abnormal foveal reflex, +CNV with heme improved -- just punctate heme remains; +central edema -- improved, Drusen, RPE mottling and clumping   Vessels Vascular attenuation, AV crossing changes, perivascular pigment clumping peripherally, mild tortuousity Vascular attenuation, Tortuousity, perivascular pigment clumping peripherally   Periphery Attached, peripheral pigmented CR scarring and perivascular bone spicules 360 Attached, peripheral pigmented CR scarring and perivascular bone spicules 360        Refraction    Wearing Rx      Sphere Cylinder Axis   Right [place] +1.00 171   Left +0.25 +0.50 174          IMAGING AND PROCEDURES  Imaging and Procedures for _0 @  OCT, Retina - OU - Both Eyes       Right Eye Quality was good. Central Foveal Thickness: 301. Progression has been stable. Findings include normal foveal contour, no IRF, no SRF, retinal drusen , outer retinal atrophy, pigment epithelial detachment (Stable Low lying PEDs temporal macula caught on widefield).   Left Eye Quality was borderline. Central Foveal Thickness: 323. Progression has improved. Findings include abnormal foveal contour, intraretinal fluid, subretinal hyper-reflective material, pigment epithelial detachment, outer retinal atrophy, retinal drusen  (Interval improvement in IRF overlying PED/SRHM).   Notes *Images captured and stored on drive  Diagnosis / Impression:  OD: non-exu ARMD  OS: exu ARMD -- interval improvement in IRF overlying PED/SRHM  Clinical management:  See below  Abbreviations: NFP - Normal foveal profile. CME - cystoid macular edema. PED - pigment epithelial detachment. IRF - intraretinal fluid. SRF - subretinal fluid. EZ - ellipsoid zone. ERM - epiretinal membrane. ORA - outer retinal atrophy. ORT - outer retinal tubulation. SRHM - subretinal hyper-reflective material        Intravitreal Injection,  Pharmacologic Agent - OS - Left Eye       Time Out 03/09/2020. 8:37 AM. Confirmed correct patient, procedure, site, and patient consented.   Anesthesia Topical anesthesia was used. Anesthetic medications included Lidocaine 2%, Proparacaine 0.5%.   Procedure Preparation included 5% betadine to ocular surface, eyelid  speculum. A (32g) needle was used.   Injection:  2 mg aflibercept Alfonse Flavors) SOLN   NDC: [id number], Lot: [id number], Expiration date: 06/13/2020   Route: Intravitreal, Site: Left Eye, Waste: 0.05 mL  Post-op Post injection exam found visual acuity of at least counting fingers. The patient tolerated the procedure well. There were no complications. The patient received written and verbal post procedure care education. Post injection medications were not given.                 ASSESSMENT/PLAN:    ICD-10-CM   1. Exudative age-related macular degeneration of left eye with active choroidal neovascularization (HCC)  H35.3221 Intravitreal Injection, Pharmacologic Agent - OS - Left Eye    aflibercept (EYLEA) SOLN 2 mg  2. Retinal edema  H35.81 OCT, Retina - OU - Both Eyes  3. 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 Essential hypertension  I10   6. Hypertensive retinopathy of both eyes  H35.033   7. Combined forms of age-related cataract of both eyes  H25.813     1,2. 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, OS  - s/p IVA OS #1 (03.04.21), #2 (03.04.21), #3 (04.30.21) -- IVA resistance  - s/p IVE OS #1 (06.04.21), #2 (07.08.21), #3 (08.09.21), #4 (09.13.21), #5 (10.18.21)  - pt reports subjective improvement in vision OS  - exam shows central CNV with +subretinal heme -- improving  - BCVA 20/50 OS   - OCT shows interval improvement in CNV/PED and overlying IRF/IRHM  - recommend IVE OS #6 today, 11.24.21   - pt wishes to be treated with IVE  - RBA of procedure discussed,  questions answered  - informed consent obtained   - IVA consent form signed and scanned, 03.04.21  - IVE consent form signed and scanned, 06.04.21  - see procedure note  - Eylea4U benefits investigation started, 04.30.21 -- approved as of 06.04.21  - f/u in 4-5 wks -- DFE/OCT, possible injection  3. Age related macular degeneration, non-exudative, OD  - The incidence, anatomy, and pathology of dry AMD, risk of progression, and the AREDS and AREDS 2 study including smoking risks discussed with patient.  - Recommend amsler grid monitoring    4. Pigmentary retinopathy OU  - peripheral pigmented CR scarring and perivascular pigment clumping / bone spicules OU  - VA remains fairly good (20/20 OD, 20/40 OS with exudative ARMD OS)  - denies night blindness  - denies family history of vision problems  - discussed findings  - broad differential for pigmentary retinopathy:  Congenital (RP), infectious (TB, lyme dx, syphilis, toxo, bartonella), autoimmune and toxic etiologies  - labs checked (4.2.21):  All essential WNL   Rheumatoid factor   CBC   Comprehensive metabolic panel     RPR   ANA   ESR, CRP   ACE   ANCA   Toxoplasma antibodies IgG / IgM   Quantiferon - TB Gold Plus   HLA, A, B, C (IR)   Fluorescent treponemal ab (fla) - IgG - bld   VDRL Serum   Bartonella antibody, 06.04.21  - [facility] -- drawn on 6.4.21                         Results Positive for AGBL5, ALMS1, OPA1 -- unknown significance             - f/u 4-5 weeks  5,6. Hypertensive retinopathy  OU  - discussed importance of tight BP control  - monitor  7. Mixed form age related cataracts OU  - The symptoms of cataract, surgical options, and treatments and risks were discussed with patient.  - discussed diagnosis and progression  - not yet visually significant  - monitor for now    Ophthalmic Meds Ordered this visit:  Meds ordered this encounter  Medications  . aflibercept (EYLEA) SOLN 2 mg        Return for 4-5 wks - ex ARMD OS, Dilated Exam, OCT, Possible Injxn.  There are no Patient Instructions on file for this visit.  This document serves as a record of services personally performed by Gardiner Sleeper, MD, PhD. It was created on their behalf by Leeann Must, [place], an ophthalmic technician. The creation of this record is the provider's dictation and/or activities during the visit.    Electronically signed by: Leeann Must, Nathalie 11.19.2021 8:48 AM   This document serves as a record of services personally performed by Gardiner Sleeper, MD, PhD. It was created on their behalf by San Jetty. Owens Shark, OA an ophthalmic technician. The creation of this record is the provider's dictation and/or activities during the visit.    Electronically signed by: San Jetty. Owens Shark, [place] 11.24.2021 8:48 AM  Gardiner Sleeper, M.D., Ph.D. Diseases & Surgery of the Retina and [place] 03/09/2020   I have reviewed the above documentation for accuracy and completeness, and I agree with the above. Gardiner Sleeper, M.D., Ph.D. 03/09/20 8:48 AM   Abbreviations: M myopia (nearsighted); A astigmatism; H hyperopia (farsighted); P presbyopia; Mrx spectacle prescription;  CTL contact lenses; OD right eye; OS left eye; OU both eyes  XT exotropia; ET esotropia; PEK punctate epithelial keratitis; PEE punctate epithelial erosions; DES dry eye syndrome; MGD meibomian gland dysfunction; ATs artificial tears; PFAT's preservative free artificial tears; [place] nuclear sclerotic cataract; PSC posterior subcapsular cataract; ERM epi-retinal membrane; PVD posterior vitreous detachment; RD retinal detachment; DM diabetes mellitus; DR diabetic retinopathy; NPDR non-proliferative diabetic retinopathy; PDR proliferative diabetic retinopathy; CSME clinically significant macular edema; DME diabetic macular edema; dbh dot blot hemorrhages; CWS cotton wool spot; POAG primary open angle glaucoma; C/D  cup-to-disc ratio; HVF humphrey visual field; GVF goldmann visual field; OCT optical coherence tomography; IOP intraocular pressure; BRVO Branch retinal vein occlusion; CRVO central retinal vein occlusion; CRAO central retinal artery occlusion; BRAO branch retinal artery occlusion; RT retinal tear; SB scleral buckle; PPV pars plana vitrectomy; VH Vitreous hemorrhage; PRP panretinal laser photocoagulation; IVK intravitreal kenalog; VMT vitreomacular traction; MH Macular hole;  NVD neovascularization of the disc; NVE neovascularization elsewhere; AREDS age related eye disease study; ARMD age related macular degeneration; POAG primary open angle glaucoma; EBMD epithelial/anterior basement membrane dystrophy; ACIOL anterior chamber intraocular lens; IOL intraocular lens; PCIOL posterior chamber intraocular lens; Phaco/IOL phacoemulsification with intraocular lens placement; [place] photorefractive keratectomy; LASIK laser assisted in situ keratomileusis; HTN hypertension; DM diabetes mellitus; COPD chronic obstructive pulmonary disease

## 2020-03-07 ENCOUNTER — Ambulatory Visit (AMBULATORY_SURGERY_CENTER): Payer: BC Managed Care – PPO | Admitting: Internal Medicine

## 2020-03-07 ENCOUNTER — Encounter: Payer: Self-pay | Admitting: Internal Medicine

## 2020-03-07 ENCOUNTER — Other Ambulatory Visit: Payer: Self-pay

## 2020-03-07 VITALS — BP 129/84 | HR 85 | Temp 98.6°F | Resp 10 | Ht 73.0 in | Wt 254.0 lb

## 2020-03-07 DIAGNOSIS — D123 Benign neoplasm of transverse colon: Secondary | ICD-10-CM | POA: Diagnosis not present

## 2020-03-07 DIAGNOSIS — D125 Benign neoplasm of sigmoid colon: Secondary | ICD-10-CM | POA: Diagnosis not present

## 2020-03-07 DIAGNOSIS — Z1211 Encounter for screening for malignant neoplasm of colon: Secondary | ICD-10-CM | POA: Diagnosis not present

## 2020-03-07 MED ORDER — SODIUM CHLORIDE 0.9 % IV SOLN: 500.0000 mL | Freq: Once | INTRAVENOUS | Status: DC

## 2020-03-07 NOTE — Progress Notes (Signed)
Called to room to assist during endoscopic procedure.  Patient ID and intended procedure confirmed with present staff. Received instructions for my participation in the procedure from the performing physician.  

## 2020-03-07 NOTE — Progress Notes (Signed)
A and O x3. Report to RN. Tolerated MAC anesthesia well.

## 2020-03-07 NOTE — Patient Instructions (Addendum)
I removed 2 tiny polyps that look benign.  You also have a condition called diverticulosis - common and not usually a problem. Please read the handout provided.  I will let you know pathology results and when to have another routine colonoscopy by mail and/or My Chart.  I appreciate the opportunity to care for you. Gatha Mayer, MD, [facility]  Handouts provided on polyps and diverticulosis.    YOU HAD AN ENDOSCOPIC PROCEDURE TODAY AT [facility]:   Refer to the procedure report that was given to you for any specific questions about what was found during the examination.  If the procedure report does not answer your questions, please call your gastroenterologist to clarify.  If you requested that your care partner not be given the details of your procedure findings, then the procedure report has been included in a sealed envelope for you to review at your convenience later.  YOU SHOULD EXPECT: Some feelings of bloating in the abdomen. Passage of more gas than usual.  Walking can help get rid of the air that was put into your GI tract during the procedure and reduce the bloating. If you had a lower endoscopy (such as a colonoscopy or flexible sigmoidoscopy) you may notice spotting of blood in your stool or on the toilet paper. If you underwent a bowel prep for your procedure, you may not have a normal bowel movement for a few days.  Please Note:  You might notice some irritation and congestion in your nose or some drainage.  This is from the oxygen used during your procedure.  There is no need for concern and it should clear up in a day or so.  SYMPTOMS TO REPORT IMMEDIATELY:   Following lower endoscopy (colonoscopy or flexible sigmoidoscopy):  Excessive amounts of blood in the stool  Significant tenderness or worsening of abdominal pains  Swelling of the abdomen that is new, acute  Fever of 100F or higher  For urgent or emergent issues, a gastroenterologist can be reached at  any hour by calling [phone]. Do not use MyChart messaging for urgent concerns.    DIET:  We do recommend a small meal at first, but then you may proceed to your regular diet.  Drink plenty of fluids but you should avoid alcoholic beverages for 24 hours.  ACTIVITY:  You should plan to take it easy for the rest of today and you should NOT DRIVE or use heavy machinery until tomorrow (because of the sedation medicines used during the test).    FOLLOW UP: Our staff will call the number listed on your records 48-72 hours following your procedure to check on you and address any questions or concerns that you may have regarding the information given to you following your procedure. If we do not reach you, we will leave a message.  We will attempt to reach you two times.  During this call, we will ask if you have developed any symptoms of COVID 19. If you develop any symptoms (ie: fever, flu-like symptoms, shortness of breath, cough etc.) before then, please call [phone].  If you test positive for Covid 19 in the 2 weeks post procedure, please call and report this information to [place].    If any biopsies were taken you will be contacted by phone or by letter within the next 1-3 weeks.  Please call us at [phone] if you have not heard about the biopsies in 3 weeks.    SIGNATURES/CONFIDENTIALITY: You and/or your  care partner have signed paperwork which will be entered into your electronic medical record.  These signatures attest to the fact that that the information above on your After Visit Summary has been reviewed and is understood.  Full responsibility of the confidentiality of this discharge information lies with you and/or your care-partner.

## 2020-03-07 NOTE — Progress Notes (Signed)
Pt's states no medical or surgical changes since previsit or office visit. VS by CW. 

## 2020-03-07 NOTE — Op Note (Signed)
[facility] Patient Name: Eric Key Procedure Date: 03/07/2020 8:44 AM MRN: [id number] Endoscopist: Gatha Mayer , MD Age: 64 Referring MD:  Date of Birth: May 11, 1955 Gender: Male Account #: [id number] Procedure:                Colonoscopy Indications:              Screening for colorectal malignant neoplasm, Last                            colonoscopy: 2007 Medicines:                Propofol per Anesthesia, Monitored Anesthesia Care Procedure:                Pre-Anesthesia Assessment:                           - Prior to the procedure, a History and Physical                            was performed, and patient medications and                            allergies were reviewed. The patient's tolerance of                            previous anesthesia was also reviewed. The risks                            and benefits of the procedure and the sedation                            options and risks were discussed with the patient.                            All questions were answered, and informed consent                            was obtained. Prior Anticoagulants: The patient has                            taken no previous anticoagulant or antiplatelet                            agents. ASA Grade Assessment: II - A patient with                            mild systemic disease. After reviewing the risks                            and benefits, the patient was deemed in                            satisfactory condition to undergo the procedure.  After obtaining informed consent, the colonoscope                            was passed under direct vision. Throughout the                            procedure, the patient's blood pressure, pulse, and                            oxygen saturations were monitored continuously. The                            Colonoscope was introduced through the anus and                            advanced to the the  cecum, identified by                            appendiceal orifice and ileocecal valve. The                            colonoscopy was performed without difficulty. The                            patient tolerated the procedure well. The quality                            of the bowel preparation was good. The ileocecal                            valve, appendiceal orifice, and rectum were                            photographed. The bowel preparation used was                            Miralax via split dose instruction. Scope In: 8:55:10 AM Scope Out: 9:09:35 AM Scope Withdrawal Time: 0 hours 12 minutes 22 seconds  Total Procedure Duration: 0 hours 14 minutes 25 seconds  Findings:                 The perianal and digital rectal examinations were                            normal. Pertinent negatives include normal prostate                            (size, shape, and consistency).                           Two sessile polyps were found in the sigmoid colon                            and transverse colon. The polyps were diminutive in  size. These polyps were removed with a cold snare.                            Resection and retrieval were complete. Verification                            of patient identification for the specimen was                            done. Estimated blood loss was minimal.                           Multiple small and large-mouthed diverticula were                            found in the sigmoid colon.                           The exam was otherwise without abnormality on                            direct and retroflexion views. Complications:            No immediate complications. Estimated Blood Loss:     Estimated blood loss was minimal. Impression:               - Two diminutive polyps in the sigmoid colon and in                            the transverse colon, removed with a cold snare.                            Resected and  retrieved.                           - Diverticulosis in the sigmoid colon.                           - The examination was otherwise normal on direct                            and retroflexion views. Recommendation:           - Patient has a contact number available for                            emergencies. The signs and symptoms of potential                            delayed complications were discussed with the                            patient. Return to normal activities tomorrow.                            Written discharge instructions  were provided to the                            patient.                           - Resume previous diet.                           - Continue present medications.                           - Repeat colonoscopy is recommended. The                            colonoscopy date will be determined after pathology                            results from today's exam become available for                            review. Gatha Mayer, MD 03/07/2020 9:16:28 AM This report has been signed electronically.

## 2020-03-08 ENCOUNTER — Telehealth: Payer: Self-pay | Admitting: *Deleted

## 2020-03-08 NOTE — Telephone Encounter (Signed)
°  Follow up Call-  Call back number 03/07/2020  Post procedure Call Back phone  # [phone]  Permission to leave phone message Yes  Some recent data might be hidden     Patient questions:  Do you have a fever, pain , or abdominal swelling? No. Pain Score  0 *  Have you tolerated food without any problems? Yes.    Have you been able to return to your normal activities? Yes.    Do you have any questions about your discharge instructions: Diet   No. Medications  No. Follow up visit  No.  Do you have questions or concerns about your Care? No.  Actions: * If pain score is 4 or above: No action needed, pain <4.  1. Have you developed a fever since your procedure? no  2.   Have you had an respiratory symptoms (SOB or cough) since your procedure? no  3.   Have you tested positive for COVID 19 since your procedure no  4.   Have you had any family members/close contacts diagnosed with the COVID 19 since your procedure?  no   If yes to any of these questions please route to Joylene John, RN and Joella Prince, RN

## 2020-03-09 ENCOUNTER — Ambulatory Visit (INDEPENDENT_AMBULATORY_CARE_PROVIDER_SITE_OTHER): Payer: BC Managed Care – PPO | Admitting: Ophthalmology

## 2020-03-09 ENCOUNTER — Other Ambulatory Visit: Payer: Self-pay

## 2020-03-09 ENCOUNTER — Encounter (INDEPENDENT_AMBULATORY_CARE_PROVIDER_SITE_OTHER): Payer: Self-pay | Admitting: Ophthalmology

## 2020-03-09 DIAGNOSIS — H3552 Pigmentary retinal dystrophy: Secondary | ICD-10-CM

## 2020-03-09 DIAGNOSIS — H353112 Nonexudative age-related macular degeneration, right eye, intermediate dry stage: Secondary | ICD-10-CM | POA: Diagnosis not present

## 2020-03-09 DIAGNOSIS — H353221 Exudative age-related macular degeneration, left eye, with active choroidal neovascularization: Secondary | ICD-10-CM | POA: Diagnosis not present

## 2020-03-09 DIAGNOSIS — H25813 Combined forms of age-related cataract, bilateral: Secondary | ICD-10-CM

## 2020-03-09 DIAGNOSIS — H3581 Retinal edema: Secondary | ICD-10-CM

## 2020-03-09 DIAGNOSIS — I10 Essential (primary) hypertension: Secondary | ICD-10-CM

## 2020-03-09 DIAGNOSIS — H35033 Hypertensive retinopathy, bilateral: Secondary | ICD-10-CM

## 2020-03-09 MED ORDER — AFLIBERCEPT 2MG/0.05ML IZ SOLN FOR KALEIDOSCOPE
2.0000 mg | INTRAVITREAL | Status: AC | PRN
  Administered 2020-03-09: 2 mg via INTRAVITREAL

## 2020-03-16 ENCOUNTER — Encounter: Payer: Self-pay | Admitting: Internal Medicine

## 2020-03-16 DIAGNOSIS — Z860101 Personal history of adenomatous and serrated colon polyps: Secondary | ICD-10-CM

## 2020-03-16 DIAGNOSIS — Z86010 Personal history of colonic polyps: Secondary | ICD-10-CM

## 2020-03-16 HISTORY — DX: Personal history of adenomatous and serrated colon polyps: Z86.0101

## 2020-03-16 HISTORY — DX: Personal history of colonic polyps: Z86.010

## 2020-03-31 NOTE — Progress Notes (Signed)
[facility] Note  04/04/2020     CHIEF COMPLAINT Patient presents for Retina Follow Up   HISTORY OF PRESENT ILLNESS: Eric Key is a 64 y.o. male who presents to the clinic today for:   HPI    Retina Follow Up    Patient presents with  Wet AMD.  In left eye.  This started 4.  Duration of weeks.          Comments    Patient here for 4 weeks retina follow up for exu ARMD OS.  Patient states vision about normal. No eye pain.       Last edited by Theodore Demark, [place] on 04/04/2020  7:49 AM. (History)    pt    Referring physician: [name], [name], MD [place],   [id number]  HISTORICAL INFORMATION:   Selected notes from the MEDICAL RECORD NUMBER Referred by Dr. [name] for concern of mac heme OS.   CURRENT MEDICATIONS: No current outpatient medications on file. (Ophthalmic Drugs)   No current facility-administered medications for this visit. (Ophthalmic Drugs)   No current outpatient medications on file. (Other)   No current facility-administered medications for this visit. (Other)      REVIEW OF SYSTEMS: ROS    Positive for: Eyes   Negative for: Constitutional, Gastrointestinal, Neurological, Skin, Genitourinary, Musculoskeletal, HENT, Endocrine, Cardiovascular, Respiratory, Psychiatric, Allergic/Imm, Heme/Lymph   Last edited by Theodore Demark, COA on 04/04/2020  7:49 AM. (History)       ALLERGIES Allergies  Allergen Reactions  . Penicillins Hives    PAST MEDICAL HISTORY Past Medical History:  Diagnosis Date  . Cataract    Mixed form OU  . History of adenomatous polyps of colon 03/16/2020  . Hypertension    past hx   . Hypertensive retinopathy    OU  . Macular degeneration    Dry OD; Wet OS   Past Surgical History:  Procedure Laterality Date  . ACHILLES TENDON REPAIR  2010  . CHOLECYSTECTOMY    . COLONOSCOPY    . GALLBLADDER SURGERY      FAMILY HISTORY Family History   Problem Relation Age of Onset  . Diabetes Brother   . Heart disease Brother   . Diabetes Maternal Grandfather   . Colon polyps Neg Hx   . Colon cancer Neg Hx   . Esophageal cancer Neg Hx   . Rectal cancer Neg Hx   . Stomach cancer Neg Hx     SOCIAL HISTORY Social History   Tobacco Use  . Smoking status: Former Smoker    Quit date: 02/05/2020    Years since quitting: 0.1  . Smokeless tobacco: Never Used  Vaping Use  . Vaping Use: Never used  Substance Use Topics  . Alcohol use: Yes    Comment: occ  . Drug use: Never         OPHTHALMIC EXAM:  Base Eye Exam    Visual Acuity (Snellen - Linear)      Right Left   Dist cc 20/20 -1 20/50   Dist ph cc  NI   Correction: Glasses       Tonometry (Tonopen, 7:46 AM)      Right Left   Pressure 11 9       Pupils      Dark Light Shape React APD   Right 3 2 Round Brisk None   Left 3 2 Round Brisk None  Visual Fields (Counting fingers)      Left Right    Full Full       Extraocular Movement      Right Left    Full Full       Neuro/Psych    Oriented x3: Yes   Mood/Affect: Normal       Dilation    Both eyes: 1.0% Mydriacyl, 2.5% Phenylephrine @ 7:46 AM        Slit Lamp and Fundus Exam    Slit Lamp Exam      Right Left   Lids/Lashes Dermatochalasis - upper lid Dermatochalasis - upper lid   Conjunctiva/Sclera White and quiet White and quiet   Cornea 1-2+ PEE, mild Arcus, mild Debris in tear film mild Arcus, mild Debris in tear film, 2-3+ Punctate epithelial erosions   Anterior Chamber Deep and quiet Deep and quiet   Iris Round and dilated Round and dilated   Lens 2+ Nuclear sclerosis, 2+ Cortical cataract 2+ Nuclear sclerosis, 2+ Cortical cataract   Vitreous Vitreous syneresis Vitreous syneresis       Fundus Exam      Right Left   Disc Pink and Sharp, focal PPP ST, Compact Pink and Sharp, Compact   C/D Ratio 0.3 0.3   Macula Flat, Drusen, RPE mottling and clumping, early Atrophy, punctate IRH  Abnormal foveal reflex, +CNV with heme improved -- just punctate heme remains; +central edema -- improved, Drusen, RPE mottling and clumping   Vessels Vascular attenuation, AV crossing changes, perivascular pigment clumping peripherally, mild tortuousity Vascular attenuation, Tortuousity, perivascular pigment clumping peripherally   Periphery Attached, peripheral pigmented CR scarring and perivascular bone spicules 360 Attached, peripheral pigmented CR scarring and perivascular bone spicules 360        Refraction    Wearing Rx      Sphere Cylinder Axis   Right [place] +1.00 171   Left +0.25 +0.50 174          IMAGING AND PROCEDURES  Imaging and Procedures for _0 @  OCT, Retina - OU - Both Eyes       Right Eye Quality was good. Central Foveal Thickness: 297. Progression has been stable. Findings include normal foveal contour, no IRF, no SRF, retinal drusen , outer retinal atrophy, pigment epithelial detachment (Stable Low lying PEDs temporal macula caught on widefield).   Left Eye Quality was borderline. Central Foveal Thickness: 337. Progression has been stable. Findings include abnormal foveal contour, intraretinal fluid, subretinal hyper-reflective material, pigment epithelial detachment, outer retinal atrophy, retinal drusen  (Persistent, mild IRF overlying PED/SRHM).   Notes *Images captured and stored on drive  Diagnosis / Impression:  OD: non-exu ARMD  OS: exu ARMD -- Persistent, mild IRF overlying PED/SRHM  Clinical management:  See below  Abbreviations: NFP - Normal foveal profile. CME - cystoid macular edema. PED - pigment epithelial detachment. IRF - intraretinal fluid. SRF - subretinal fluid. EZ - ellipsoid zone. ERM - epiretinal membrane. ORA - outer retinal atrophy. ORT - outer retinal tubulation. SRHM - subretinal hyper-reflective material        Intravitreal Injection, Pharmacologic Agent - OS - Left Eye       Time Out 04/04/2020. 8:03 AM. Confirmed  correct patient, procedure, site, and patient consented.   Anesthesia Topical anesthesia was used. Anesthetic medications included Lidocaine 2%, Proparacaine 0.5%.   Procedure Preparation included 5% betadine to ocular surface, eyelid speculum. A (32g) needle was used.   Injection:  2 mg aflibercept Alfonse Flavors) SOLN   NDC: 89381-017-51  Route: Intravitreal, Site: Left Eye, Waste: 0 mg  Post-op Post injection exam found visual acuity of at least counting fingers. The patient tolerated the procedure well. There were no complications. The patient received written and verbal post procedure care education. Post injection medications were not given.   Notes **NO INJECTION ADMINISTERED TODAY 12.20.21 -- ERRONEOUS ORDER**                ASSESSMENT/PLAN:    ICD-10-CM   1. Exudative age-related macular degeneration of left eye with active choroidal neovascularization (HCC)  H35.3221 Intravitreal Injection, Pharmacologic Agent - OS - Left Eye    aflibercept (EYLEA) SOLN 2 mg  2. Retinal edema  H35.81 OCT, Retina - OU - Both Eyes  3. 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 Essential hypertension  I10   6. Hypertensive retinopathy of both eyes  H35.033   7. Combined forms of age-related cataract of both eyes  H25.813     **Pt was scheduled for his appointment at 26 days rather than 28 or greater**  - rescheduled for 12.31.21  1,2. 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, OS  - s/p IVA OS #1 (03.04.21), #2 (03.04.21), #3 (04.30.21) -- IVA resistance  - s/p IVE OS #1 (06.04.21), #2 (07.08.21), #3 (08.09.21), #4 (09.13.21), #5 (10.18.21), #6 (11.24.21)  - pt reports subjective improvement in vision OS  - exam shows central CNV with +subretinal heme -- improving  - BCVA 20/50 OS   - OCT shows interval improvement in CNV/PED and overlying IRF/IRHM  - pt is here 2 days too early to receive  injection -- will come back next Friday to receive it  - IVA consent form signed and scanned, 03.04.21  - IVE consent form signed and scanned, 06.04.21  - [employer] benefits investigation started, 04.30.21 -- approved as of 06.04.21  - f/u in 4-5 wks -- DFE/OCT, possible injection  3. Age related macular degeneration, non-exudative, OD  - The incidence, anatomy, and pathology of dry AMD, risk of progression, and the AREDS and AREDS 2 study including smoking risks discussed with patient.  - Recommend amsler grid monitoring    4. Pigmentary retinopathy OU  - peripheral pigmented CR scarring and perivascular pigment clumping / bone spicules OU  - VA remains fairly good (20/20 OD, 20/40 OS with exudative ARMD OS)  - denies night blindness  - denies family history of vision problems  - discussed findings  - broad differential for pigmentary retinopathy:  Congenital (RP), infectious (TB, lyme dx, syphilis, toxo, bartonella), autoimmune and toxic etiologies  - labs checked (4.2.21):  All essential WNL   Rheumatoid factor   CBC   Comprehensive metabolic panel     RPR   ANA   ESR, CRP   ACE   ANCA   Toxoplasma antibodies IgG / IgM   Quantiferon - TB Gold Plus   HLA, A, B, C (IR)   Fluorescent treponemal ab (fla) - IgG - bld   VDRL Serum   Bartonella antibody, 06.04.21  - [facility] -- drawn on 6.4.21                         Results Positive for AGBL5, ALMS1, OPA1 -- unknown significance             - f/u 4-5 weeks  5,6. Hypertensive retinopathy OU  - discussed importance of tight BP control  -  monitor  7. Mixed form age related cataracts OU  - The symptoms of cataract, surgical options, and treatments and risks were discussed with patient.  - discussed diagnosis and progression  - not yet visually significant  - monitor for now    Ophthalmic Meds Ordered this visit:  Meds ordered this encounter  Medications  . aflibercept (EYLEA) SOLN 2 mg       Return for f/u  12.31.21 exu ARMD OS, DFE, OCT.  There are no Patient Instructions on file for this visit.  This document serves as a record of services personally performed by Gardiner Sleeper, MD, PhD. It was created on their behalf by Leeann Must, [place], an ophthalmic technician. The creation of this record is the provider's dictation and/or activities during the visit.    Electronically signed by: Leeann Must, COA 12.16.2021 8:29 AM  This document serves as a record of services personally performed by Gardiner Sleeper, MD, PhD. It was created on their behalf by San Jetty. Owens Shark, OA an ophthalmic technician. The creation of this record is the provider's dictation and/or activities during the visit.    Electronically signed by: San Jetty. Owens Shark, [place] 12.20.2021 8:29 AM  Gardiner Sleeper, M.D., Ph.D. Diseases & Surgery of the [facility] and [place] 04/04/2020   I have reviewed the above documentation for accuracy and completeness, and I agree with the above. Gardiner Sleeper, M.D., Ph.D. 04/04/20 8:29 AM    Abbreviations: M myopia (nearsighted); A astigmatism; H hyperopia (farsighted); P presbyopia; Mrx spectacle prescription;  CTL contact lenses; OD right eye; OS left eye; OU both eyes  XT exotropia; ET esotropia; PEK punctate epithelial keratitis; PEE punctate epithelial erosions; DES dry eye syndrome; MGD meibomian gland dysfunction; ATs artificial tears; PFAT's preservative free artificial tears; [place] nuclear sclerotic cataract; PSC posterior subcapsular cataract; ERM epi-retinal membrane; PVD posterior vitreous detachment; RD retinal detachment; DM diabetes mellitus; DR diabetic retinopathy; NPDR non-proliferative diabetic retinopathy; PDR proliferative diabetic retinopathy; CSME clinically significant macular edema; DME diabetic macular edema; dbh dot blot hemorrhages; CWS cotton wool spot; POAG primary open angle glaucoma; C/D cup-to-disc ratio; HVF humphrey visual field; GVF  goldmann visual field; OCT optical coherence tomography; IOP intraocular pressure; BRVO Branch retinal vein occlusion; CRVO central retinal vein occlusion; CRAO central retinal artery occlusion; BRAO branch retinal artery occlusion; RT retinal tear; SB scleral buckle; PPV pars plana vitrectomy; VH Vitreous hemorrhage; PRP panretinal laser photocoagulation; IVK intravitreal kenalog; VMT vitreomacular traction; MH Macular hole;  NVD neovascularization of the disc; NVE neovascularization elsewhere; AREDS age related eye disease study; ARMD age related macular degeneration; POAG primary open angle glaucoma; EBMD epithelial/anterior basement membrane dystrophy; ACIOL anterior chamber intraocular lens; IOL intraocular lens; PCIOL posterior chamber intraocular lens; Phaco/IOL phacoemulsification with intraocular lens placement; [place] photorefractive keratectomy; LASIK laser assisted in situ keratomileusis; HTN hypertension; DM diabetes mellitus; COPD chronic obstructive pulmonary disease

## 2020-04-04 ENCOUNTER — Ambulatory Visit (INDEPENDENT_AMBULATORY_CARE_PROVIDER_SITE_OTHER): Payer: BC Managed Care – PPO | Admitting: Ophthalmology

## 2020-04-04 ENCOUNTER — Encounter (INDEPENDENT_AMBULATORY_CARE_PROVIDER_SITE_OTHER): Payer: Self-pay | Admitting: Ophthalmology

## 2020-04-04 ENCOUNTER — Other Ambulatory Visit: Payer: Self-pay

## 2020-04-04 DIAGNOSIS — H35033 Hypertensive retinopathy, bilateral: Secondary | ICD-10-CM

## 2020-04-04 DIAGNOSIS — I10 Essential (primary) hypertension: Secondary | ICD-10-CM

## 2020-04-04 DIAGNOSIS — H3552 Pigmentary retinal dystrophy: Secondary | ICD-10-CM

## 2020-04-04 DIAGNOSIS — H353112 Nonexudative age-related macular degeneration, right eye, intermediate dry stage: Secondary | ICD-10-CM

## 2020-04-04 DIAGNOSIS — H3581 Retinal edema: Secondary | ICD-10-CM | POA: Diagnosis not present

## 2020-04-04 DIAGNOSIS — H25813 Combined forms of age-related cataract, bilateral: Secondary | ICD-10-CM

## 2020-04-04 DIAGNOSIS — H353221 Exudative age-related macular degeneration, left eye, with active choroidal neovascularization: Secondary | ICD-10-CM

## 2020-04-04 MED ORDER — AFLIBERCEPT 2MG/0.05ML IZ SOLN FOR KALEIDOSCOPE
2.0000 mg | INTRAVITREAL | Status: AC | PRN
  Administered 2020-04-04: 2 mg via INTRAVITREAL

## 2020-04-07 NOTE — Progress Notes (Signed)
[facility] Note  04/15/2020     CHIEF COMPLAINT Patient presents for Retina Follow Up   HISTORY OF PRESENT ILLNESS: Eric Key is a 64 y.o. male who presents to the clinic today for:   HPI    Retina Follow Up    Patient presents with  Wet AMD.  In left eye.  Severity is moderate.  Duration of 11 days.  I, the attending physician,  performed the HPI with the patient and updated documentation appropriately.          Comments    Patient states vision the same OU.       Last edited by [name], MD on 04/15/2020  3:38 PM. (History)    pt is here for IVE OS   Referring physician: [name], [name], MD [place],   [id number]  HISTORICAL INFORMATION:   Selected notes from the MEDICAL RECORD NUMBER Referred by Dr. [name] for concern of mac heme OS.   CURRENT MEDICATIONS: No current outpatient medications on file. (Ophthalmic Drugs)   No current facility-administered medications for this visit. (Ophthalmic Drugs)   Current Outpatient Medications (Other)  Medication Sig  . HYDROcodone-acetaminophen (NORCO/VICODIN) 5-325 MG tablet Take 1 tablet by mouth every 6 (six) hours as needed for up to 3 days.  . ondansetron (ZOFRAN) 4 MG tablet Take 1 tablet (4 mg total) by mouth every 8 (eight) hours as needed for nausea or vomiting.  . tamsulosin (FLOMAX) 0.4 MG CAPS capsule Take 1 capsule (0.4 mg total) by mouth daily after supper for 7 days.   No current facility-administered medications for this visit. (Other)      REVIEW OF SYSTEMS: ROS    Positive for: Eyes   Negative for: Constitutional, Gastrointestinal, Neurological, Skin, Genitourinary, Musculoskeletal, HENT, Endocrine, Cardiovascular, Respiratory, Psychiatric, Allergic/Imm, Heme/Lymph   Last edited by Roselee Nova D, COT on 04/15/2020  7:47 AM. (History)       ALLERGIES Allergies  Allergen Reactions  . Penicillins Hives    PAST MEDICAL  HISTORY Past Medical History:  Diagnosis Date  . Cataract    Mixed form OU  . History of adenomatous polyps of colon 03/16/2020  . Hypertension    past hx   . Hypertensive retinopathy    OU  . Macular degeneration    Dry OD; Wet OS   Past Surgical History:  Procedure Laterality Date  . ACHILLES TENDON REPAIR  2010  . CHOLECYSTECTOMY    . COLONOSCOPY    . GALLBLADDER SURGERY      FAMILY HISTORY Family History  Problem Relation Age of Onset  . Diabetes Brother   . Heart disease Brother   . Diabetes Maternal Grandfather   . Colon polyps Neg Hx   . Colon cancer Neg Hx   . Esophageal cancer Neg Hx   . Rectal cancer Neg Hx   . Stomach cancer Neg Hx     SOCIAL HISTORY Social History   Tobacco Use  . Smoking status: Former Smoker    Quit date: 02/05/2020    Years since quitting: 0.1  . Smokeless tobacco: Never Used  Vaping Use  . Vaping Use: Never used  Substance Use Topics  . Alcohol use: Yes    Comment: occ  . Drug use: Never         OPHTHALMIC EXAM:  Base Eye Exam    Visual Acuity (Snellen - Linear)      Right Left  Dist cc 20/20 20/40   Correction: Glasses       Tonometry (Tonopen, 7:52 AM)      Right Left   Pressure 08 11       Pupils      Dark Light Shape React APD   Right 3 2 Round Brisk None   Left 3 2 Round Brisk None       Visual Fields (Counting fingers)      Left Right    Full Full       Extraocular Movement      Right Left    Full, Ortho Full, Ortho       Neuro/Psych    Oriented x3: Yes   Mood/Affect: Normal        Slit Lamp and Fundus Exam    Slit Lamp Exam      Right Left   Lids/Lashes Dermatochalasis - upper lid Dermatochalasis - upper lid   Conjunctiva/Sclera White and quiet White and quiet   Cornea 1-2+ PEE, mild Arcus, mild Debris in tear film mild Arcus, mild Debris in tear film, 2-3+ Punctate epithelial erosions   Anterior Chamber Deep and quiet Deep and quiet   Iris Round and dilated Round and dilated    Lens 2+ Nuclear sclerosis, 2+ Cortical cataract 2+ Nuclear sclerosis, 2+ Cortical cataract   Vitreous Vitreous syneresis Vitreous syneresis       Fundus Exam      Right Left   Disc Pink and Sharp, focal PPP ST, Compact Pink and Sharp, Compact   C/D Ratio 0.3 0.3   Macula Flat, Drusen, RPE mottling and clumping, early Atrophy, punctate IRH Abnormal foveal reflex, +CNV with heme improved -- just punctate heme remains; +central edema -- improved, Drusen, RPE mottling and clumping   Vessels Vascular attenuation, AV crossing changes, perivascular pigment clumping peripherally, mild tortuousity Vascular attenuation, Tortuousity, perivascular pigment clumping peripherally   Periphery Attached, peripheral pigmented CR scarring and perivascular bone spicules 360 Attached, peripheral pigmented CR scarring and perivascular bone spicules 360          IMAGING AND PROCEDURES  Imaging and Procedures for _0 @  OCT, Retina - OU - Both Eyes       Right Eye Quality was good. Central Foveal Thickness: 298. Progression has been stable. Findings include normal foveal contour, no IRF, no SRF, retinal drusen , outer retinal atrophy, pigment epithelial detachment (Persistent vitreous opacities; stable low lying PEDs).   Left Eye Quality was borderline. Central Foveal Thickness: 331. Progression has improved. Findings include abnormal foveal contour, intraretinal fluid, subretinal hyper-reflective material, pigment epithelial detachment, outer retinal atrophy, retinal drusen  (mild interval improvement in IRF overlying PED/SRHM temporal macula).   Notes *Images captured and stored on drive  Diagnosis / Impression:  OD: non-exu ARMD  OS: exu ARMD -- mild interval improvement in IRF overlying PED/SRHM temporal macula  Clinical management:  See below  Abbreviations: NFP - Normal foveal profile. CME - cystoid macular edema. PED - pigment epithelial detachment. IRF - intraretinal fluid. SRF - subretinal  fluid. EZ - ellipsoid zone. ERM - epiretinal membrane. ORA - outer retinal atrophy. ORT - outer retinal tubulation. SRHM - subretinal hyper-reflective material        Intravitreal Injection, Pharmacologic Agent - OS - Left Eye       Time Out 04/15/2020. 8:01 AM. Confirmed correct patient, procedure, site, and patient consented.   Anesthesia Topical anesthesia was used. Anesthetic medications included Lidocaine 2%, Proparacaine 0.5%.   Procedure Preparation included 5%  betadine to ocular surface, eyelid speculum. A (32g) needle was used.   Injection:  2 mg aflibercept Alfonse Flavors) SOLN   NDC: [id number], Lot: [id number], Expiration date: 08/13/2020   Route: Intravitreal, Site: Left Eye, Waste: 0.05 mL  Post-op Post injection exam found visual acuity of at least counting fingers. The patient tolerated the procedure well. There were no complications. The patient received written and verbal post procedure care education. Post injection medications were not given.                 ASSESSMENT/PLAN:    ICD-10-CM   1. Exudative age-related macular degeneration of left eye with active choroidal neovascularization (HCC)  H35.3221 Intravitreal Injection, Pharmacologic Agent - OS - Left Eye    aflibercept (EYLEA) SOLN 2 mg  2. Retinal edema  H35.81 OCT, Retina - OU - Both Eyes  3. 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 Essential hypertension  I10   6. Hypertensive retinopathy of both eyes  H35.033   7. Combined forms of age-related cataract of both eyes  H25.813    1,2. 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, OS  - s/p IVA OS #1 (03.04.21), #2 (03.04.21), #3 (04.30.21) -- IVA resistance  - s/p IVE OS #1 (06.04.21), #2 (07.08.21), #3 (08.09.21), #4 (09.13.21), #5 (10.18.21), #6 (11.24.21)  - pt reports subjective improvement in vision OS  - exam shows central CNV with +subretinal heme --  improving  - BCVA 20/40 OS   - OCT shows interval improvement in CNV/PED and overlying IRF/IRHM  - recommend IVE OS #7 today, 12.31.21  - pt wishes to proceed  - RBA of procedure discussed, questions answered  - informed consent obtained and signed  - see procedure note  - IVA consent form signed and scanned, 03.04.21  - IVE consent form signed and scanned, 06.04.21  - [employer] benefits investigation started, 04.30.21 -- approved as of 06.04.21  - f/u in 4-5 wks -- DFE/OCT, possible injection  3. Age related macular degeneration, non-exudative, OD  - The incidence, anatomy, and pathology of dry AMD, risk of progression, and the AREDS and AREDS 2 study including smoking risks discussed with patient.  - Recommend amsler grid monitoring    4. Pigmentary retinopathy OU  - peripheral pigmented CR scarring and perivascular pigment clumping / bone spicules OU  - VA remains fairly good (20/20 OD, 20/40 OS with exudative ARMD OS)  - denies night blindness  - denies family history of vision problems  - discussed findings  - broad differential for pigmentary retinopathy:  Congenital (RP), infectious (TB, lyme dx, syphilis, toxo, bartonella), autoimmune and toxic etiologies  - labs checked (4.2.21):  All essential WNL   Rheumatoid factor   CBC   Comprehensive metabolic panel     RPR   ANA   ESR, CRP   ACE   ANCA   Toxoplasma antibodies IgG / IgM   Quantiferon - TB Gold Plus   HLA, A, B, C (IR)   Fluorescent treponemal ab (fla) - IgG - bld   VDRL Serum   Bartonella antibody, 06.04.21  - [facility] -- drawn on 6.4.21                         Results Positive for AGBL5, ALMS1, OPA1 -- unknown significance             - f/u 4-5 weeks  5,6. Hypertensive retinopathy OU  - discussed importance of tight BP control  - monitor  7. Mixed form age related cataracts OU  - The symptoms of cataract, surgical options, and treatments and risks were discussed with patient.  - discussed  diagnosis and progression  - not yet visually significant  - monitor for now    Ophthalmic Meds Ordered this visit:  Meds ordered this encounter  Medications  . aflibercept (EYLEA) SOLN 2 mg       Return for f/u 4-6 weeks, exu ARMD OS, DFE, OCT.  There are no Patient Instructions on file for this visit.  This document serves as a record of services personally performed by Gardiner Sleeper, MD, PhD. It was created on their behalf by San Jetty. Owens Shark, OA an ophthalmic technician. The creation of this record is the provider's dictation and/or activities during the visit.    Electronically signed by: San Jetty. Owens Shark, [place] 12.31.2021 3:49 PM  Gardiner Sleeper, M.D., Ph.D. Diseases & Surgery of the Retina and Vitreous [facility]  I have reviewed the above documentation for accuracy and completeness, and I agree with the above. Gardiner Sleeper, M.D., Ph.D. 04/15/20 3:49 PM   Abbreviations: M myopia (nearsighted); A astigmatism; H hyperopia (farsighted); P presbyopia; Mrx spectacle prescription;  CTL contact lenses; OD right eye; OS left eye; OU both eyes  XT exotropia; ET esotropia; PEK punctate epithelial keratitis; PEE punctate epithelial erosions; DES dry eye syndrome; MGD meibomian gland dysfunction; ATs artificial tears; PFAT's preservative free artificial tears; [place] nuclear sclerotic cataract; PSC posterior subcapsular cataract; ERM epi-retinal membrane; PVD posterior vitreous detachment; RD retinal detachment; DM diabetes mellitus; DR diabetic retinopathy; NPDR non-proliferative diabetic retinopathy; PDR proliferative diabetic retinopathy; CSME clinically significant macular edema; DME diabetic macular edema; dbh dot blot hemorrhages; CWS cotton wool spot; POAG primary open angle glaucoma; C/D cup-to-disc ratio; HVF humphrey visual field; GVF goldmann visual field; OCT optical coherence tomography; IOP intraocular pressure; BRVO Branch retinal vein occlusion; CRVO  central retinal vein occlusion; CRAO central retinal artery occlusion; BRAO branch retinal artery occlusion; RT retinal tear; SB scleral buckle; PPV pars plana vitrectomy; VH Vitreous hemorrhage; PRP panretinal laser photocoagulation; IVK intravitreal kenalog; VMT vitreomacular traction; MH Macular hole;  NVD neovascularization of the disc; NVE neovascularization elsewhere; AREDS age related eye disease study; ARMD age related macular degeneration; POAG primary open angle glaucoma; EBMD epithelial/anterior basement membrane dystrophy; ACIOL anterior chamber intraocular lens; IOL intraocular lens; PCIOL posterior chamber intraocular lens; Phaco/IOL phacoemulsification with intraocular lens placement; [place] photorefractive keratectomy; LASIK laser assisted in situ keratomileusis; HTN hypertension; DM diabetes mellitus; COPD chronic obstructive pulmonary disease

## 2020-04-12 ENCOUNTER — Emergency Department (HOSPITAL_BASED_OUTPATIENT_CLINIC_OR_DEPARTMENT_OTHER): Payer: BC Managed Care – PPO

## 2020-04-12 ENCOUNTER — Other Ambulatory Visit (HOSPITAL_BASED_OUTPATIENT_CLINIC_OR_DEPARTMENT_OTHER): Payer: Self-pay | Admitting: Emergency Medicine

## 2020-04-12 ENCOUNTER — Emergency Department (HOSPITAL_BASED_OUTPATIENT_CLINIC_OR_DEPARTMENT_OTHER)
Admission: EM | Admit: 2020-04-12 | Discharge: 2020-04-12 | Disposition: A | Discharge status: Home / Self Care | Payer: BC Managed Care – PPO | Attending: Emergency Medicine | Admitting: Emergency Medicine

## 2020-04-12 ENCOUNTER — Encounter (HOSPITAL_BASED_OUTPATIENT_CLINIC_OR_DEPARTMENT_OTHER): Payer: Self-pay

## 2020-04-12 ENCOUNTER — Other Ambulatory Visit: Payer: Self-pay

## 2020-04-12 DIAGNOSIS — Z87891 Personal history of nicotine dependence: Secondary | ICD-10-CM | POA: Diagnosis not present

## 2020-04-12 DIAGNOSIS — Z9049 Acquired absence of other specified parts of digestive tract: Secondary | ICD-10-CM | POA: Insufficient documentation

## 2020-04-12 DIAGNOSIS — N200 Calculus of kidney: Secondary | ICD-10-CM

## 2020-04-12 DIAGNOSIS — I10 Essential (primary) hypertension: Secondary | ICD-10-CM | POA: Insufficient documentation

## 2020-04-12 DIAGNOSIS — R079 Chest pain, unspecified: Secondary | ICD-10-CM | POA: Diagnosis not present

## 2020-04-12 DIAGNOSIS — R109 Unspecified abdominal pain: Secondary | ICD-10-CM | POA: Diagnosis not present

## 2020-04-12 DIAGNOSIS — N132 Hydronephrosis with renal and ureteral calculous obstruction: Secondary | ICD-10-CM | POA: Diagnosis not present

## 2020-04-12 LAB — COMPREHENSIVE METABOLIC PANEL
ALT: 15 U/L (ref 0–44)
AST: 17 U/L (ref 15–41)
Albumin: 4.1 g/dL (ref 3.5–5.0)
Alkaline Phosphatase: 45 U/L (ref 38–126)
Anion gap: 10 (ref 5–15)
BUN: 12 mg/dL (ref 8–23)
CO2: 23 mmol/L (ref 22–32)
Calcium: 8.8 mg/dL — ABNORMAL LOW (ref 8.9–10.3)
Chloride: 102 mmol/L (ref 98–111)
Creatinine, Ser: 1.06 mg/dL (ref 0.61–1.24)
GFR, Estimated: >60 mL/min (ref >60)
Glucose, Bld: 157 mg/dL — ABNORMAL HIGH (ref 70–99)
Potassium: 4 mmol/L (ref 3.5–5.1)
Sodium: 135 mmol/L (ref 135–145)
Total Bilirubin: 0.8 mg/dL (ref 0.3–1.2)
Total Protein: 8.2 g/dL — ABNORMAL HIGH (ref 6.5–8.1)

## 2020-04-12 LAB — CBC WITH DIFFERENTIAL/PLATELET
Abs Immature Granulocytes: 0.02 10*3/uL (ref 0.00–0.07)
Basophils Absolute: 0.1 10*3/uL (ref 0.0–0.1)
Basophils Relative: 1 %
Eosinophils Absolute: 0.3 10*3/uL (ref 0.0–0.5)
Eosinophils Relative: 3 %
HCT: 44.2 % (ref 39.0–52.0)
Hemoglobin: 14.6 g/dL (ref 13.0–17.0)
Immature Granulocytes: 0 %
Lymphocytes Relative: 35 %
Lymphs Abs: 3.5 10*3/uL (ref 0.7–4.0)
MCH: 30.4 pg (ref 26.0–34.0)
MCHC: 33 g/dL (ref 30.0–36.0)
MCV: 92.1 fL (ref 80.0–100.0)
Monocytes Absolute: 0.8 10*3/uL (ref 0.1–1.0)
Monocytes Relative: 8 %
Neutro Abs: 5.4 10*3/uL (ref 1.7–7.7)
Neutrophils Relative %: 53 %
Platelets: 200 10*3/uL (ref 150–400)
RBC: 4.8 MIL/uL (ref 4.22–5.81)
RDW: 12.9 % (ref 11.5–15.5)
WBC: 10 10*3/uL (ref 4.0–10.5)
nRBC: 0 % (ref 0.0–0.2)

## 2020-04-12 LAB — URINALYSIS, ROUTINE W REFLEX MICROSCOPIC
Bilirubin Urine: NEGATIVE
Glucose, UA: NEGATIVE mg/dL
Ketones, ur: NEGATIVE mg/dL
Leukocytes,Ua: NEGATIVE
Nitrite: NEGATIVE
Protein, ur: NEGATIVE mg/dL
Specific Gravity, Urine: 1.005 (ref 1.005–1.030)
pH: 6 (ref 5.0–8.0)

## 2020-04-12 LAB — PROTIME-INR
INR: 1 (ref 0.8–1.2)
Prothrombin Time: 12.5 seconds (ref 11.4–15.2)

## 2020-04-12 LAB — URINALYSIS, MICROSCOPIC (REFLEX)

## 2020-04-12 MED ORDER — HYDROMORPHONE HCL 1 MG/ML IJ SOLN
1.0000 mg | Freq: Once | INTRAMUSCULAR | Status: AC
  Administered 2020-04-12: 1 mg via INTRAVENOUS
  Filled 2020-04-12: qty 1

## 2020-04-12 MED ORDER — HYDROCODONE-ACETAMINOPHEN 5-325 MG PO TABS: 1.0000 | ORAL_TABLET | Freq: Four times a day (QID) | ORAL | 0 refills | Status: DC | PRN

## 2020-04-12 MED ORDER — ONDANSETRON HCL 4 MG PO TABS: 4.0000 mg | ORAL_TABLET | Freq: Three times a day (TID) | ORAL | 0 refills | Status: DC | PRN

## 2020-04-12 MED ORDER — SODIUM CHLORIDE 0.9 % IV BOLUS
500.0000 mL | Freq: Once | INTRAVENOUS | Status: AC
  Administered 2020-04-12: 500 mL via INTRAVENOUS

## 2020-04-12 MED ORDER — TAMSULOSIN HCL 0.4 MG PO CAPS: 0.4000 mg | ORAL_CAPSULE | Freq: Every day | ORAL | 0 refills | Status: DC

## 2020-04-12 MED ORDER — MORPHINE SULFATE (PF) 4 MG/ML IV SOLN
4.0000 mg | Freq: Once | INTRAVENOUS | Status: AC
  Administered 2020-04-12: 4 mg via INTRAVENOUS
  Filled 2020-04-12: qty 1

## 2020-04-12 MED ORDER — IOHEXOL 350 MG/ML SOLN
100.0000 mL | Freq: Once | INTRAVENOUS | Status: AC | PRN
  Administered 2020-04-12: 100 mL via INTRAVENOUS

## 2020-04-12 MED FILL — HYDROCODON-APAP 5-325: 5-325 | 3 days supply | Qty: 12 | Fill #0

## 2020-04-12 MED FILL — TAMSULOSIN HCL 0.4 MG CAP: 0.4 | 7 days supply | Qty: 7 | Fill #0

## 2020-04-12 NOTE — ED Provider Notes (Signed)
[facility] EMERGENCY DEPARTMENT Provider Note   CSN: [id number] Arrival date & time: 04/12/20  [id number]     History Chief Complaint  Patient presents with  . Flank Pain    Eric Key is a 64 y.o. male.  HPI   64 year old male with past medical history of HTN presents the emergency department with concern for left-sided flank pain.  Patient states while he was driving to work he had sudden onset left-sided flank pain, this was severe and sharp in nature.  Is been persistent since it started.  He denies any radiation to his chest, back or leg.  He is never had pain like this before.  Denies any history of kidney stones.  No genitourinary symptoms.  He had a normal bowel movement this morning.  He feels flushed and warm but denies any recent fever or illness.  No chest pain or shortness of breath.  No history of abdominal aneurysm.,  Past Medical History:  Diagnosis Date  . Cataract    Mixed form OU  . History of adenomatous polyps of colon 03/16/2020  . Hypertension    past hx   . Hypertensive retinopathy    OU  . Macular degeneration    Dry OD; Wet OS    Patient Active Problem List   Diagnosis Date Noted  . History of adenomatous polyps of colon 03/16/2020    Past Surgical History:  Procedure Laterality Date  . ACHILLES TENDON REPAIR  2010  . CHOLECYSTECTOMY    . COLONOSCOPY    . GALLBLADDER SURGERY         Family History  Problem Relation Age of Onset  . Diabetes Brother   . Heart disease Brother   . Diabetes Maternal Grandfather   . Colon polyps Neg Hx   . Colon cancer Neg Hx   . Esophageal cancer Neg Hx   . Rectal cancer Neg Hx   . Stomach cancer Neg Hx     Social History   Tobacco Use  . Smoking status: Former Smoker    Quit date: 02/05/2020    Years since quitting: 0.1  . Smokeless tobacco: Never Used  Vaping Use  . Vaping Use: Never used  Substance Use Topics  . Alcohol use: Yes    Comment: occ  . Drug use: Never    Home  Medications Prior to Admission medications   Not on File    Allergies    Penicillins  Review of Systems   Review of Systems  Constitutional: Negative for chills and fever.  HENT: Negative for congestion.   Eyes: Negative for visual disturbance.  Respiratory: Negative for shortness of breath.   Cardiovascular: Negative for chest pain.  Gastrointestinal: Negative for abdominal pain, anal bleeding, diarrhea and vomiting.  Genitourinary: Positive for flank pain. Negative for difficulty urinating, dysuria and hematuria.  Musculoskeletal: Negative for back pain and neck pain.  Skin: Negative for rash.  Neurological: Negative for headaches.    Physical Exam Updated Vital Signs BP (!) 150/89 (BP Location: Right Arm)   Pulse 69   Temp 97.6 F (36.4 C) (Oral)   Resp 20   Ht 6\' 1"  (1.854 m)   Wt 113.4 kg   SpO2 94%   BMI 32.98 kg/m   Physical Exam Vitals and nursing note reviewed.  Constitutional:      Appearance: Normal appearance.  HENT:     Head: Normocephalic.     Mouth/Throat:     Mouth: Mucous membranes are moist.  Cardiovascular:     Rate and Rhythm: Normal rate.  Pulmonary:     Effort: Pulmonary effort is normal. No respiratory distress.  Abdominal:     General: There is no distension.     Palpations: Abdomen is soft.     Tenderness: There is no abdominal tenderness. There is left CVA tenderness. There is no guarding.  Musculoskeletal:     Comments: Lower extremities warm to touch, no discoloration or signs of decreased blood flow  Skin:    General: Skin is warm.  Neurological:     Mental Status: He is alert and oriented to person, place, and time. Mental status is at baseline.  Psychiatric:        Mood and Affect: Mood normal.     ED Results / Procedures / Treatments   Labs (all labs ordered are listed, but only abnormal results are displayed) Labs Reviewed  CBC WITH DIFFERENTIAL/PLATELET  COMPREHENSIVE METABOLIC PANEL  PROTIME-INR  URINALYSIS,  ROUTINE W REFLEX MICROSCOPIC    EKG None  Radiology No results found.  Procedures Procedures (including critical care time)  Medications Ordered in ED Medications  sodium chloride 0.9 % bolus 500 mL (has no administration in time range)  morphine 4 MG/ML injection 4 mg (4 mg Intravenous Given 04/12/20 0759)    ED Course  I have reviewed the triage vital signs and the nursing notes.  Pertinent labs & imaging results that were available during my care of the patient were reviewed by me and considered in my medical decision making (see chart for details).    MDM Rules/Calculators/A&P                          64 year old male presents the emergency department with sudden onset of left flank pain.  Original concern due to his discomfort level was vascular possible dissection.  CTA shows no aortic abnormality, confirms a left distal 1 to 2 mm kidney stone with some mild hydronephrosis.  Blood work is reassuring with no significant leukocytosis, kidney function is normal, urinalysis shows no infection.  After medication patient feels significantly improved.  He will be discharged to follow-up with urology as an outpatient.  Patient will be discharged and treated as an outpatient.  Discharge plan and strict return to ED precautions discussed, patient verbalizes understanding and agreement.  Final Clinical Impression(s) / ED Diagnoses Final diagnoses:  None    Rx / DC Orders ED Discharge Orders    None       Rozelle Logan, DO 04/12/20 1201

## 2020-04-12 NOTE — ED Notes (Signed)
Patient given soda and water and encouraged again to provide urine sample

## 2020-04-12 NOTE — ED Notes (Signed)
Patient's O2 sats dropped to 89% on RA, placed on [facility] , encouraged to take deep breaths, MD notified.

## 2020-04-12 NOTE — ED Triage Notes (Signed)
Pt reports L sided flank pain that hit suddenly around 0615 this morning on his way to work after eating a sausage biscuit. Pt states he feels hot but denies n/v.

## 2020-04-12 NOTE — Discharge Instructions (Addendum)
You have been seen and discharged from the emergency department.  Follow-up with your primary provider and urology for reevaluation. Take new prescriptions and home medications as prescribed. If you have any worsening symptoms, severe pain, high fevers or further concerns for health please return to an emergency department for further evaluation.

## 2020-04-12 NOTE — ED Notes (Signed)
Patient transported to CT 

## 2020-04-13 ENCOUNTER — Encounter: Payer: Self-pay | Admitting: *Deleted

## 2020-04-15 ENCOUNTER — Ambulatory Visit (INDEPENDENT_AMBULATORY_CARE_PROVIDER_SITE_OTHER): Payer: BC Managed Care – PPO | Admitting: Ophthalmology

## 2020-04-15 ENCOUNTER — Other Ambulatory Visit: Payer: Self-pay

## 2020-04-15 ENCOUNTER — Encounter (INDEPENDENT_AMBULATORY_CARE_PROVIDER_SITE_OTHER): Payer: Self-pay | Admitting: Ophthalmology

## 2020-04-15 DIAGNOSIS — H353221 Exudative age-related macular degeneration, left eye, with active choroidal neovascularization: Secondary | ICD-10-CM | POA: Diagnosis not present

## 2020-04-15 DIAGNOSIS — H25813 Combined forms of age-related cataract, bilateral: Secondary | ICD-10-CM

## 2020-04-15 DIAGNOSIS — I10 Essential (primary) hypertension: Secondary | ICD-10-CM

## 2020-04-15 DIAGNOSIS — H353112 Nonexudative age-related macular degeneration, right eye, intermediate dry stage: Secondary | ICD-10-CM

## 2020-04-15 DIAGNOSIS — H3552 Pigmentary retinal dystrophy: Secondary | ICD-10-CM | POA: Diagnosis not present

## 2020-04-15 DIAGNOSIS — H3581 Retinal edema: Secondary | ICD-10-CM | POA: Diagnosis not present

## 2020-04-15 DIAGNOSIS — H35033 Hypertensive retinopathy, bilateral: Secondary | ICD-10-CM

## 2020-04-15 MED ORDER — AFLIBERCEPT 2MG/0.05ML IZ SOLN FOR KALEIDOSCOPE
2.0000 mg | INTRAVITREAL | Status: AC | PRN
  Administered 2020-04-15: 2 mg via INTRAVITREAL

## 2020-04-18 ENCOUNTER — Telehealth: Payer: Self-pay | Admitting: Family Medicine

## 2020-04-18 NOTE — Telephone Encounter (Signed)
  Prescription Request  04/18/2020  What is the name of the medication or equipment? hydrocodone  Have you contacted your pharmacy to request a refill? (if applicable) yes  Which pharmacy would you like this sent to? [facility] in [place]  Pt was told that this medicine may not be refilled but he wanted a message put in    Patient notified that their request is being sent to the clinical staff for review and that they should receive a response within 2 business days.

## 2020-04-18 NOTE — Telephone Encounter (Signed)
Pt is aware that this must be filled in office only

## 2020-04-21 ENCOUNTER — Encounter: Payer: Self-pay | Admitting: Family Medicine

## 2020-04-21 ENCOUNTER — Ambulatory Visit (INDEPENDENT_AMBULATORY_CARE_PROVIDER_SITE_OTHER): Payer: BC Managed Care – PPO | Admitting: Family Medicine

## 2020-04-21 ENCOUNTER — Other Ambulatory Visit: Payer: Self-pay

## 2020-04-21 VITALS — BP 135/78 | HR 108 | Ht 73.0 in | Wt 254.0 lb

## 2020-04-21 DIAGNOSIS — Z23 Encounter for immunization: Secondary | ICD-10-CM

## 2020-04-21 DIAGNOSIS — N200 Calculus of kidney: Secondary | ICD-10-CM

## 2020-04-21 LAB — URINALYSIS, COMPLETE
Bilirubin, UA: NEGATIVE
Glucose, UA: NEGATIVE
Ketones, UA: NEGATIVE
Leukocytes,UA: NEGATIVE
Nitrite, UA: NEGATIVE
Protein,UA: NEGATIVE
Specific Gravity, UA: 1.02 (ref 1.005–1.030)
Urobilinogen, Ur: 0.2 mg/dL (ref 0.2–1.0)
pH, UA: 6.5 (ref 5.0–7.5)

## 2020-04-21 LAB — MICROSCOPIC EXAMINATION
Epithelial Cells (non renal): NONE SEEN /hpf (ref 0–10)
WBC, UA: NONE SEEN /hpf (ref 0–5)

## 2020-04-21 MED ORDER — TAMSULOSIN HCL 0.4 MG PO CAPS: 0.4000 mg | ORAL_CAPSULE | Freq: Every day | ORAL | 3 refills | Status: DC

## 2020-04-21 MED ORDER — HYDROCODONE-ACETAMINOPHEN 5-325 MG PO TABS: 1.0000 | ORAL_TABLET | Freq: Four times a day (QID) | ORAL | 0 refills | Status: DC | PRN

## 2020-04-21 NOTE — Progress Notes (Signed)
Vitals: BP 135/78 HR: 98 RR 16  HPI: Pt is a 65 y male with complaint of left flank pain since Tuesday of last week. Pt states he was seen in the ER for this issue and was prescribed hydrocodone for pan and tamsulosin. Pt states that he continued hydrocodone until Sunday when he ran out and continues to experience 8/10 pain that he describes as radiating from his flank to his groin and then back up. He has taken 4500mg  of Tylenol daily as well as an occasional ibuprofen with some alleviation but not much. Pt denies chest pain, difficulty breathing, flu-like symptoms, abdominal discomfort, N/V/D, or dysuria. Pt states that he attempts to urinate 3-4 times a day but only gets trickles and his urine is brown.   PMH: cataracts, HTN PSH: cholecystectomy  ROS:  Constitutional: denies fever or fatigue HEENT: denies sore throat or congestion Cardiac: denies chest pain Pulm: denies difficulty breathing or SOB GI: denies N/V/D, abdominal discomfort GU: denies painful urination or urgency. Endorses left flank pain, difficulty urinating, brown coloration, and feeling of fullness. MSK: denies extremity swelling  PE: General: well appearing and in no acute distress, AO x 4 HEENT: eyes PERRL, white sclera Cardiac: regular rate and rhytm, no m/r/g Pulm: CTA bilat GI: soft, nontender, non distended GU: mild left flank tenderness MSK: no peripheral edema  Labs:  UA Color: yellow Clarity: clear Glu: neg Bil: neg Ket: neg SG: 1.020 Blood: trace-intact pH: 6.5 Pro: neg Uro: 0.3 eu/dL Nit: neg Leu: neg  Assessment:  Nephrolithiasis  ER identified a 1-8mm solid mass on ultrasound with hydronephrosis. Unilateral flank pain, difficulty passing urine and trace blood indicate the stone likely has yet to pass.   Plan:  Refill HYDROcodone-acetaminophen 5-325 MG tablet x 21 days Take 1 tablet by mouth every 6 (six) hours as needed for moderate pain   Refill tamsulosin 0.4mg  po qd x21 days  Pt  advised to continue to drink a lot of fluids and if the pain persist over the next 2-3 weeks then he should call for possible referral to urology.  Problem List Items Addressed This Visit   None   Visit Diagnoses    Renal stone    -  Primary   Relevant Medications   HYDROcodone-acetaminophen (NORCO/VICODIN) 5-325 MG tablet   tamsulosin (FLOMAX) 0.4 MG CAPS capsule   Other Relevant Orders   Urinalysis, Complete (Completed)   Need for Tdap vaccination       Relevant Orders   Tdap vaccine greater than or equal to 7yo IM (Completed)      Patient seen and examined with 3m, PA student.  Agree with assessment and plan above.  Will treat symptomatically for now and see if we can improve and increase hydration and get the kidney stone to pass, patient will start filtering for the stone, if pain worsens or does not improve then we will go ahead and do a urology referral in the future.  Damaris Hippo, MD [facility] 04/21/2020, 3:59 PM

## 2020-05-23 NOTE — Progress Notes (Signed)
[facility] Note  05/27/2020     CHIEF COMPLAINT Patient presents for Retina Follow Up   HISTORY OF PRESENT ILLNESS: Eric Key is a 65 y.o. male who presents to the clinic today for:   HPI    Retina Follow Up    Patient presents with  Wet AMD.  In left eye.  Severity is moderate.  Duration of 6 weeks.  Since onset it is stable.  I, the attending physician,  performed the HPI with the patient and updated documentation appropriately.          Comments    Patient states vision the same OU. Got new glasses RX. Vision seems good with new RX.       Last edited by [name], MD on 05/27/2020  7:54 AM. (History)    pt states he has gotten new glasses since his last visit here   Referring physician: [name], [name], MD [place],  [place] [address]  HISTORICAL INFORMATION:   Selected notes from the MEDICAL RECORD NUMBER Referred by Dr. [name] for concern of mac heme OS.   CURRENT MEDICATIONS: No current outpatient medications on file. (Ophthalmic Drugs)   No current facility-administered medications for this visit. (Ophthalmic Drugs)   Current Outpatient Medications (Other)  Medication Sig  . HYDROcodone-acetaminophen (NORCO/VICODIN) 5-325 MG tablet Take 1 tablet by mouth every 6 (six) hours as needed for moderate pain.  Marland Kitchen ondansetron (ZOFRAN) 4 MG tablet Take 1 tablet (4 mg total) by mouth every 8 (eight) hours as needed for nausea or vomiting.  . tamsulosin (FLOMAX) 0.4 MG CAPS capsule Take 1 capsule (0.4 mg total) by mouth daily.   No current facility-administered medications for this visit. (Other)      REVIEW OF SYSTEMS: ROS    Positive for: Eyes   Negative for: Constitutional, Gastrointestinal, Neurological, Skin, Genitourinary, Musculoskeletal, HENT, Endocrine, Cardiovascular, Respiratory, Psychiatric, Allergic/Imm, Heme/Lymph   Last edited by Roselee Nova D, COT on 05/27/2020  7:40 AM. (History)        ALLERGIES Allergies  Allergen Reactions  . Penicillins Hives    PAST MEDICAL HISTORY Past Medical History:  Diagnosis Date  . Cataract    Mixed form OU  . History of adenomatous polyps of colon 03/16/2020  . Hypertension    past hx   . Hypertensive retinopathy    OU  . Macular degeneration    Dry OD; Wet OS   Past Surgical History:  Procedure Laterality Date  . ACHILLES TENDON REPAIR  2010  . CHOLECYSTECTOMY    . COLONOSCOPY    . GALLBLADDER SURGERY      FAMILY HISTORY Family History  Problem Relation Age of Onset  . Diabetes Brother   . Heart disease Brother   . Diabetes Maternal Grandfather   . Colon polyps Neg Hx   . Colon cancer Neg Hx   . Esophageal cancer Neg Hx   . Rectal cancer Neg Hx   . Stomach cancer Neg Hx     SOCIAL HISTORY Social History   Tobacco Use  . Smoking status: Former Smoker    Quit date: 02/05/2020    Years since quitting: 0.3  . Smokeless tobacco: Never Used  Vaping Use  . Vaping Use: Never used  Substance Use Topics  . Alcohol use: Yes    Comment: occ  . Drug use: Never         OPHTHALMIC EXAM:  Base Eye Exam  Visual Acuity (Snellen - Linear)      Right Left   Dist cc 20/20 -1 20/25 -2   Dist ph cc  NI   Correction: Glasses       Tonometry (Tonopen, 7:46 AM)      Right Left   Pressure 12 11       Pupils      Dark Light Shape React APD   Right 3 2 Round Brisk None   Left 3 2 Round Brisk None       Visual Fields (Counting fingers)      Left Right    Full Full       Extraocular Movement      Right Left    Full, Ortho Full, Ortho       Neuro/Psych    Oriented x3: Yes   Mood/Affect: Normal       Dilation    Both eyes: 1.0% Mydriacyl, 2.5% Phenylephrine @ 7:46 AM        Slit Lamp and Fundus Exam    Slit Lamp Exam      Right Left   Lids/Lashes Dermatochalasis - upper lid Dermatochalasis - upper lid   Conjunctiva/Sclera White and quiet White and quiet   Cornea 1-2+ PEE, mild Arcus, mild  Debris in tear film mild Arcus, mild Debris in tear film, 3+ Punctate epithelial erosions, decreased TBUT   Anterior Chamber deep, clear, narrow angles deep, clear, narrow angles   Iris Round and moderately dilated, mild anterior bowing Round and moderately dilated, mild anterior bowing   Lens 2+ Nuclear sclerosis, 2+ Cortical cataract 2+ Nuclear sclerosis, 2+ Cortical cataract   Vitreous Vitreous syneresis Vitreous syneresis       Fundus Exam      Right Left   Disc Pink and Sharp, focal PPP ST, Compact Pink and Sharp, Compact   C/D Ratio 0.3 0.3   Macula Flat, Drusen, RPE mottling and clumping, early Atrophy, punctate IRH Abnormal foveal reflex, +CNV with heme improved -- just punctate heme remains; +central edema -- improved, Drusen, RPE mottling and clumping   Vessels attenuated, mild tortuousity attenuated, mild tortuousity   Periphery Attached, peripheral pigmented CR scarring and perivascular bone spicules 360 Attached, peripheral pigmented CR scarring and perivascular bone spicules 360        Refraction    Wearing Rx      Sphere Cylinder Axis   Right [place] +1.25 177   Left [place] +0.75 170          IMAGING AND PROCEDURES  Imaging and Procedures for _0 @  OCT, Retina - OU - Both Eyes       Right Eye Quality was good. Central Foveal Thickness: 299. Progression has been stable. Findings include normal foveal contour, no IRF, no SRF, retinal drusen , outer retinal atrophy, pigment epithelial detachment (Persistent vitreous opacities; stable low lying PEDs).   Left Eye Quality was good. Central Foveal Thickness: 331. Progression has improved. Findings include abnormal foveal contour, intraretinal fluid, subretinal hyper-reflective material, pigment epithelial detachment, outer retinal atrophy, retinal drusen  (interval improvement in IRF overlying PED/SRHM temporal macula).   Notes *Images captured and stored on drive  Diagnosis / Impression:  OD: non-exu ARMD  OS:  exu ARMD -- interval improvement in IRF overlying PED/SRHM temporal macula  Clinical management:  See below  Abbreviations: NFP - Normal foveal profile. CME - cystoid macular edema. PED - pigment epithelial detachment. IRF - intraretinal fluid. SRF - subretinal fluid. EZ - ellipsoid zone. ERM -  epiretinal membrane. ORA - outer retinal atrophy. ORT - outer retinal tubulation. SRHM - subretinal hyper-reflective material        Intravitreal Injection, Pharmacologic Agent - OS - Left Eye       Time Out 05/27/2020. 7:58 AM. Confirmed correct patient, procedure, site, and patient consented.   Anesthesia Topical anesthesia was used. Anesthetic medications included Lidocaine 2%, Proparacaine 0.5%.   Procedure Preparation included 5% betadine to ocular surface, eyelid speculum. A (32g) needle was used.   Injection:  2 mg aflibercept Alfonse Flavors) SOLN   NDC: [id number], Lot: [id number], Expiration date: 09/13/2020   Route: Intravitreal, Site: Left Eye, Waste: 0.05 mL  Post-op Post injection exam found visual acuity of at least counting fingers. The patient tolerated the procedure well. There were no complications. The patient received written and verbal post procedure care education. Post injection medications were not given.                 ASSESSMENT/PLAN:    ICD-10-CM   1. Exudative age-related macular degeneration of left eye with active choroidal neovascularization (HCC)  H35.3221 Intravitreal Injection, Pharmacologic Agent - OS - Left Eye    aflibercept (EYLEA) SOLN 2 mg  2. Retinal edema  H35.81 OCT, Retina - OU - Both Eyes  3. 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 Essential hypertension  I10   6. Hypertensive retinopathy of both eyes  H35.033   7. Combined forms of age-related cataract of both eyes  H25.813    1,2. 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, OS  - s/p IVA OS #1  (03.04.21), #2 (03.04.21), #3 (04.30.21) -- IVA resistance  - s/p IVE OS #1 (06.04.21), #2 (07.08.21), #3 (08.09.21), #4 (09.13.21), #5 (10.18.21), #6 (11.24.21), #7 (12.31.21)  - pt reports subjective improvement in vision OS  - exam shows central CNV with +subretinal heme -- improving  - BCVA 20/25 OS (improved)  - OCT shows interval improvement in CNV/PED and overlying IRF/IRHM  - recommend IVE OS #8 today, 02.11.22  - pt wishes to proceed  - RBA of procedure discussed, questions answered  - informed consent obtained and signed  - see procedure note  - IVA consent form signed and scanned, 03.04.21  - IVE consent form signed and scanned, 06.04.21  - [employer] benefits investigation started, 04.30.21 -- approved as of 06.04.21  - f/u in 6 wks -- DFE/OCT, possible injection  3. Age related macular degeneration, non-exudative, OD  - The incidence, anatomy, and pathology of dry AMD, risk of progression, and the AREDS and AREDS 2 study including smoking risks discussed with patient.  - Recommend amsler grid monitoring    4. Pigmentary retinopathy OU  - peripheral pigmented CR scarring and perivascular pigment clumping / bone spicules OU  - VA remains fairly good (20/20 OD, 20/40 OS with exudative ARMD OS)  - denies night blindness  - denies family history of vision problems  - discussed findings  - broad differential for pigmentary retinopathy:  Congenital (RP), infectious (TB, lyme dx, syphilis, toxo, bartonella), autoimmune and toxic etiologies  - labs checked (4.2.21):  All essential WNL   Rheumatoid factor   CBC   Comprehensive metabolic panel     RPR   ANA   ESR, CRP   ACE   ANCA   Toxoplasma antibodies IgG / IgM   Quantiferon - TB Gold Plus   HLA, A, B, C (IR)   Fluorescent treponemal  ab (fla) - IgG - bld   VDRL Serum   Bartonella antibody, 06.04.21  - [facility] -- drawn on 6.4.21                         Results Positive for AGBL5, ALMS1, OPA1 -- unknown  significance             - f/u 4-5 weeks  5,6. Hypertensive retinopathy OU  - discussed importance of tight BP control  - monitor  7. Mixed form age related cataracts OU  - The symptoms of cataract, surgical options, and treatments and risks were discussed with patient.  - discussed diagnosis and progression  - not yet visually significant  - monitor for now    Ophthalmic Meds Ordered this visit:  Meds ordered this encounter  Medications  . aflibercept (EYLEA) SOLN 2 mg       Return in about 6 weeks (around 07/08/2020) for f/u exu ARMD OS, DFE, OCT.  There are no Patient Instructions on file for this visit.  This document serves as a record of services personally performed by Gardiner Sleeper, MD, PhD. It was created on their behalf by San Jetty. Owens Shark, OA an ophthalmic technician. The creation of this record is the provider's dictation and/or activities during the visit.    Electronically signed by: San Jetty. Owens Shark, [place] 02.07.2022 9:21 AM  Gardiner Sleeper, M.D., Ph.D. Diseases & Surgery of the Retina and Vitreous [facility]  I have reviewed the above documentation for accuracy and completeness, and I agree with the above. Gardiner Sleeper, M.D., Ph.D. 05/27/20 9:21 AM   Abbreviations: M myopia (nearsighted); A astigmatism; H hyperopia (farsighted); P presbyopia; Mrx spectacle prescription;  CTL contact lenses; OD right eye; OS left eye; OU both eyes  XT exotropia; ET esotropia; PEK punctate epithelial keratitis; PEE punctate epithelial erosions; DES dry eye syndrome; MGD meibomian gland dysfunction; ATs artificial tears; PFAT's preservative free artificial tears; [place] nuclear sclerotic cataract; PSC posterior subcapsular cataract; ERM epi-retinal membrane; PVD posterior vitreous detachment; RD retinal detachment; DM diabetes mellitus; DR diabetic retinopathy; NPDR non-proliferative diabetic retinopathy; PDR proliferative diabetic retinopathy; CSME clinically  significant macular edema; DME diabetic macular edema; dbh dot blot hemorrhages; CWS cotton wool spot; POAG primary open angle glaucoma; C/D cup-to-disc ratio; HVF humphrey visual field; GVF goldmann visual field; OCT optical coherence tomography; IOP intraocular pressure; BRVO Branch retinal vein occlusion; CRVO central retinal vein occlusion; CRAO central retinal artery occlusion; BRAO branch retinal artery occlusion; RT retinal tear; SB scleral buckle; PPV pars plana vitrectomy; VH Vitreous hemorrhage; PRP panretinal laser photocoagulation; IVK intravitreal kenalog; VMT vitreomacular traction; MH Macular hole;  NVD neovascularization of the disc; NVE neovascularization elsewhere; AREDS age related eye disease study; ARMD age related macular degeneration; POAG primary open angle glaucoma; EBMD epithelial/anterior basement membrane dystrophy; ACIOL anterior chamber intraocular lens; IOL intraocular lens; PCIOL posterior chamber intraocular lens; Phaco/IOL phacoemulsification with intraocular lens placement; [place] photorefractive keratectomy; LASIK laser assisted in situ keratomileusis; HTN hypertension; DM diabetes mellitus; COPD chronic obstructive pulmonary disease

## 2020-05-27 ENCOUNTER — Other Ambulatory Visit: Payer: Self-pay

## 2020-05-27 ENCOUNTER — Ambulatory Visit (INDEPENDENT_AMBULATORY_CARE_PROVIDER_SITE_OTHER): Payer: BC Managed Care – PPO | Admitting: Ophthalmology

## 2020-05-27 ENCOUNTER — Encounter (INDEPENDENT_AMBULATORY_CARE_PROVIDER_SITE_OTHER): Payer: Self-pay | Admitting: Ophthalmology

## 2020-05-27 DIAGNOSIS — H25813 Combined forms of age-related cataract, bilateral: Secondary | ICD-10-CM

## 2020-05-27 DIAGNOSIS — H3552 Pigmentary retinal dystrophy: Secondary | ICD-10-CM

## 2020-05-27 DIAGNOSIS — I10 Essential (primary) hypertension: Secondary | ICD-10-CM

## 2020-05-27 DIAGNOSIS — H35033 Hypertensive retinopathy, bilateral: Secondary | ICD-10-CM

## 2020-05-27 DIAGNOSIS — H353112 Nonexudative age-related macular degeneration, right eye, intermediate dry stage: Secondary | ICD-10-CM | POA: Diagnosis not present

## 2020-05-27 DIAGNOSIS — H3581 Retinal edema: Secondary | ICD-10-CM | POA: Diagnosis not present

## 2020-05-27 DIAGNOSIS — H353221 Exudative age-related macular degeneration, left eye, with active choroidal neovascularization: Secondary | ICD-10-CM | POA: Diagnosis not present

## 2020-05-27 MED ORDER — AFLIBERCEPT 2MG/0.05ML IZ SOLN FOR KALEIDOSCOPE
2.0000 mg | INTRAVITREAL | Status: AC | PRN
  Administered 2020-05-27: 2 mg via INTRAVITREAL

## 2020-07-05 NOTE — Progress Notes (Signed)
[facility] Note  07/08/2020     CHIEF COMPLAINT Patient presents for Retina Follow Up   HISTORY OF PRESENT ILLNESS: Eric Key is a 65 y.o. male who presents to the clinic today for:   HPI    Retina Follow Up    Patient presents with  Wet AMD.  In left eye.  Severity is moderate.  Duration of 6 weeks.  Since onset it is stable.  I, the attending physician,  performed the HPI with the patient and updated documentation appropriately.          Comments    Patient states vision the same OU.       Last edited by [name], MD on 07/08/2020  8:13 AM. (History)    pt   Referring physician: [name], [name], MD [place],  [place] [address]  HISTORICAL INFORMATION:   Selected notes from the MEDICAL RECORD NUMBER Referred by Dr. [name] for concern of mac heme OS.   CURRENT MEDICATIONS: No current outpatient medications on file. (Ophthalmic Drugs)   No current facility-administered medications for this visit. (Ophthalmic Drugs)   Current Outpatient Medications (Other)  Medication Sig  . HYDROcodone-acetaminophen (NORCO/VICODIN) 5-325 MG tablet Take 1 tablet by mouth every 6 (six) hours as needed for moderate pain.  Marland Kitchen ondansetron (ZOFRAN) 4 MG tablet Take 1 tablet (4 mg total) by mouth every 8 (eight) hours as needed for nausea or vomiting.  . tamsulosin (FLOMAX) 0.4 MG CAPS capsule Take 1 capsule (0.4 mg total) by mouth daily.   No current facility-administered medications for this visit. (Other)      REVIEW OF SYSTEMS: ROS    Positive for: Eyes   Negative for: Constitutional, Gastrointestinal, Neurological, Skin, Genitourinary, Musculoskeletal, HENT, Endocrine, Cardiovascular, Respiratory, Psychiatric, Allergic/Imm, Heme/Lymph   Last edited by Roselee Nova D, COT on 07/08/2020  7:57 AM. (History)       ALLERGIES Allergies  Allergen Reactions  . Penicillins Hives    PAST MEDICAL HISTORY Past Medical  History:  Diagnosis Date  . Cataract    Mixed form OU  . History of adenomatous polyps of colon 03/16/2020  . Hypertension    past hx   . Hypertensive retinopathy    OU  . Macular degeneration    Dry OD; Wet OS   Past Surgical History:  Procedure Laterality Date  . ACHILLES TENDON REPAIR  2010  . CHOLECYSTECTOMY    . COLONOSCOPY    . GALLBLADDER SURGERY      FAMILY HISTORY Family History  Problem Relation Age of Onset  . Diabetes Brother   . Heart disease Brother   . Diabetes Maternal Grandfather   . Colon polyps Neg Hx   . Colon cancer Neg Hx   . Esophageal cancer Neg Hx   . Rectal cancer Neg Hx   . Stomach cancer Neg Hx     SOCIAL HISTORY Social History   Tobacco Use  . Smoking status: Former Smoker    Quit date: 02/05/2020    Years since quitting: 0.4  . Smokeless tobacco: Never Used  Vaping Use  . Vaping Use: Never used  Substance Use Topics  . Alcohol use: Yes    Comment: occ  . Drug use: Never         OPHTHALMIC EXAM:  Base Eye Exam    Visual Acuity (Snellen - Linear)      Right Left   Dist cc 20/20 20/30  Dist ph cc  20/25       Tonometry (Tonopen, 7:59 AM)      Right Left   Pressure 16 13       Pupils      Dark Light Shape React APD   Right 3 2 Round Brisk None   Left 3 2 Round Brisk None       Visual Fields (Counting fingers)      Left Right    Full Full       Extraocular Movement      Right Left    Full, Ortho Full, Ortho       Neuro/Psych    Oriented x3: Yes   Mood/Affect: Normal       Dilation    Both eyes: 1.0% Mydriacyl, 2.5% Phenylephrine @ 7:59 AM        Slit Lamp and Fundus Exam    Slit Lamp Exam      Right Left   Lids/Lashes Dermatochalasis - upper lid Dermatochalasis - upper lid   Conjunctiva/Sclera White and quiet White and quiet   Cornea 1-2+ PEE, mild Arcus, mild Debris in tear film mild Arcus, mild Debris in tear film, 3+ Punctate epithelial erosions, decreased TBUT   Anterior Chamber deep, clear,  narrow angles deep, clear, narrow angles   Iris Round and moderately dilated, mild anterior bowing Round and moderately dilated, mild anterior bowing   Lens 2+ Nuclear sclerosis, 2+ Cortical cataract 2+ Nuclear sclerosis, 2+ Cortical cataract   Vitreous Vitreous syneresis Vitreous syneresis       Fundus Exam      Right Left   Disc Pink and Sharp, focal PPP ST, Compact Pink and Sharp, Compact   C/D Ratio 0.3 0.3   Macula Flat, Drusen, RPE mottling and clumping, early Atrophy, punctate IRH Abnormal foveal reflex, +CNV with heme improved -- just punctate heme remains; +central edema -- improved, Drusen, RPE mottling and clumping   Vessels attenuated, mild tortuousity attenuated, mild tortuousity   Periphery Attached, peripheral pigmented CR scarring and perivascular bone spicules 360 Attached, peripheral pigmented CR scarring and perivascular bone spicules 360        Refraction    Wearing Rx      Sphere Cylinder Axis   Right [place] +1.25 177   Left [place] +0.75 170          IMAGING AND PROCEDURES  Imaging and Procedures for _0 @  OCT, Retina - OU - Both Eyes       Right Eye Quality was good. Central Foveal Thickness: 302. Progression has been stable. Findings include normal foveal contour, no IRF, no SRF, retinal drusen , outer retinal atrophy, pigment epithelial detachment (Persistent vitreous opacities--slightly improved; stable low lying PEDs).   Left Eye Quality was good. Central Foveal Thickness: 336. Progression has been stable. Findings include abnormal foveal contour, intraretinal fluid, subretinal hyper-reflective material, pigment epithelial detachment, outer retinal atrophy, retinal drusen  (Persistent cystic chagnes / IRF overlying PED/SRHM temporal macula -- slightly increased).   Notes *Images captured and stored on drive  Diagnosis / Impression:  OD: non-exu ARMD  OS: exu ARMD -- Persistent cystic chagnes / IRF overlying PED/SRHM temporal macula -- slightly  increased  Clinical management:  See below  Abbreviations: NFP - Normal foveal profile. CME - cystoid macular edema. PED - pigment epithelial detachment. IRF - intraretinal fluid. SRF - subretinal fluid. EZ - ellipsoid zone. ERM - epiretinal membrane. ORA - outer retinal atrophy. ORT - outer retinal tubulation. SRHM - subretinal  hyper-reflective material        Intravitreal Injection, Pharmacologic Agent - OS - Left Eye       Time Out 07/08/2020. 8:23 AM. Confirmed correct patient, procedure, site, and patient consented.   Anesthesia Topical anesthesia was used. Anesthetic medications included Lidocaine 2%, Proparacaine 0.5%.   Procedure Preparation included 5% betadine to ocular surface, eyelid speculum. A (32g) needle was used.   Injection:  2 mg aflibercept Alfonse Flavors) SOLN   NDC: [id number], Lot: [id number], Expiration date: 01/13/2021   Route: Intravitreal, Site: Left Eye, Waste: 0.05 mL  Post-op Post injection exam found visual acuity of at least counting fingers. The patient tolerated the procedure well. There were no complications. The patient received written and verbal post procedure care education. Post injection medications were not given.                 ASSESSMENT/PLAN:    ICD-10-CM   1. Exudative age-related macular degeneration of left eye with active choroidal neovascularization (HCC)  H35.3221 Intravitreal Injection, Pharmacologic Agent - OS - Left Eye    aflibercept (EYLEA) SOLN 2 mg  2. Retinal edema  H35.81 OCT, Retina - OU - Both Eyes  3. 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 Essential hypertension  I10   6. Hypertensive retinopathy of both eyes  H35.033   7. Combined forms of age-related cataract of both eyes  H25.813    1,2. 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, OS  - s/p IVA OS #1 (03.04.21), #2 (03.04.21), #3 (04.30.21) -- IVA resistance  - s/p  IVE OS #1 (06.04.21), #2 (07.08.21), #3 (08.09.21), #4 (09.13.21), #5 (10.18.21), #6 (11.24.21), #7 (12.31.21), #8 (02.11.22)  - pt reports stable vision OS  - exam shows central CNV with +subretinal heme -- improving  - BCVA 20/25 OS (stably improved)  - OCT shows persistent cystic chagnes / IRF overlying PED/SRHM temporal macula -- slightly increased -- at 6 wk interval  - recommend IVE OS #9 today, 03.25.22 w/ interval back to 5 wks  - pt wishes to proceed  - RBA of procedure discussed, questions answered  - informed consent obtained and signed  - see procedure note  - IVA consent form signed and scanned, 03.04.21  - IVE consent form signed and scanned, 06.04.21  - Eylea4U benefits investigation started, 04.30.21 -- approved as of 06.04.21  - f/u in 5 wks -- DFE/OCT, possible injection  3. Age related macular degeneration, non-exudative, OD  - The incidence, anatomy, and pathology of dry AMD, risk of progression, and the AREDS and AREDS 2 study including smoking risks discussed with patient.  - Recommend amsler grid monitoring    4. Pigmentary retinopathy OU  - peripheral pigmented CR scarring and perivascular pigment clumping / bone spicules OU  - VA remains fairly good (20/20 OD, 20/40 OS with exudative ARMD OS)  - denies night blindness  - denies family history of vision problems  - discussed findings  - broad differential for pigmentary retinopathy:  Congenital (RP), infectious (TB, lyme dx, syphilis, toxo, bartonella), autoimmune and toxic etiologies  - labs checked (4.2.21):  All essential WNL   Rheumatoid factor   CBC   Comprehensive metabolic panel     RPR   ANA   ESR, CRP   ACE   ANCA   Toxoplasma antibodies IgG / IgM   Quantiferon - TB Gold Plus   HLA, A, B, C (IR)  Fluorescent treponemal ab (fla) - IgG - bld   VDRL Serum   Bartonella antibody, 06.04.21  - [facility] -- drawn on 6.4.21                         Results Positive for AGBL5, ALMS1, OPA1  -- unknown significance             - f/u 4-5 weeks  5,6. Hypertensive retinopathy OU  - discussed importance of tight BP control  - monitor  7. Mixed form age related cataracts OU  - The symptoms of cataract, surgical options, and treatments and risks were discussed with patient.  - discussed diagnosis and progression  - not yet visually significant  - monitor for now    Ophthalmic Meds Ordered this visit:  Meds ordered this encounter  Medications  . aflibercept (EYLEA) SOLN 2 mg       Return in about 5 weeks (around 08/12/2020) for ex ARMD OS - Dilated Exam, OCT, Possible Injxn.  There are no Patient Instructions on file for this visit.  This document serves as a record of services personally performed by Gardiner Sleeper, MD, PhD. It was created on their behalf by San Jetty. Owens Shark, OA an ophthalmic technician. The creation of this record is the provider's dictation and/or activities during the visit.    Electronically signed by: San Jetty. Owens Shark, [place] 03.22.2022 8:31 AM  Gardiner Sleeper, M.D., Ph.D. Diseases & Surgery of the Retina and Vitreous [facility]  I have reviewed the above documentation for accuracy and completeness, and I agree with the above. Gardiner Sleeper, M.D., Ph.D. 07/08/20 8:31 AM  Abbreviations: M myopia (nearsighted); A astigmatism; H hyperopia (farsighted); P presbyopia; Mrx spectacle prescription;  CTL contact lenses; OD right eye; OS left eye; OU both eyes  XT exotropia; ET esotropia; PEK punctate epithelial keratitis; PEE punctate epithelial erosions; DES dry eye syndrome; MGD meibomian gland dysfunction; ATs artificial tears; PFAT's preservative free artificial tears; [place] nuclear sclerotic cataract; PSC posterior subcapsular cataract; ERM epi-retinal membrane; PVD posterior vitreous detachment; RD retinal detachment; DM diabetes mellitus; DR diabetic retinopathy; NPDR non-proliferative diabetic retinopathy; PDR proliferative diabetic  retinopathy; CSME clinically significant macular edema; DME diabetic macular edema; dbh dot blot hemorrhages; CWS cotton wool spot; POAG primary open angle glaucoma; C/D cup-to-disc ratio; HVF humphrey visual field; GVF goldmann visual field; OCT optical coherence tomography; IOP intraocular pressure; BRVO Branch retinal vein occlusion; CRVO central retinal vein occlusion; CRAO central retinal artery occlusion; BRAO branch retinal artery occlusion; RT retinal tear; SB scleral buckle; PPV pars plana vitrectomy; VH Vitreous hemorrhage; PRP panretinal laser photocoagulation; IVK intravitreal kenalog; VMT vitreomacular traction; MH Macular hole;  NVD neovascularization of the disc; NVE neovascularization elsewhere; AREDS age related eye disease study; ARMD age related macular degeneration; POAG primary open angle glaucoma; EBMD epithelial/anterior basement membrane dystrophy; ACIOL anterior chamber intraocular lens; IOL intraocular lens; PCIOL posterior chamber intraocular lens; Phaco/IOL phacoemulsification with intraocular lens placement; [place] photorefractive keratectomy; LASIK laser assisted in situ keratomileusis; HTN hypertension; DM diabetes mellitus; COPD chronic obstructive pulmonary disease

## 2020-07-08 ENCOUNTER — Ambulatory Visit (INDEPENDENT_AMBULATORY_CARE_PROVIDER_SITE_OTHER): Payer: BC Managed Care – PPO | Admitting: Ophthalmology

## 2020-07-08 ENCOUNTER — Other Ambulatory Visit: Payer: Self-pay

## 2020-07-08 ENCOUNTER — Encounter (INDEPENDENT_AMBULATORY_CARE_PROVIDER_SITE_OTHER): Payer: Self-pay | Admitting: Ophthalmology

## 2020-07-08 DIAGNOSIS — H3581 Retinal edema: Secondary | ICD-10-CM | POA: Diagnosis not present

## 2020-07-08 DIAGNOSIS — H353112 Nonexudative age-related macular degeneration, right eye, intermediate dry stage: Secondary | ICD-10-CM | POA: Diagnosis not present

## 2020-07-08 DIAGNOSIS — I10 Essential (primary) hypertension: Secondary | ICD-10-CM

## 2020-07-08 DIAGNOSIS — H25813 Combined forms of age-related cataract, bilateral: Secondary | ICD-10-CM

## 2020-07-08 DIAGNOSIS — H353221 Exudative age-related macular degeneration, left eye, with active choroidal neovascularization: Secondary | ICD-10-CM | POA: Diagnosis not present

## 2020-07-08 DIAGNOSIS — H3552 Pigmentary retinal dystrophy: Secondary | ICD-10-CM

## 2020-07-08 DIAGNOSIS — H35033 Hypertensive retinopathy, bilateral: Secondary | ICD-10-CM

## 2020-07-08 MED ORDER — AFLIBERCEPT 2MG/0.05ML IZ SOLN FOR KALEIDOSCOPE
2.0000 mg | INTRAVITREAL | Status: AC | PRN
  Administered 2020-07-08: 2 mg via INTRAVITREAL

## 2020-08-08 NOTE — Progress Notes (Shared)
[facility] Note  08/12/2020     CHIEF COMPLAINT Patient presents for No chief complaint on file.   HISTORY OF PRESENT ILLNESS: Eric Key is a 65 y.o. male who presents to the clinic today for:   pt   Referring physician: [name], [name], MD [place],  [place] [id number]  HISTORICAL INFORMATION:   Selected notes from the MEDICAL RECORD NUMBER Referred by Dr. [name] for concern of mac heme OS.   CURRENT MEDICATIONS: No current outpatient medications on file. (Ophthalmic Drugs)   No current facility-administered medications for this visit. (Ophthalmic Drugs)   Current Outpatient Medications (Other)  Medication Sig  . HYDROcodone-acetaminophen (NORCO/VICODIN) 5-325 MG tablet Take 1 tablet by mouth every 6 (six) hours as needed for moderate pain.  Marland Kitchen HYDROcodone-acetaminophen (NORCO/VICODIN) 5-325 MG tablet TAKE 1 TABLET BY MOUTH EVERY 6 HOURS AS NEEDED  . ondansetron (ZOFRAN) 4 MG tablet Take 1 tablet (4 mg total) by mouth every 8 (eight) hours as needed for nausea or vomiting.  . tamsulosin (FLOMAX) 0.4 MG CAPS capsule Take 1 capsule (0.4 mg total) by mouth daily.  . tamsulosin (FLOMAX) 0.4 MG CAPS capsule TAKE 1 CAPSULE BY MOUTH ONCE DAILY AFTER SUPPER FOR 7 DAYS   No current facility-administered medications for this visit. (Other)      REVIEW OF SYSTEMS:    ALLERGIES Allergies  Allergen Reactions  . Penicillins Hives    PAST MEDICAL HISTORY Past Medical History:  Diagnosis Date  . Cataract    Mixed form OU  . History of adenomatous polyps of colon 03/16/2020  . Hypertension    past hx   . Hypertensive retinopathy    OU  . Macular degeneration    Dry OD; Wet OS   Past Surgical History:  Procedure Laterality Date  . ACHILLES TENDON REPAIR  2010  . CHOLECYSTECTOMY    . COLONOSCOPY    . GALLBLADDER SURGERY      FAMILY HISTORY Family History  Problem Relation Age of Onset  . Diabetes Brother    . Heart disease Brother   . Diabetes Maternal Grandfather   . Colon polyps Neg Hx   . Colon cancer Neg Hx   . Esophageal cancer Neg Hx   . Rectal cancer Neg Hx   . Stomach cancer Neg Hx     SOCIAL HISTORY Social History   Tobacco Use  . Smoking status: Former Smoker    Quit date: 02/05/2020    Years since quitting: 0.5  . Smokeless tobacco: Never Used  Vaping Use  . Vaping Use: Never used  Substance Use Topics  . Alcohol use: Yes    Comment: occ  . Drug use: Never         OPHTHALMIC EXAM:  Not recorded     IMAGING AND PROCEDURES  Imaging and Procedures for _0 @           ASSESSMENT/PLAN:    ICD-10-CM   1. Exudative age-related macular degeneration of left eye with active choroidal neovascularization ([place])  H35.3221   2. Retinal edema  H35.81   3. 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 Essential hypertension  I10   6. Hypertensive retinopathy of both eyes  H35.033   7. Combined forms of age-related cataract of both eyes  H25.813    1,2. 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, OS  - s/p IVA  OS #1 (03.04.21), #2 (03.04.21), #3 (04.30.21) -- IVA resistance  - s/p IVE OS #1 (06.04.21), #2 (07.08.21), #3 (08.09.21), #4 (09.13.21), #5 (10.18.21), #6 (11.24.21), #7 (12.31.21), #8 (02.11.22),#9 (03.25.22)  - pt reports stable vision OS  - exam shows central CNV with +subretinal heme -- improving  - BCVA 20/25 OS (stably improved)  - OCT shows persistent cystic chagnes / IRF overlying PED/SRHM temporal macula -- slightly increased -- at 6 wk interval  - recommend IVE OS #10 today, 04.29.22 w/ interval back to 5 wks  - pt wishes to proceed  - RBA of procedure discussed, questions answered  - informed consent obtained and signed  - see procedure note  - IVA consent form signed and scanned, 03.04.21  - IVE consent form signed and scanned, 06.04.21  - Eylea4U  benefits investigation started, 04.30.21 -- approved as of 06.04.21  - f/u in 5 wks -- DFE/OCT, possible injection  3. Age related macular degeneration, non-exudative, OD  - The incidence, anatomy, and pathology of dry AMD, risk of progression, and the AREDS and AREDS 2 study including smoking risks discussed with patient.  - Recommend amsler grid monitoring    4. Pigmentary retinopathy OU  - peripheral pigmented CR scarring and perivascular pigment clumping / bone spicules OU  - VA remains fairly good (20/20 OD, 20/40 OS with exudative ARMD OS)  - denies night blindness  - denies family history of vision problems  - discussed findings  - broad differential for pigmentary retinopathy:  Congenital (RP), infectious (TB, lyme dx, syphilis, toxo, bartonella), autoimmune and toxic etiologies  - labs checked (4.2.21):  All essential WNL   Rheumatoid factor   CBC   Comprehensive metabolic panel     RPR   ANA   ESR, CRP   ACE   ANCA   Toxoplasma antibodies IgG / IgM   Quantiferon - TB Gold Plus   HLA, A, B, C (IR)   Fluorescent treponemal ab (fla) - IgG - bld   VDRL Serum   Bartonella antibody, 06.04.21  - [facility] -- drawn on 6.4.21                         Results Positive for AGBL5, ALMS1, OPA1 -- unknown significance             - f/u 4-5 weeks  5,6. Hypertensive retinopathy OU  - discussed importance of tight BP control  - monitor  7. Mixed form age related cataracts OU  - The symptoms of cataract, surgical options, and treatments and risks were discussed with patient.  - discussed diagnosis and progression  - not yet visually significant  - monitor for now    Ophthalmic Meds Ordered this visit:  No orders of the defined types were placed in this encounter.      No follow-ups on file.  There are no Patient Instructions on file for this visit.  This document serves as a record of services personally performed by Gardiner Sleeper, MD, PhD. It was created  on their behalf by San Jetty. Owens Shark, OA an ophthalmic technician. The creation of this record is the provider's dictation and/or activities during the visit.    Electronically signed by: San Jetty. [place], [place] 04.25.2022 8:12 AM  Gardiner Sleeper, M.D., Ph.D. Diseases & Surgery of the Retina and Vitreous [facility]   Abbreviations: M myopia (nearsighted); A astigmatism; H hyperopia (farsighted); P presbyopia; Mrx spectacle  prescription;  CTL contact lenses; OD right eye; OS left eye; OU both eyes  XT exotropia; ET esotropia; PEK punctate epithelial keratitis; PEE punctate epithelial erosions; DES dry eye syndrome; MGD meibomian gland dysfunction; ATs artificial tears; PFAT's preservative free artificial tears; [place] nuclear sclerotic cataract; PSC posterior subcapsular cataract; ERM epi-retinal membrane; PVD posterior vitreous detachment; RD retinal detachment; DM diabetes mellitus; DR diabetic retinopathy; NPDR non-proliferative diabetic retinopathy; PDR proliferative diabetic retinopathy; CSME clinically significant macular edema; DME diabetic macular edema; dbh dot blot hemorrhages; CWS cotton wool spot; POAG primary open angle glaucoma; C/D cup-to-disc ratio; HVF humphrey visual field; GVF goldmann visual field; OCT optical coherence tomography; IOP intraocular pressure; BRVO Branch retinal vein occlusion; CRVO central retinal vein occlusion; CRAO central retinal artery occlusion; BRAO branch retinal artery occlusion; RT retinal tear; SB scleral buckle; PPV pars plana vitrectomy; VH Vitreous hemorrhage; PRP panretinal laser photocoagulation; IVK intravitreal kenalog; VMT vitreomacular traction; MH Macular hole;  NVD neovascularization of the disc; NVE neovascularization elsewhere; AREDS age related eye disease study; ARMD age related macular degeneration; POAG primary open angle glaucoma; EBMD epithelial/anterior basement membrane dystrophy; ACIOL anterior chamber intraocular lens; IOL  intraocular lens; PCIOL posterior chamber intraocular lens; Phaco/IOL phacoemulsification with intraocular lens placement; [place] photorefractive keratectomy; LASIK laser assisted in situ keratomileusis; HTN hypertension; DM diabetes mellitus; COPD chronic obstructive pulmonary disease

## 2020-08-12 ENCOUNTER — Encounter (INDEPENDENT_AMBULATORY_CARE_PROVIDER_SITE_OTHER): Payer: BC Managed Care – PPO | Admitting: Ophthalmology

## 2020-08-12 DIAGNOSIS — H3581 Retinal edema: Secondary | ICD-10-CM

## 2020-08-12 DIAGNOSIS — H25813 Combined forms of age-related cataract, bilateral: Secondary | ICD-10-CM

## 2020-08-12 DIAGNOSIS — H3552 Pigmentary retinal dystrophy: Secondary | ICD-10-CM

## 2020-08-12 DIAGNOSIS — I10 Essential (primary) hypertension: Secondary | ICD-10-CM

## 2020-08-12 DIAGNOSIS — H353112 Nonexudative age-related macular degeneration, right eye, intermediate dry stage: Secondary | ICD-10-CM

## 2020-08-12 DIAGNOSIS — H35033 Hypertensive retinopathy, bilateral: Secondary | ICD-10-CM

## 2020-08-12 DIAGNOSIS — H353221 Exudative age-related macular degeneration, left eye, with active choroidal neovascularization: Secondary | ICD-10-CM

## 2020-08-15 ENCOUNTER — Encounter (INDEPENDENT_AMBULATORY_CARE_PROVIDER_SITE_OTHER): Payer: Self-pay | Admitting: Ophthalmology

## 2020-08-15 ENCOUNTER — Other Ambulatory Visit: Payer: Self-pay

## 2020-08-15 ENCOUNTER — Ambulatory Visit (INDEPENDENT_AMBULATORY_CARE_PROVIDER_SITE_OTHER): Payer: BC Managed Care – PPO | Admitting: Ophthalmology

## 2020-08-15 DIAGNOSIS — H353221 Exudative age-related macular degeneration, left eye, with active choroidal neovascularization: Secondary | ICD-10-CM | POA: Diagnosis not present

## 2020-08-15 DIAGNOSIS — H3552 Pigmentary retinal dystrophy: Secondary | ICD-10-CM

## 2020-08-15 DIAGNOSIS — H3581 Retinal edema: Secondary | ICD-10-CM

## 2020-08-15 DIAGNOSIS — H25813 Combined forms of age-related cataract, bilateral: Secondary | ICD-10-CM

## 2020-08-15 DIAGNOSIS — H353112 Nonexudative age-related macular degeneration, right eye, intermediate dry stage: Secondary | ICD-10-CM

## 2020-08-15 DIAGNOSIS — I10 Essential (primary) hypertension: Secondary | ICD-10-CM

## 2020-08-15 DIAGNOSIS — H35033 Hypertensive retinopathy, bilateral: Secondary | ICD-10-CM

## 2020-08-15 MED ORDER — AFLIBERCEPT 2MG/0.05ML IZ SOLN FOR KALEIDOSCOPE
2.0000 mg | INTRAVITREAL | Status: AC | PRN
  Administered 2020-08-15: 2 mg via INTRAVITREAL

## 2020-08-15 NOTE — Progress Notes (Signed)
[facility] Note  08/15/2020     CHIEF COMPLAINT Patient presents for Retina Follow Up   HISTORY OF PRESENT ILLNESS: Eric Key is a 65 y.o. male who presents to the clinic today for:   HPI    Retina Follow Up    Patient presents with  Wet AMD.  In left eye.  This started 5 weeks ago.  Since onset it is stable.  I, the attending physician,  performed the HPI with the patient and updated documentation appropriately.          Comments    Pt is here for 5 week exu retinal eval ARMD OS. Pt states vision is good, no changes noted. No ocular pain present.        Last edited by [name], MD on 08/15/2020  8:06 AM. (History)    pt states vision is stable, eyes are watery   Referring physician: [name], [name], MD [place],  [place] [address]  HISTORICAL INFORMATION:   Selected notes from the MEDICAL RECORD NUMBER Referred by Dr. [name] for concern of mac heme OS.   CURRENT MEDICATIONS: No current outpatient medications on file. (Ophthalmic Drugs)   No current facility-administered medications for this visit. (Ophthalmic Drugs)   Current Outpatient Medications (Other)  Medication Sig  . HYDROcodone-acetaminophen (NORCO/VICODIN) 5-325 MG tablet Take 1 tablet by mouth every 6 (six) hours as needed for moderate pain.  Marland Kitchen HYDROcodone-acetaminophen (NORCO/VICODIN) 5-325 MG tablet TAKE 1 TABLET BY MOUTH EVERY 6 HOURS AS NEEDED  . ondansetron (ZOFRAN) 4 MG tablet Take 1 tablet (4 mg total) by mouth every 8 (eight) hours as needed for nausea or vomiting.  . tamsulosin (FLOMAX) 0.4 MG CAPS capsule Take 1 capsule (0.4 mg total) by mouth daily.  . tamsulosin (FLOMAX) 0.4 MG CAPS capsule TAKE 1 CAPSULE BY MOUTH ONCE DAILY AFTER SUPPER FOR 7 DAYS   No current facility-administered medications for this visit. (Other)      REVIEW OF SYSTEMS: ROS    Positive for: Eyes   Negative for: Constitutional, Gastrointestinal, Neurological,  Skin, Genitourinary, Musculoskeletal, HENT, Endocrine, Cardiovascular, Respiratory, Psychiatric, Allergic/Imm, Heme/Lymph   Last edited by [name], COT on 08/15/2020  7:37 AM. (History)       ALLERGIES Allergies  Allergen Reactions  . Penicillins Hives    PAST MEDICAL HISTORY Past Medical History:  Diagnosis Date  . Cataract    Mixed form OU  . History of adenomatous polyps of colon 03/16/2020  . Hypertension    past hx   . Hypertensive retinopathy    OU  . Macular degeneration    Dry OD; Wet OS   Past Surgical History:  Procedure Laterality Date  . ACHILLES TENDON REPAIR  2010  . CHOLECYSTECTOMY    . COLONOSCOPY    . GALLBLADDER SURGERY      FAMILY HISTORY Family History  Problem Relation Age of Onset  . Diabetes Brother   . Heart disease Brother   . Diabetes Maternal Grandfather   . Colon polyps Neg Hx   . Colon cancer Neg Hx   . Esophageal cancer Neg Hx   . Rectal cancer Neg Hx   . Stomach cancer Neg Hx     SOCIAL HISTORY Social History   Tobacco Use  . Smoking status: Former Smoker    Quit date: 02/05/2020    Years since quitting: 0.5  . Smokeless tobacco: Never Used  Vaping Use  .  Vaping Use: Never used  Substance Use Topics  . Alcohol use: Yes    Comment: occ  . Drug use: Never         OPHTHALMIC EXAM:  Base Eye Exam    Visual Acuity (Snellen - Linear)      Right Left   Dist cc 20/20 20/30   Dist ph cc  NI   Correction: Glasses       Tonometry (Tonopen, 7:45 AM)      Right Left   Pressure 12 10       Pupils      Dark Light Shape React APD   Right 3 2 Round Brisk None   Left 3 2 Round Brisk None       Visual Fields (Counting fingers)      Left Right    Full Full       Extraocular Movement      Right Left    Full, Ortho Full, Ortho       Neuro/Psych    Oriented x3: Yes   Mood/Affect: Normal       Dilation    Both eyes: 1.0% Mydriacyl, 2.5% Phenylephrine @ 7:45 AM        Slit Lamp and Fundus Exam     Slit Lamp Exam      Right Left   Lids/Lashes Dermatochalasis - upper lid Dermatochalasis - upper lid   Conjunctiva/Sclera White and quiet White and quiet   Cornea 1-2+ PEE, mild Arcus, mild Debris in tear film mild Arcus, mild Debris in tear film, 3+ Punctate epithelial erosions, decreased TBUT   Anterior Chamber deep, clear, narrow angles deep, clear, narrow angles   Iris Round and moderately dilated, mild anterior bowing Round and moderately dilated, mild anterior bowing   Lens 2+ Nuclear sclerosis, 2+ Cortical cataract 2+ Nuclear sclerosis, 2+ Cortical cataract   Vitreous Vitreous syneresis Vitreous syneresis       Fundus Exam      Right Left   Disc Pink and Sharp, focal PPP ST, Compact Pink and Sharp, Compact   C/D Ratio 0.3 0.3   Macula Flat, Drusen, RPE mottling and clumping, early Atrophy, punctate IRH Abnormal foveal reflex, +CNV with heme improved -- just punctate heme remains; +central edema -- improved, Drusen, RPE mottling and clumping   Vessels attenuated, mild tortuousity attenuated, mild tortuousity   Periphery Attached, peripheral pigmented CR scarring and perivascular bone spicules 360 Attached, peripheral pigmented CR scarring and perivascular bone spicules 360        Refraction    Wearing Rx      Sphere Cylinder Axis   Right [place] +1.25 177   Left [place] +0.75 170          IMAGING AND PROCEDURES  Imaging and Procedures for _0 @  OCT, Retina - OU - Both Eyes       Right Eye Quality was good. Central Foveal Thickness: 297. Progression has been stable. Findings include normal foveal contour, no IRF, no SRF, retinal drusen , outer retinal atrophy, pigment epithelial detachment (Persistent vitreous opacities -- slightly improved; stable low lying PEDs).   Left Eye Quality was good. Central Foveal Thickness: 322. Progression has improved. Findings include abnormal foveal contour, intraretinal fluid, subretinal hyper-reflective material, pigment epithelial  detachment, outer retinal atrophy, retinal drusen  (Mild interval improvement in IRF temporal macula overlying PED/SRHM temporal macula).   Notes *Images captured and stored on drive  Diagnosis / Impression:  OD: non-exu ARMD  OS: exu ARMD --  Mild interval improvement in IRF temporal macula overlying PED/SRHM temporal macula  Clinical management:  See below  Abbreviations: NFP - Normal foveal profile. CME - cystoid macular edema. PED - pigment epithelial detachment. IRF - intraretinal fluid. SRF - subretinal fluid. EZ - ellipsoid zone. ERM - epiretinal membrane. ORA - outer retinal atrophy. ORT - outer retinal tubulation. SRHM - subretinal hyper-reflective material        Intravitreal Injection, Pharmacologic Agent - OS - Left Eye       Time Out 08/15/2020. 8:00 AM. Confirmed correct patient, procedure, site, and patient consented.   Anesthesia Topical anesthesia was used. Anesthetic medications included Lidocaine 2%, Proparacaine 0.5%.   Procedure Preparation included 5% betadine to ocular surface, eyelid speculum. A (32g) needle was used.   Injection:  2 mg aflibercept Alfonse Flavors) SOLN   NDC: A3590391, Lot: [id number], Expiration date: 04/15/2021   Route: Intravitreal, Site: Left Eye, Waste: 0.05 mL  Post-op Post injection exam found visual acuity of at least counting fingers. The patient tolerated the procedure well. There were no complications. The patient received written and verbal post procedure care education. Post injection medications were not given.                 ASSESSMENT/PLAN:    ICD-10-CM   1. Exudative age-related macular degeneration of left eye with active choroidal neovascularization (HCC)  H35.3221 Intravitreal Injection, Pharmacologic Agent - OS - Left Eye    aflibercept (EYLEA) SOLN 2 mg  2. Retinal edema  H35.81 OCT, Retina - OU - Both Eyes  3. 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 Essential hypertension  I10   6. Hypertensive retinopathy of both eyes  H35.033   7. Combined forms of age-related cataract of both eyes  H25.813    1,2. 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, OS  - s/p IVA OS #1 (03.04.21), #2 (03.04.21), #3 (04.30.21) -- IVA resistance  - s/p IVE OS #1 (06.04.21), #2 (07.08.21), #3 (08.09.21), #4 (09.13.21), #5 (10.18.21), #6 (11.24.21), #7 (12.31.21), #8 (02.11.22), #9 (03.25.22)  - pt reports stable vision OS  - exam shows central CNV with +subretinal heme -- improving  - BCVA 20/30 OS (decreased)  - OCT shows mild interval improvement in IRF temporal macula overlying PED/SRHM temporal macula  - recommend IVE OS #10 today, 05.02.22   - pt wishes to proceed  - RBA of procedure discussed, questions answered  - informed consent obtained and signed  - see procedure note  - IVA consent form signed and scanned, 03.04.21  - IVE consent form signed and scanned, 06.04.21  - [employer]yle[employer] benefits investigation started, 04.30.21 -- approved as of 06.04.21  - f/u in 5 wks -- DFE/OCT, possible injection  3. Age related macular degeneration, non-exudative, OD  - The incidence, anatomy, and pathology of dry AMD, risk of progression, and the AREDS and AREDS 2 study including smoking risks discussed with patient.  - Recommend amsler grid monitoring    4. Pigmentary retinopathy OU  - peripheral pigmented CR scarring and perivascular pigment clumping / bone spicules OU  - VA remains fairly good (20/20 OD, 20/40 OS with exudative ARMD OS)  - denies night blindness  - denies family history of vision problems  - discussed findings  - broad differential for pigmentary retinopathy:  Congenital (RP), infectious (TB, lyme dx, syphilis, toxo, bartonella), autoimmune and toxic etiologies  - labs checked (4.2.21):  All essential WNL  Rheumatoid factor   CBC   Comprehensive metabolic panel     RPR   ANA   ESR,  CRP   ACE   ANCA   Toxoplasma antibodies IgG / IgM   Quantiferon - TB Gold Plus   HLA, A, B, C (IR)   Fluorescent treponemal ab (fla) - IgG - bld   VDRL Serum   Bartonella antibody, 06.04.21  - [facility] -- drawn on 6.4.21                         Results Positive for AGBL5, ALMS1, OPA1 -- unknown significance             - f/u 4-5 weeks  5,6. Hypertensive retinopathy OU  - discussed importance of tight BP control  - monitor  7. Mixed form age related cataracts OU  - The symptoms of cataract, surgical options, and treatments and risks were discussed with patient.  - discussed diagnosis and progression  - not yet visually significant  - monitor for now    Ophthalmic Meds Ordered this visit:  Meds ordered this encounter  Medications  . aflibercept (EYLEA) SOLN 2 mg       Return in about 5 weeks (around 09/19/2020) for f/u exu ARMD OS, DFE, OCT.  There are no Patient Instructions on file for this visit.  This document serves as a record of services personally performed by Gardiner Sleeper, MD, PhD. It was created on their behalf by San Jetty. Owens Shark, OA an ophthalmic technician. The creation of this record is the provider's dictation and/or activities during the visit.    Electronically signed by: San Jetty. [place], [place] 05.02.2022 8:22 AM  Gardiner Sleeper, M.D., Ph.D. Diseases & Surgery of the Retina and Vitreous [facility]  I have reviewed the above documentation for accuracy and completeness, and I agree with the above. Gardiner Sleeper, M.D., Ph.D. 08/15/20 8:22 AM   Abbreviations: M myopia (nearsighted); A astigmatism; H hyperopia (farsighted); P presbyopia; Mrx spectacle prescription;  CTL contact lenses; OD right eye; OS left eye; OU both eyes  XT exotropia; ET esotropia; PEK punctate epithelial keratitis; PEE punctate epithelial erosions; DES dry eye syndrome; MGD meibomian gland dysfunction; ATs artificial tears; PFAT's preservative free  artificial tears; [place] nuclear sclerotic cataract; PSC posterior subcapsular cataract; ERM epi-retinal membrane; PVD posterior vitreous detachment; RD retinal detachment; DM diabetes mellitus; DR diabetic retinopathy; NPDR non-proliferative diabetic retinopathy; PDR proliferative diabetic retinopathy; CSME clinically significant macular edema; DME diabetic macular edema; dbh dot blot hemorrhages; CWS cotton wool spot; POAG primary open angle glaucoma; C/D cup-to-disc ratio; HVF humphrey visual field; GVF goldmann visual field; OCT optical coherence tomography; IOP intraocular pressure; BRVO Branch retinal vein occlusion; CRVO central retinal vein occlusion; CRAO central retinal artery occlusion; BRAO branch retinal artery occlusion; RT retinal tear; SB scleral buckle; PPV pars plana vitrectomy; VH Vitreous hemorrhage; PRP panretinal laser photocoagulation; IVK intravitreal kenalog; VMT vitreomacular traction; MH Macular hole;  NVD neovascularization of the disc; NVE neovascularization elsewhere; AREDS age related eye disease study; ARMD age related macular degeneration; POAG primary open angle glaucoma; EBMD epithelial/anterior basement membrane dystrophy; ACIOL anterior chamber intraocular lens; IOL intraocular lens; PCIOL posterior chamber intraocular lens; Phaco/IOL phacoemulsification with intraocular lens placement; [place] photorefractive keratectomy; LASIK laser assisted in situ keratomileusis; HTN hypertension; DM diabetes mellitus; COPD chronic obstructive pulmonary disease

## 2020-09-15 NOTE — Progress Notes (Signed)
[facility] Note  09/19/2020     CHIEF COMPLAINT Patient presents for Retina Follow Up   HISTORY OF PRESENT ILLNESS: Eric Key is a 65 y.o. male who presents to the clinic today for:   HPI    Retina Follow Up    Patient presents with  Wet AMD.  In left eye.  This started weeks ago.  Severity is moderate.  Duration of weeks.  Since onset it is stable.  I, the attending physician,  performed the HPI with the patient and updated documentation appropriately.          Comments    Pt states vision is the same OU.  Pt had some eye pain OS memorial day weekend.  States pain lasted a couple days and went away on its own.  States it felt like someone had punched him in the eye.       Last edited by [name], MD on 09/19/2020  8:27 AM. (History)    pt states vision is stable   Referring physician: [name], [name], MD [place],  [place] [id number]  HISTORICAL INFORMATION:   Selected notes from the MEDICAL RECORD NUMBER Referred by Dr. [name] for concern of mac heme OS.   CURRENT MEDICATIONS: No current outpatient medications on file. (Ophthalmic Drugs)   No current facility-administered medications for this visit. (Ophthalmic Drugs)   Current Outpatient Medications (Other)  Medication Sig  . HYDROcodone-acetaminophen (NORCO/VICODIN) 5-325 MG tablet Take 1 tablet by mouth every 6 (six) hours as needed for moderate pain.  Marland Kitchen HYDROcodone-acetaminophen (NORCO/VICODIN) 5-325 MG tablet TAKE 1 TABLET BY MOUTH EVERY 6 HOURS AS NEEDED  . ondansetron (ZOFRAN) 4 MG tablet Take 1 tablet (4 mg total) by mouth every 8 (eight) hours as needed for nausea or vomiting.  . tamsulosin (FLOMAX) 0.4 MG CAPS capsule Take 1 capsule (0.4 mg total) by mouth daily.  . tamsulosin (FLOMAX) 0.4 MG CAPS capsule TAKE 1 CAPSULE BY MOUTH ONCE DAILY AFTER SUPPER FOR 7 DAYS   No current facility-administered medications for this visit. (Other)      REVIEW  OF SYSTEMS: ROS    Positive for: Eyes   Negative for: Constitutional, Gastrointestinal, Neurological, Skin, Genitourinary, Musculoskeletal, HENT, Endocrine, Cardiovascular, Respiratory, Psychiatric, Allergic/Imm, Heme/Lymph   Last edited by Doneen Poisson on 09/19/2020  7:41 AM. (History)       ALLERGIES Allergies  Allergen Reactions  . Penicillins Hives    PAST MEDICAL HISTORY Past Medical History:  Diagnosis Date  . Cataract    Mixed form OU  . History of adenomatous polyps of colon 03/16/2020  . Hypertension    past hx   . Hypertensive retinopathy    OU  . Macular degeneration    Dry OD; Wet OS   Past Surgical History:  Procedure Laterality Date  . ACHILLES TENDON REPAIR  2010  . CHOLECYSTECTOMY    . COLONOSCOPY    . GALLBLADDER SURGERY      FAMILY HISTORY Family History  Problem Relation Age of Onset  . Diabetes Brother   . Heart disease Brother   . Diabetes Maternal Grandfather   . Colon polyps Neg Hx   . Colon cancer Neg Hx   . Esophageal cancer Neg Hx   . Rectal cancer Neg Hx   . Stomach cancer Neg Hx     SOCIAL HISTORY Social History   Tobacco Use  . Smoking status: Former Smoker  Quit date: 02/05/2020    Years since quitting: 0.6  . Smokeless tobacco: Never Used  Vaping Use  . Vaping Use: Never used  Substance Use Topics  . Alcohol use: Yes    Comment: occ  . Drug use: Never         OPHTHALMIC EXAM:  Base Eye Exam    Visual Acuity (Snellen - Linear)      Right Left   Dist cc 20/20 -1 20/30 +2   Dist ph cc  NI   Correction: Glasses       Tonometry (Tonopen, 7:45 AM)      Right Left   Pressure 10 13       Pupils      Dark Light Shape React APD   Right 3 2 Round Minimal 0   Left 3 2 Round Minimal 0       Visual Fields      Left Right    Full Full       Extraocular Movement      Right Left    Full Full       Neuro/Psych    Oriented x3: Yes   Mood/Affect: Normal       Dilation    Both eyes: 1.0% Mydriacyl,  2.5% Phenylephrine @ 7:45 AM        Slit Lamp and Fundus Exam    Slit Lamp Exam      Right Left   Lids/Lashes Dermatochalasis - upper lid Dermatochalasis - upper lid   Conjunctiva/Sclera White and quiet White and quiet   Cornea 1-2+ PEE, mild Arcus, mild Debris in tear film mild Arcus, mild Debris in tear film, 3+ Punctate epithelial erosions, decreased TBUT   Anterior Chamber deep, clear, narrow angles deep, clear, narrow angles   Iris Round and moderately dilated, mild anterior bowing Round and moderately dilated, mild anterior bowing   Lens 2+ Nuclear sclerosis, 2+ Cortical cataract 2+ Nuclear sclerosis, 2+ Cortical cataract   Vitreous Vitreous syneresis Vitreous syneresis       Fundus Exam      Right Left   Disc Pink and Sharp, focal PPP ST, Compact Pink and Sharp, Compact   C/D Ratio 0.3 0.3   Macula Flat, Drusen, RPE mottling and clumping, early Atrophy, punctate IRH Abnormal foveal reflex, +CNV with heme improved -- just punctate heme remains; +central edema -- slightly increased, Drusen, RPE mottling and clumping   Vessels attenuated, mild tortuousity attenuated, mild tortuousity   Periphery Attached, peripheral pigmented CR scarring and perivascular bone spicules 360 Attached, peripheral pigmented CR scarring and perivascular bone spicules 360        Refraction    Wearing Rx      Sphere Cylinder Axis   Right [place] +1.25 177   Left [place] +0.75 170          IMAGING AND PROCEDURES  Imaging and Procedures for _0 @  OCT, Retina - OU - Both Eyes       Right Eye Quality was good. Central Foveal Thickness: 303. Progression has been stable. Findings include normal foveal contour, no IRF, no SRF, retinal drusen , outer retinal atrophy, pigment epithelial detachment (Persistent vitreous opacities -- slightly improved; stable low lying PEDs).   Left Eye Quality was good. Central Foveal Thickness: 363. Progression has worsened. Findings include abnormal foveal contour,  intraretinal fluid, subretinal hyper-reflective material, pigment epithelial detachment, outer retinal atrophy, retinal drusen  (Mild interval increase in IRF temporal macula overlying PED/SRHM).   Notes *Images  captured and stored on drive  Diagnosis / Impression:  OD: non-exu ARMD  OS: exu ARMD -- Mild interval increase in IRF temporal macula overlying PED/SRHM temporal macula  Clinical management:  See below  Abbreviations: NFP - Normal foveal profile. CME - cystoid macular edema. PED - pigment epithelial detachment. IRF - intraretinal fluid. SRF - subretinal fluid. EZ - ellipsoid zone. ERM - epiretinal membrane. ORA - outer retinal atrophy. ORT - outer retinal tubulation. SRHM - subretinal hyper-reflective material        Intravitreal Injection, Pharmacologic Agent - OS - Left Eye       Time Out 09/19/2020. 7:54 AM. Confirmed correct patient, procedure, site, and patient consented.   Anesthesia Topical anesthesia was used. Anesthetic medications included Lidocaine 2%, Proparacaine 0.5%.   Procedure Preparation included 5% betadine to ocular surface, eyelid speculum. A supplied (32g) needle was used.   Injection:  2 mg aflibercept Alfonse Flavors) SOLN   NDC: A3590391, Lot: [id number], Expiration date: 04/14/2022   Route: Intravitreal, Site: Left Eye, Waste: 0.05 mL  Post-op Post injection exam found visual acuity of at least counting fingers. The patient tolerated the procedure well. There were no complications. The patient received written and verbal post procedure care education.                 ASSESSMENT/PLAN:    ICD-10-CM   1. Exudative age-related macular degeneration of left eye with active choroidal neovascularization (HCC)  H35.3221 Intravitreal Injection, Pharmacologic Agent - OS - Left Eye    aflibercept (EYLEA) SOLN 2 mg  2. Retinal edema  H35.81 OCT, Retina - OU - Both Eyes  3. 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 Essential hypertension  I10   6. Hypertensive retinopathy of both eyes  H35.033   7. Combined forms of age-related cataract of both eyes  H25.813    1,2. 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, OS  - s/p IVA OS #1 (03.04.21), #2 (03.04.21), #3 (04.30.21) -- IVA resistance  - s/p IVE OS #1 (06.04.21), #2 (07.08.21), #3 (08.09.21), #4 (09.13.21), #5 (10.18.21), #6 (11.24.21), #7 (12.31.21), #8 (02.11.22), #9 (03.25.22), #10 (05.02.22)  - pt reports stable vision OS  - exam shows central CNV with +subretinal heme -- improving  - BCVA 20/30 OS (stable)  - OCT shows mild interval increase in IRF temporal macula overlying PED/SRHM temporal macula  - recommend IVE OS #11 today, 06.06.22   - pt wishes to proceed  - RBA of procedure discussed, questions answered  - informed consent obtained and signed  - see procedure note  - IVA consent form signed and scanned, 03.04.21  - IVE consent form signed and scanned, 06.04.21  - Eylea[employer] benefits investigation started, 04.30.21 -- approved as of 06.04.21  - f/u in 5 wks -- DFE/OCT, possible injection  3. Age related macular degeneration, non-exudative, OD  - The incidence, anatomy, and pathology of dry AMD, risk of progression, and the AREDS and AREDS 2 study including smoking risks discussed with patient.  - Recommend amsler grid monitoring    4. Pigmentary retinopathy OU  - peripheral pigmented CR scarring and perivascular pigment clumping / bone spicules OU  - VA remains fairly good (20/20 OD, 20/40 OS with exudative ARMD OS)  - denies night blindness  - denies family history of vision problems  - discussed findings  - broad differential for pigmentary retinopathy:  Congenital (RP), infectious (TB, lyme dx, syphilis,  toxo, bartonella), autoimmune and toxic etiologies  - labs checked (4.2.21):  All essential WNL   Rheumatoid factor   CBC   Comprehensive metabolic  panel     RPR   ANA   ESR, CRP   ACE   ANCA   Toxoplasma antibodies IgG / IgM   Quantiferon - TB Gold Plus   HLA, A, B, C (IR)   Fluorescent treponemal ab (fla) - IgG - bld   VDRL Serum   Bartonella antibody, 06.04.21  - [facility] -- drawn on 6.4.21                         Results Positive for AGBL5, ALMS1, OPA1 -- unknown significance             - f/u 4-5 weeks  5,6. Hypertensive retinopathy OU  - discussed importance of tight BP control  - monitor  7. Mixed form age related cataracts OU  - The symptoms of cataract, surgical options, and treatments and risks were discussed with patient.  - discussed diagnosis and progression  - not yet visually significant  - monitor for now    Ophthalmic Meds Ordered this visit:  Meds ordered this encounter  Medications  . aflibercept (EYLEA) SOLN 2 mg       Return in about 5 weeks (around 10/24/2020) for f/u exu ARMD OS, DFE, OCT.  There are no Patient Instructions on file for this visit.  This document serves as a record of services personally performed by Gardiner Sleeper, MD, PhD. It was created on their behalf by Leeann Must, Todd, an ophthalmic technician. The creation of this record is the provider's dictation and/or activities during the visit.    Electronically signed by: Leeann Must, COA _0 @ 9:00 AM   This document serves as a record of services personally performed by Gardiner Sleeper, MD, PhD. It was created on their behalf by San Jetty. Owens Shark, OA an ophthalmic technician. The creation of this record is the provider's dictation and/or activities during the visit.    Electronically signed by: San Jetty. Owens Shark, [place] 06.06.2022 9:00 AM  Gardiner Sleeper, M.D., Ph.D. Diseases & Surgery of the Retina and [place] 09/19/2020   I have reviewed the above documentation for accuracy and completeness, and I agree with the above. Gardiner Sleeper, M.D., Ph.D. 09/19/20 9:00  AM   Abbreviations: M myopia (nearsighted); A astigmatism; H hyperopia (farsighted); P presbyopia; Mrx spectacle prescription;  CTL contact lenses; OD right eye; OS left eye; OU both eyes  XT exotropia; ET esotropia; PEK punctate epithelial keratitis; PEE punctate epithelial erosions; DES dry eye syndrome; MGD meibomian gland dysfunction; ATs artificial tears; PFAT's preservative free artificial tears; [place] nuclear sclerotic cataract; PSC posterior subcapsular cataract; ERM epi-retinal membrane; PVD posterior vitreous detachment; RD retinal detachment; DM diabetes mellitus; DR diabetic retinopathy; NPDR non-proliferative diabetic retinopathy; PDR proliferative diabetic retinopathy; CSME clinically significant macular edema; DME diabetic macular edema; dbh dot blot hemorrhages; CWS cotton wool spot; POAG primary open angle glaucoma; C/D cup-to-disc ratio; HVF humphrey visual field; GVF goldmann visual field; OCT optical coherence tomography; IOP intraocular pressure; BRVO Branch retinal vein occlusion; CRVO central retinal vein occlusion; CRAO central retinal artery occlusion; BRAO branch retinal artery occlusion; RT retinal tear; SB scleral buckle; PPV pars plana vitrectomy; VH Vitreous hemorrhage; PRP panretinal laser photocoagulation; IVK intravitreal kenalog; VMT vitreomacular traction; MH Macular hole;  NVD neovascularization of the disc; NVE  neovascularization elsewhere; AREDS age related eye disease study; ARMD age related macular degeneration; POAG primary open angle glaucoma; EBMD epithelial/anterior basement membrane dystrophy; ACIOL anterior chamber intraocular lens; IOL intraocular lens; PCIOL posterior chamber intraocular lens; Phaco/IOL phacoemulsification with intraocular lens placement; [place] photorefractive keratectomy; LASIK laser assisted in situ keratomileusis; HTN hypertension; DM diabetes mellitus; COPD chronic obstructive pulmonary disease

## 2020-09-19 ENCOUNTER — Other Ambulatory Visit: Payer: Self-pay

## 2020-09-19 ENCOUNTER — Ambulatory Visit (INDEPENDENT_AMBULATORY_CARE_PROVIDER_SITE_OTHER): Payer: BC Managed Care – PPO | Admitting: Ophthalmology

## 2020-09-19 ENCOUNTER — Encounter (INDEPENDENT_AMBULATORY_CARE_PROVIDER_SITE_OTHER): Payer: Self-pay | Admitting: Ophthalmology

## 2020-09-19 DIAGNOSIS — H35033 Hypertensive retinopathy, bilateral: Secondary | ICD-10-CM

## 2020-09-19 DIAGNOSIS — H353221 Exudative age-related macular degeneration, left eye, with active choroidal neovascularization: Secondary | ICD-10-CM

## 2020-09-19 DIAGNOSIS — H353112 Nonexudative age-related macular degeneration, right eye, intermediate dry stage: Secondary | ICD-10-CM

## 2020-09-19 DIAGNOSIS — I10 Essential (primary) hypertension: Secondary | ICD-10-CM

## 2020-09-19 DIAGNOSIS — H3581 Retinal edema: Secondary | ICD-10-CM | POA: Diagnosis not present

## 2020-09-19 DIAGNOSIS — H3552 Pigmentary retinal dystrophy: Secondary | ICD-10-CM | POA: Diagnosis not present

## 2020-09-19 DIAGNOSIS — H25813 Combined forms of age-related cataract, bilateral: Secondary | ICD-10-CM

## 2020-09-19 MED ORDER — AFLIBERCEPT 2MG/0.05ML IZ SOLN FOR KALEIDOSCOPE
2.0000 mg | INTRAVITREAL | Status: AC | PRN
  Administered 2020-09-19: 2 mg via INTRAVITREAL

## 2020-10-24 NOTE — Progress Notes (Signed)
[facility] Note  10/26/2020     CHIEF COMPLAINT Patient presents for Retina Follow Up   HISTORY OF PRESENT ILLNESS: Eric Key is a 64 y.o. male who presents to the clinic today for:   HPI     Retina Follow Up   Patient presents with  Wet AMD.  In left eye.  Duration of 5 weeks.  Since onset it is stable.  I, the attending physician,  performed the HPI with the patient and updated documentation appropriately.        Comments   5 week follow up Exu ARMD OS- Vision appears stable OU.       Last edited by [name], MD on 10/26/2020  8:09 AM.     pt states vision is "fine"   Referring physician: [name], [name], MD [place],  [place] [address]  HISTORICAL INFORMATION:   Selected notes from the MEDICAL RECORD NUMBER Referred by Dr. [name] for concern of mac heme OS.   CURRENT MEDICATIONS: No current outpatient medications on file. (Ophthalmic Drugs)   No current facility-administered medications for this visit. (Ophthalmic Drugs)   Current Outpatient Medications (Other)  Medication Sig   HYDROcodone-acetaminophen (NORCO/VICODIN) 5-325 MG tablet Take 1 tablet by mouth every 6 (six) hours as needed for moderate pain. (Patient not taking: Reported on 10/26/2020)   ondansetron (ZOFRAN) 4 MG tablet Take 1 tablet (4 mg total) by mouth every 8 (eight) hours as needed for nausea or vomiting. (Patient not taking: Reported on 10/26/2020)   tamsulosin (FLOMAX) 0.4 MG CAPS capsule Take 1 capsule (0.4 mg total) by mouth daily. (Patient not taking: Reported on 10/26/2020)   tamsulosin (FLOMAX) 0.4 MG CAPS capsule TAKE 1 CAPSULE BY MOUTH ONCE DAILY AFTER SUPPER FOR 7 DAYS (Patient not taking: Reported on 10/26/2020)   No current facility-administered medications for this visit. (Other)      REVIEW OF SYSTEMS: ROS   Positive for: Genitourinary, Eyes Negative for: Constitutional, Gastrointestinal, Neurological, Skin,  Musculoskeletal, HENT, Endocrine, Cardiovascular, Respiratory, Psychiatric, Allergic/Imm, Heme/Lymph Last edited by Leonie Douglas, COA on 10/26/2020  7:47 AM.        ALLERGIES Allergies  Allergen Reactions   Penicillins Hives    PAST MEDICAL HISTORY Past Medical History:  Diagnosis Date   Cataract    Mixed form OU   History of adenomatous polyps of colon 03/16/2020   Hypertension    past hx    Hypertensive retinopathy    OU   Macular degeneration    Dry OD; Wet OS   Past Surgical History:  Procedure Laterality Date   ACHILLES TENDON REPAIR  2010   CHOLECYSTECTOMY     COLONOSCOPY     GALLBLADDER SURGERY      FAMILY HISTORY Family History  Problem Relation Age of Onset   Diabetes Brother    Heart disease Brother    Diabetes Maternal Grandfather    Colon polyps Neg Hx    Colon cancer Neg Hx    Esophageal cancer Neg Hx    Rectal cancer Neg Hx    Stomach cancer Neg Hx     SOCIAL HISTORY Social History   Tobacco Use   Smoking status: Former    Types: Cigarettes    Quit date: 02/05/2020    Years since quitting: 0.7   Smokeless tobacco: Never  Vaping Use   Vaping Use: Never used  Substance Use Topics   Alcohol use: Yes  Comment: occ   Drug use: Never         OPHTHALMIC EXAM:  Base Eye Exam     Visual Acuity (Snellen - Linear)       Right Left   Dist cc 20/20- 20/30+   Dist ph cc  NI         Tonometry (Tonopen, 7:52 AM)       Right Left   Pressure 12 10         Pupils       Dark Light Shape React APD   Right 3 2 Round Brisk None   Left 3 2 Round Brisk None         Visual Fields (Counting fingers)       Left Right    Full Full         Extraocular Movement       Right Left    Full Full         Neuro/Psych     Oriented x3: Yes   Mood/Affect: Normal         Dilation     Both eyes: 1.0% Mydriacyl, 2.5% Phenylephrine @ 7:52 AM           Slit Lamp and Fundus Exam     Slit Lamp Exam       Right Left    Lids/Lashes Dermatochalasis - upper lid Dermatochalasis - upper lid   Conjunctiva/Sclera White and quiet White and quiet   Cornea 1-2+ PEE, mild Arcus, mild Debris in tear film mild Arcus, mild Debris in tear film, 3+ Punctate epithelial erosions, decreased TBUT   Anterior Chamber deep, clear, narrow angles deep, clear, narrow angles   Iris Round and moderately dilated, mild anterior bowing Round and moderately dilated, mild anterior bowing   Lens 2+ Nuclear sclerosis, 2+ Cortical cataract 2+ Nuclear sclerosis, 2+ Cortical cataract   Vitreous Vitreous syneresis Vitreous syneresis         Fundus Exam       Right Left   Disc Pink and Sharp, focal PPP ST, Compact Pink and Sharp, Compact   C/D Ratio 0.3 0.3   Macula Flat, Drusen, RPE mottling and clumping, early Atrophy, punctate IRH Abnormal foveal reflex, +CNV with heme improved, +central edema -- slightly improved, Drusen, RPE mottling and clumping   Vessels attenuated, mild tortuousity attenuated, mild tortuousity   Periphery Attached, peripheral pigmented CR scarring and perivascular bone spicules 360 Attached, peripheral pigmented CR scarring and perivascular bone spicules 360           Refraction     Wearing Rx       Sphere Cylinder Axis Add   Right [place] +1.25 177 +2.25   Left [place] +0.75 170 +2.25            IMAGING AND PROCEDURES  Imaging and Procedures for _0 @  OCT, Retina - OU - Both Eyes       Right Eye Quality was good. Central Foveal Thickness: 308. Progression has been stable. Findings include normal foveal contour, no IRF, no SRF, retinal drusen , outer retinal atrophy, pigment epithelial detachment (Persistent vitreous opacities; stable low lying PEDs).   Left Eye Quality was good. Central Foveal Thickness: 351. Progression has improved. Findings include abnormal foveal contour, intraretinal fluid, subretinal hyper-reflective material, pigment epithelial detachment, outer retinal atrophy, retinal  drusen (Interval improvement in IRF/cystic changes temporal fovea).   Notes *Images captured and stored on drive  Diagnosis / Impression:  OD: non-exu ARMD  OS: exu ARMD -- Interval improvement in IRF/cystic changes temporal fovea  Clinical management:  See below  Abbreviations: NFP - Normal foveal profile. CME - cystoid macular edema. PED - pigment epithelial detachment. IRF - intraretinal fluid. SRF - subretinal fluid. EZ - ellipsoid zone. ERM - epiretinal membrane. ORA - outer retinal atrophy. ORT - outer retinal tubulation. SRHM - subretinal hyper-reflective material      Intravitreal Injection, Pharmacologic Agent - OS - Left Eye       Time Out 10/26/2020. 8:05 AM. Confirmed correct patient, procedure, site, and patient consented.   Anesthesia Topical anesthesia was used. Anesthetic medications included Lidocaine 2%, Proparacaine 0.5%.   Procedure Preparation included 5% betadine to ocular surface, eyelid speculum. A (32g) needle was used.   Injection: 2 mg aflibercept 2 MG/0.05ML   Route: Intravitreal, Site: Left Eye   NDC: [id number], Lot: [id number], Expiration date: 08/13/2021, Waste: 0.05 mL   Post-op Post injection exam found visual acuity of at least counting fingers. The patient tolerated the procedure well. There were no complications. The patient received written and verbal post procedure care education. Post injection medications were not given.               ASSESSMENT/PLAN:    ICD-10-CM   1. Exudative age-related macular degeneration of left eye with active choroidal neovascularization (HCC)  H35.3221 Intravitreal Injection, Pharmacologic Agent - OS - Left Eye    aflibercept (EYLEA) SOLN 2 mg    2. Retinal edema  H35.81 OCT, Retina - OU - Both Eyes    3. 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 Essential hypertension  I10     6. Hypertensive  retinopathy of both eyes  H35.033     7. Combined forms of age-related cataract of both eyes  H25.813       1,2. 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, OS  - s/p IVA OS #1 (03.04.21), #2 (03.04.21), #3 (04.30.21) -- IVA resistance  - s/p IVE OS #1 (06.04.21), #2 (07.08.21), #3 (08.09.21), #4 (09.13.21), #5 (10.18.21), #6 (11.24.21), #7 (12.31.21), #8 (02.11.22), #9 (03.25.22), #10 (05.02.22), #11 (06.06.22)  - pt reports stable vision OS  - exam shows central CNV with +subretinal heme -- improving  - BCVA 20/30 OS (stable)  - OCT shows mild interval improvement in IRF temporal macula overlying PED/SRHM temporal macula  - recommend IVE OS #12 today, 07.13.22   - pt wishes to proceed  - RBA of procedure discussed, questions answered  - informed consent obtained and signed  - see procedure note  - IVA consent form signed and scanned, 03.04.21  - IVE consent form signed and scanned, 06.04.21  - Eylea4U benefits investigation started, 04.30.21 -- approved as of 06.04.21  - f/u in 5 wks -- DFE/OCT, possible injection  3. Age related macular degeneration, non-exudative, OD  - The incidence, anatomy, and pathology of dry AMD, risk of progression, and the AREDS and AREDS 2 study including smoking risks discussed with patient.  - recommend amsler grid monitoring   4. Pigmentary retinopathy OU  - peripheral pigmented CR scarring and perivascular pigment clumping / bone spicules OU  - VA remains fairly good (20/20 OD, 20/40 OS with exudative ARMD OS)  - denies night blindness  - denies family history of vision problems  - discussed findings  - broad differential for pigmentary retinopathy:  Congenital (RP), infectious (TB, lyme dx, syphilis, toxo,  bartonella), autoimmune and toxic etiologies  - labs checked (4.2.21):  All essential WNL   Rheumatoid factor   CBC   Comprehensive metabolic panel     RPR   ANA   ESR, CRP   ACE   ANCA   Toxoplasma antibodies IgG / IgM   Quantiferon -  TB Gold Plus   HLA, A, B, C (IR)   Fluorescent treponemal ab (fla) - IgG - bld   VDRL Serum   Bartonella antibody, 06.04.21  - [facility] -- drawn on 6.4.21                         Results Positive for AGBL5, ALMS1, OPA1 -- unknown significance             - f/u in 4-5 weeks  5,6. Hypertensive retinopathy OU  - discussed importance of tight BP control  - monitor  7. Mixed form age related cataracts OU  - The symptoms of cataract, surgical options, and treatments and risks were discussed with patient.  - discussed diagnosis and progression  - not yet visually significant  - monitor for now    Ophthalmic Meds Ordered this visit:  Meds ordered this encounter  Medications   aflibercept (EYLEA) SOLN 2 mg        Return in about 5 weeks (around 11/30/2020) for f/u exu ARMD OS, DFE, OCT.  There are no Patient Instructions on file for this visit.  This document serves as a record of services personally performed by Gardiner Sleeper, MD, PhD. It was created on their behalf by Roselee Nova, COMT. The creation of this record is the provider's dictation and/or activities during the visit.  Electronically signed by: Roselee Nova, COMT 10/26/20 10:47 PM  This document serves as a record of services personally performed by Gardiner Sleeper, MD, PhD. It was created on their behalf by San Jetty. Owens Shark, OA an ophthalmic technician. The creation of this record is the provider's dictation and/or activities during the visit.    Electronically signed by: San Jetty. Marguerita Merles 07.13.2022 10:47 PM   Gardiner Sleeper, M.D., Ph.D. Diseases & Surgery of the Retina and Vitreous [facility]  I have reviewed the above documentation for accuracy and completeness, and I agree with the above. Gardiner Sleeper, M.D., Ph.D. 10/26/20 11:08 PM   Abbreviations: M myopia (nearsighted); A astigmatism; H hyperopia (farsighted); P presbyopia; Mrx spectacle prescription;  CTL contact  lenses; OD right eye; OS left eye; OU both eyes  XT exotropia; ET esotropia; PEK punctate epithelial keratitis; PEE punctate epithelial erosions; DES dry eye syndrome; MGD meibomian gland dysfunction; ATs artificial tears; PFAT's preservative free artificial tears; [place] nuclear sclerotic cataract; PSC posterior subcapsular cataract; ERM epi-retinal membrane; PVD posterior vitreous detachment; RD retinal detachment; DM diabetes mellitus; DR diabetic retinopathy; NPDR non-proliferative diabetic retinopathy; PDR proliferative diabetic retinopathy; CSME clinically significant macular edema; DME diabetic macular edema; dbh dot blot hemorrhages; CWS cotton wool spot; POAG primary open angle glaucoma; C/D cup-to-disc ratio; HVF humphrey visual field; GVF goldmann visual field; OCT optical coherence tomography; IOP intraocular pressure; BRVO Branch retinal vein occlusion; CRVO central retinal vein occlusion; CRAO central retinal artery occlusion; BRAO branch retinal artery occlusion; RT retinal tear; SB scleral buckle; PPV pars plana vitrectomy; VH Vitreous hemorrhage; PRP panretinal laser photocoagulation; IVK intravitreal kenalog; VMT vitreomacular traction; MH Macular hole;  NVD neovascularization of the disc; NVE neovascularization elsewhere; AREDS age related eye  disease study; ARMD age related macular degeneration; POAG primary open angle glaucoma; EBMD epithelial/anterior basement membrane dystrophy; ACIOL anterior chamber intraocular lens; IOL intraocular lens; PCIOL posterior chamber intraocular lens; Phaco/IOL phacoemulsification with intraocular lens placement; [place] photorefractive keratectomy; LASIK laser assisted in situ keratomileusis; HTN hypertension; DM diabetes mellitus; COPD chronic obstructive pulmonary disease

## 2020-10-26 ENCOUNTER — Ambulatory Visit (INDEPENDENT_AMBULATORY_CARE_PROVIDER_SITE_OTHER): Payer: BC Managed Care – PPO | Admitting: Ophthalmology

## 2020-10-26 ENCOUNTER — Encounter (INDEPENDENT_AMBULATORY_CARE_PROVIDER_SITE_OTHER): Payer: Self-pay | Admitting: Ophthalmology

## 2020-10-26 ENCOUNTER — Other Ambulatory Visit: Payer: Self-pay

## 2020-10-26 DIAGNOSIS — H35033 Hypertensive retinopathy, bilateral: Secondary | ICD-10-CM

## 2020-10-26 DIAGNOSIS — H3581 Retinal edema: Secondary | ICD-10-CM | POA: Diagnosis not present

## 2020-10-26 DIAGNOSIS — H3552 Pigmentary retinal dystrophy: Secondary | ICD-10-CM

## 2020-10-26 DIAGNOSIS — H353112 Nonexudative age-related macular degeneration, right eye, intermediate dry stage: Secondary | ICD-10-CM

## 2020-10-26 DIAGNOSIS — I10 Essential (primary) hypertension: Secondary | ICD-10-CM

## 2020-10-26 DIAGNOSIS — H353221 Exudative age-related macular degeneration, left eye, with active choroidal neovascularization: Secondary | ICD-10-CM | POA: Diagnosis not present

## 2020-10-26 DIAGNOSIS — H25813 Combined forms of age-related cataract, bilateral: Secondary | ICD-10-CM

## 2020-10-26 MED ORDER — AFLIBERCEPT 2MG/0.05ML IZ SOLN FOR KALEIDOSCOPE
2.0000 mg | INTRAVITREAL | Status: AC | PRN
  Administered 2020-10-26: 2 mg via INTRAVITREAL

## 2020-11-14 ENCOUNTER — Ambulatory Visit (INDEPENDENT_AMBULATORY_CARE_PROVIDER_SITE_OTHER): Payer: BC Managed Care – PPO

## 2020-11-14 ENCOUNTER — Encounter: Payer: Self-pay | Admitting: Nurse Practitioner

## 2020-11-14 ENCOUNTER — Ambulatory Visit (INDEPENDENT_AMBULATORY_CARE_PROVIDER_SITE_OTHER): Payer: BC Managed Care – PPO | Admitting: Nurse Practitioner

## 2020-11-14 ENCOUNTER — Other Ambulatory Visit: Payer: Self-pay

## 2020-11-14 VITALS — BP 138/84 | HR 83 | Temp 97.4°F | Ht 73.0 in | Wt 246.0 lb

## 2020-11-14 DIAGNOSIS — S8991XA Unspecified injury of right lower leg, initial encounter: Secondary | ICD-10-CM | POA: Diagnosis not present

## 2020-11-14 DIAGNOSIS — M25561 Pain in right knee: Secondary | ICD-10-CM | POA: Diagnosis not present

## 2020-11-14 MED ORDER — PREDNISONE 10 MG (21) PO TBPK: ORAL_TABLET | ORAL | 0 refills | Status: DC

## 2020-11-14 MED ORDER — DICLOFENAC SODIUM 75 MG PO TBEC: 75.0000 mg | DELAYED_RELEASE_TABLET | Freq: Two times a day (BID) | ORAL | 0 refills | Status: DC

## 2020-11-14 NOTE — Assessment & Plan Note (Signed)
New right knee pain not well controlled in the last 3 weeks.  Patient stepped down from a height and locked knee while standing hard on the floor.  Advil has not been therapeutic.  X-ray of the right knee completed results pending.  Prednisone taper, Voltaren 75 mg tablet by mouth, continue ice pack/warm compress as tolerated.,  Immobilize joint, and give time to heal.  Patient verbalized understanding Rx sent to pharmacy.  Patient knows to follow-up with worsening unresolved symptoms.

## 2020-11-14 NOTE — Progress Notes (Signed)
Acute Office Visit  Subjective:    Patient ID: FODAY BRADEN, male    DOB: Apr 27, 1955, 65 y.o.   MRN: [id number]  Chief Complaint  Patient presents with   Knee Pain    Knee Pain  The incident occurred more than 1 week ago. The incident occurred at work. The injury mechanism was a twisting injury. The pain is present in the right knee. The quality of the pain is described as aching. The pain is at a severity of 2/10. The pain is mild (8/10 with long sitting). The pain has been Intermittent since onset. Pertinent negatives include no loss of motion, loss of sensation, numbness or tingling. He reports no foreign bodies present. The symptoms are aggravated by movement. He has tried NSAIDs for the symptoms. The treatment provided mild relief.    Past Medical History:  Diagnosis Date   Cataract    Mixed form OU   History of adenomatous polyps of colon 03/16/2020   Hypertension    past hx    Hypertensive retinopathy    OU   Macular degeneration    Dry OD; Wet OS    Past Surgical History:  Procedure Laterality Date   ACHILLES TENDON REPAIR  2010   CHOLECYSTECTOMY     COLONOSCOPY     GALLBLADDER SURGERY      Family History  Problem Relation Age of Onset   Diabetes Brother    Heart disease Brother    Diabetes Maternal Grandfather    Colon polyps Neg Hx    Colon cancer Neg Hx    Esophageal cancer Neg Hx    Rectal cancer Neg Hx    Stomach cancer Neg Hx     Social History   Socioeconomic History   Marital status: Divorced    Spouse name: Not on file   Number of children: Not on file   Years of education: Not on file   Highest education level: Not on file  Occupational History    Employer: [employer]  Tobacco Use   Smoking status: Former    Types: Cigarettes    Quit date: 02/05/2020    Years since quitting: 0.7   Smokeless tobacco: Never  Vaping Use   Vaping Use: Never used  Substance and Sexual Activity   Alcohol use: Yes    Comment: occ   Drug  use: Never   Sexual activity: Yes    Birth control/protection: Surgical  Other Topics Concern   Not on file  Social History Narrative   Not on file   Social Determinants of Health   Financial Resource Strain: Not on file  Food Insecurity: Not on file  Transportation Needs: Not on file  Physical Activity: Not on file  Stress: Not on file  Social Connections: Not on file  Intimate Partner Violence: Not on file    Outpatient Medications Prior to Visit  Medication Sig Dispense Refill   HYDROcodone-acetaminophen (NORCO/VICODIN) 5-325 MG tablet Take 1 tablet by mouth every 6 (six) hours as needed for moderate pain. (Patient not taking: Reported on 10/26/2020) 40 tablet 0   ondansetron (ZOFRAN) 4 MG tablet Take 1 tablet (4 mg total) by mouth every 8 (eight) hours as needed for nausea or vomiting. (Patient not taking: Reported on 10/26/2020) 4 tablet 0   tamsulosin (FLOMAX) 0.4 MG CAPS capsule Take 1 capsule (0.4 mg total) by mouth daily. (Patient not taking: Reported on 10/26/2020) 30 capsule 3   tamsulosin (FLOMAX) 0.4 MG CAPS capsule TAKE  1 CAPSULE BY MOUTH ONCE DAILY AFTER SUPPER FOR 7 DAYS (Patient not taking: Reported on 10/26/2020) 7 capsule 0   No facility-administered medications prior to visit.    Allergies  Allergen Reactions   Penicillins Hives    Review of Systems  Constitutional: Negative.   HENT: Negative.    Eyes: Negative.   Respiratory: Negative.    Cardiovascular: Negative.   Gastrointestinal: Negative.   Musculoskeletal: Negative.        Right knee pain  Skin:  Negative for rash.  Neurological:  Negative for tingling and numbness.  All other systems reviewed and are negative.     Objective:    Physical Exam Vitals and nursing note reviewed.  Constitutional:      Appearance: Normal appearance.  HENT:     Head: Normocephalic.     Nose: Nose normal.  Eyes:     Conjunctiva/sclera: Conjunctivae normal.  Cardiovascular:     Rate and Rhythm: Normal rate  and regular rhythm.     Pulses: Normal pulses.     Heart sounds: Normal heart sounds.  Pulmonary:     Effort: Pulmonary effort is normal.     Breath sounds: Normal breath sounds.  Abdominal:     General: Bowel sounds are normal.  Musculoskeletal:     Right knee: Decreased range of motion. Tenderness present.  Skin:    Findings: No rash.  Neurological:     Mental Status: He is alert and oriented to person, place, and time.    BP 138/84   Pulse 83   Temp (!) 97.4 F (36.3 C) (Temporal)   Ht '6\' 1"'$  (1.854 m)   Wt 246 lb (111.6 kg)   SpO2 97%   BMI 32.46 kg/m  Wt Readings from Last 3 Encounters:  11/14/20 246 lb (111.6 kg)  04/21/20 254 lb (115.2 kg)  04/12/20 250 lb (113.4 kg)    Health Maintenance Due  Topic Date Due   Zoster Vaccines- Shingrix (1 of 2) Never done   COVID-19 Vaccine (3 - Booster for Moderna series) 05/24/2020   INFLUENZA VACCINE  11/14/2020    There are no preventive care reminders to display for this patient.   No results found for: TSH Lab Results  Component Value Date   WBC 10.0 04/12/2020   HGB 14.6 04/12/2020   HCT 44.2 04/12/2020   MCV 92.1 04/12/2020   PLT 200 04/12/2020   Lab Results  Component Value Date   NA 135 04/12/2020   K 4.0 04/12/2020   CO2 23 04/12/2020   GLUCOSE 157 (H) 04/12/2020   BUN 12 04/12/2020   CREATININE 1.06 04/12/2020   BILITOT 0.8 04/12/2020   ALKPHOS 45 04/12/2020   AST 17 04/12/2020   ALT 15 04/12/2020   PROT 8.2 (H) 04/12/2020   ALBUMIN 4.1 04/12/2020   CALCIUM 8.8 (L) 04/12/2020   ANIONGAP 10 04/12/2020   Lab Results  Component Value Date   CHOL 166 12/04/2019   Lab Results  Component Value Date   HDL 33 (L) 12/04/2019   Lab Results  Component Value Date   LDLCALC 116 (H) 12/04/2019   Lab Results  Component Value Date   TRIG 91 12/04/2019   Lab Results  Component Value Date   CHOLHDL 5.0 12/04/2019   No results found for: HGBA1C     Assessment & Plan:   Problem List Items  Addressed This Visit       Other   Acute pain of right knee - Primary  New right knee pain not well controlled in the last 3 weeks.  Patient stepped down from a height and locked knee while standing hard on the floor.  Advil has not been therapeutic.  X-ray of the right knee completed results pending.  Prednisone taper, Voltaren 75 mg tablet by mouth, continue ice pack/warm compress as tolerated.,  Immobilize joint, and give time to heal.  Patient verbalized understanding Rx sent to pharmacy.  Patient knows to follow-up with worsening unresolved symptoms.         Relevant Medications   diclofenac (VOLTAREN) 75 MG EC tablet   predniSONE (STERAPRED UNI-PAK 21 TAB) 10 MG (21) TBPK tablet   Other Relevant Orders   DG Knee 1-2 Views Right     Meds ordered this encounter  Medications   diclofenac (VOLTAREN) 75 MG EC tablet    Sig: Take 1 tablet (75 mg total) by mouth 2 (two) times daily.    Dispense:  30 tablet    Refill:  0    Order Specific Question:   Supervising Provider    Answer:   Janora Norlander [id number]   predniSONE (STERAPRED UNI-PAK 21 TAB) 10 MG (21) TBPK tablet    Sig: 6 tablet day 1, 5 tablet day 2, 4 tablet day 3, 3 tablet day 4, 2 tablet day 5, 1 tablet day 6    Dispense:  1 each    Refill:  0    Order Specific Question:   Supervising Provider    Answer:   Janora Norlander [id number]      Ivy Lynn, NP

## 2020-11-14 NOTE — Patient Instructions (Signed)
Acute Knee Pain, Adult °Many things can cause knee pain. Sometimes, knee pain is sudden (acute) and may be caused by damage, swelling, or irritation of the muscles and tissues that support your knee. °The pain often goes away on its own with time and rest. If the pain does not go away, tests may be done to find out what is causing the pain. °Follow these instructions at home: °If you have a knee sleeve or brace: ° °Wear the knee sleeve or brace as told by your doctor. Take it off only as told by your doctor. °Loosen it if your toes: °Tingle. °Become numb. °Turn cold and blue. °Keep it clean. °If the knee sleeve or brace is not waterproof: °Do not let it get wet. °Cover it with a watertight covering when you take a bath or shower. °Activity °Rest your knee. °Do not do things that cause pain or make pain worse. °Avoid activities where both feet leave the ground at the same time (high-impact activities). Examples are running, jumping rope, and doing jumping jacks. °Work with a physical therapist to make a safe exercise program, as told by your doctor. °Managing pain, stiffness, and swelling ° °If told, put ice on the knee. To do this: °If you have a removable knee sleeve or brace, take it off as told by your doctor. °Put ice in a plastic bag. °Place a towel between your skin and the bag. °Leave the ice on for 20 minutes, 2-3 times a day. °Take off the ice if your skin turns bright red. This is very important. If you cannot feel pain, heat, or cold, you have a greater risk of damage to the area. °If told, use an elastic bandage to put pressure (compression) on your injured knee. °Raise your knee above the level of your heart while you are sitting or lying down. °Sleep with a pillow under your knee. °General instructions °Take over-the-counter and prescription medicines only as told by your doctor. °Do not smoke or use any products that contain nicotine or tobacco. If you need help quitting, ask your doctor. °If you are  overweight, work with your doctor and a food expert (dietitian) to set goals to lose weight. Being overweight can make your knee hurt more. °Watch for any changes in your symptoms. °Keep all follow-up visits. °Contact a doctor if: °The knee pain does not stop. °The knee pain changes or gets worse. °You have a fever along with knee pain. °Your knee is red or feels warm when you touch it. °Your knee gives out or locks up. °Get help right away if: °Your knee swells, and the swelling gets worse. °You cannot move your knee. °You have very bad knee pain that does not get better with pain medicine. °Summary °Many things can cause knee pain. The pain often goes away on its own with time and rest. °Your doctor may do tests to find out the cause of the pain. °Watch for any changes in your symptoms. Relieve your pain with rest, medicines, light activity, and use of ice. °Get help right away if you cannot move your knee or your knee pain is very bad. °This information is not intended to replace advice given to you by your health care provider. Make sure you discuss any questions you have with your health care provider. °Document Revised: 09/16/2019 Document Reviewed: 09/16/2019 °Elsevier Patient Education © 2022 [employer] ° °

## 2020-11-25 NOTE — Progress Notes (Signed)
[name] Note  11/30/2020     CHIEF COMPLAINT Patient presents for Retina Follow Up   HISTORY OF PRESENT ILLNESS: Eric Key is a 65 y.o. male who presents to the clinic today for:   HPI     Retina Follow Up   Patient presents with  Wet AMD.  In left eye.  This started 5 weeks ago.  I, the attending physician,  performed the HPI with the patient and updated documentation appropriately.        Comments   Patient here for 5 weeks retina follow up for exu ARMD OS. Patient states vision doing fine. No eye pain.       Last edited by [name], MD on 11/30/2020  9:06 AM.    Pt states no change in vision  Referring physician: [name], [name], MD [place],  [place] [id number]  HISTORICAL INFORMATION:   Selected notes from the MEDICAL RECORD NUMBER Referred by Dr. [name] for concern of mac heme OS.   CURRENT MEDICATIONS: No current outpatient medications on file. (Ophthalmic Drugs)   No current facility-administered medications for this visit. (Ophthalmic Drugs)   Current Outpatient Medications (Other)  Medication Sig   diclofenac (VOLTAREN) 75 MG EC tablet Take 1 tablet (75 mg total) by mouth 2 (two) times daily.   predniSONE (STERAPRED UNI-PAK 21 TAB) 10 MG (21) TBPK tablet 6 tablet day 1, 5 tablet day 2, 4 tablet day 3, 3 tablet day 4, 2 tablet day 5, 1 tablet day 6   No current facility-administered medications for this visit. (Other)   REVIEW OF SYSTEMS: ROS   Positive for: Genitourinary, Eyes Negative for: Constitutional, Gastrointestinal, Neurological, Skin, Musculoskeletal, HENT, Endocrine, Cardiovascular, Respiratory, Psychiatric, Allergic/Imm, Heme/Lymph Last edited by Theodore Demark, COA on 11/30/2020  7:39 AM.     ALLERGIES Allergies  Allergen Reactions   Penicillins Hives    PAST MEDICAL HISTORY Past Medical History:  Diagnosis Date   Cataract    Mixed form OU   History of adenomatous  polyps of colon 03/16/2020   Hypertension    past hx    Hypertensive retinopathy    OU   Macular degeneration    Dry OD; Wet OS   Past Surgical History:  Procedure Laterality Date   ACHILLES TENDON REPAIR  2010   CHOLECYSTECTOMY     COLONOSCOPY     GALLBLADDER SURGERY      FAMILY HISTORY Family History  Problem Relation Age of Onset   Diabetes Brother    Heart disease Brother    Diabetes Maternal Grandfather    Colon polyps Neg Hx    Colon cancer Neg Hx    Esophageal cancer Neg Hx    Rectal cancer Neg Hx    Stomach cancer Neg Hx     SOCIAL HISTORY Social History   Tobacco Use   Smoking status: Former    Types: Cigarettes    Quit date: 02/05/2020    Years since quitting: 0.8   Smokeless tobacco: Never  Vaping Use   Vaping Use: Never used  Substance Use Topics   Alcohol use: Yes    Comment: occ   Drug use: Never         OPHTHALMIC EXAM:  Base Eye Exam     Visual Acuity (Snellen - Linear)       Right Left   Dist cc 20/20 -1 20/30 -1   Dist ph cc  NI  Correction: Glasses         Tonometry (Tonopen, 7:37 AM)       Right Left   Pressure 10 05         Pupils       Dark Light Shape React APD   Right 3 2 Round Brisk None   Left 3 2 Round Brisk None         Visual Fields (Counting fingers)       Left Right    Full Full         Extraocular Movement       Right Left    Full Full         Neuro/Psych     Oriented x3: Yes   Mood/Affect: Normal         Dilation     Both eyes: 1.0% Mydriacyl, 2.5% Phenylephrine @ 7:37 AM           Slit Lamp and Fundus Exam     Slit Lamp Exam       Right Left   Lids/Lashes Dermatochalasis - upper lid Dermatochalasis - upper lid   Conjunctiva/Sclera White and quiet White and quiet   Cornea 1-2+ PEE, mild Arcus, mild Debris in tear film mild Arcus, mild Debris in tear film, 3+ Punctate epithelial erosions, decreased TBUT   Anterior Chamber deep, clear, narrow angles deep, clear,  narrow angles   Iris Round and moderately dilated, mild anterior bowing Round and moderately dilated, mild anterior bowing   Lens 2+ Nuclear sclerosis, 2+ Cortical cataract 2+ Nuclear sclerosis, 2+ Cortical cataract   Vitreous Vitreous syneresis Vitreous syneresis         Fundus Exam       Right Left   Disc Pink and Sharp, focal PPP ST, Compact Pink and Sharp, Compact   C/D Ratio 0.3 0.3   Macula Flat, Drusen, RPE mottling and clumping, early Atrophy, punctate IRH Abnormal foveal reflex, +CNV with heme improved, +central edema -- slightly improved, Drusen, RPE mottling and clumping   Vessels attenuated, mild tortuousity attenuated, mild tortuousity   Periphery Attached, peripheral pigmented CR scarring and perivascular bone spicules 360 Attached, peripheral pigmented CR scarring and perivascular bone spicules 360           Refraction     Wearing Rx       Sphere Cylinder Axis Add   Right [place] +1.25 177 +2.25   Left [place] +0.75 170 +2.25            IMAGING AND PROCEDURES  Imaging and Procedures for _0 @  Intravitreal Injection, Pharmacologic Agent - OS - Left Eye       Time Out 11/30/2020. 8:00 AM. Confirmed correct patient, procedure, site, and patient consented.   Anesthesia Topical anesthesia was used. Anesthetic medications included Lidocaine 2%, Proparacaine 0.5%.   Procedure Preparation included 5% betadine to ocular surface, eyelid speculum. A (32g) needle was used.   Injection: 1.25 mg Bevacizumab 1.12m/0.05ml   Route: Intravitreal, Site: Left Eye   NDC:: 88875-797-28 Lot: [id number] Expiration date: 09/13/2021, Waste: 0.05 mL   Post-op Post injection exam found visual acuity of at least counting fingers. The patient tolerated the procedure well. There were no complications. The patient received written and verbal post procedure care education. Post injection medications were not given.      OCT, Retina - OU - Both Eyes       Right  Eye Quality was good. Central Foveal Thickness: 307. Progression has been stable. Findings  include normal foveal contour, no IRF, no SRF, retinal drusen , outer retinal atrophy, pigment epithelial detachment (Persistent vitreous opacities; stable low lying PEDs).   Left Eye Quality was good. Central Foveal Thickness: 339. Progression has improved. Findings include abnormal foveal contour, intraretinal fluid, subretinal hyper-reflective material, pigment epithelial detachment, outer retinal atrophy, retinal drusen (Mild Interval improvement in IRF temporal fovea and macula).   Notes *Images captured and stored on drive  Diagnosis / Impression:  OD: non-exu ARMD  OS: exu ARMD -- Mild Interval improvement in IRF temporal fovea and macula  Clinical management:  See below  Abbreviations: NFP - Normal foveal profile. CME - cystoid macular edema. PED - pigment epithelial detachment. IRF - intraretinal fluid. SRF - subretinal fluid. EZ - ellipsoid zone. ERM - epiretinal membrane. ORA - outer retinal atrophy. ORT - outer retinal tubulation. SRHM - subretinal hyper-reflective material            ASSESSMENT/PLAN:    ICD-10-CM   1. Exudative age-related macular degeneration of left eye with active choroidal neovascularization (HCC)  H35.3221 Intravitreal Injection, Pharmacologic Agent - OS - Left Eye    Bevacizumab (AVASTIN) SOLN 1.25 mg    2. Retinal edema  H35.81 OCT, Retina - OU - Both Eyes    3. 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 Essential hypertension  I10     6. Hypertensive retinopathy of both eyes  H35.033     7. Combined forms of age-related cataract of both eyes  H25.813        1,2. 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, OS  - s/p IVA OS #1 (03.04.21), #2 (03.04.21), #3 (04.30.21) -- IVA resistance  - s/p IVE OS #1 (06.04.21), #2 (07.08.21), #3 (08.09.21), #4 (09.13.21),  #5 (10.18.21), #6 (11.24.21), #7 (12.31.21), #8 (02.11.22), #9 (03.25.22), #10 (05.02.22), #11 (06.06.22), #12 (07.13.22)  - pt reports stable vision OS  - exam shows central CNV with +subretinal heme -- improving  - BCVA 20/30 OS (stable)  - OCT shows mild interval improvement in IRF temporal macula overlying PED/SRHM temporal macula at 5 wks  - recommend IVE OS #13 today, 08.17.22, w/ f/u in 5 wks  - pt wishes to proceed  - RBA of procedure discussed, questions answered  - informed consent obtained and signed  - see procedure note  - IVA consent form signed and scanned, 03.04.21  - IVE consent form signed and scanned, 06.04.21  - [employer] benefits investigation started, 04.30.21 -- approved as of 06.04.21  - f/u in 5 wks -- DFE/OCT, possible injection  3. Age related macular degeneration, non-exudative, OD  - The incidence, anatomy, and pathology of dry AMD, risk of progression, and the AREDS and AREDS 2 study including smoking risks discussed with patient.  - recommend amsler grid monitoring  4. Pigmentary retinopathy OU  - peripheral pigmented CR scarring and perivascular pigment clumping / bone spicules OU  - VA remains fairly good (20/20 OD, 20/40 OS with exudative ARMD OS)  - denies night blindness  - denies family history of vision problems  - discussed findings  - broad differential for pigmentary retinopathy:  Congenital (RP), infectious (TB, lyme dx, syphilis, toxo, bartonella), autoimmune and toxic etiologies  - labs checked (4.2.21):  All essential WNL   Rheumatoid factor   CBC   Comprehensive metabolic panel     RPR   ANA   ESR, CRP  ACE   ANCA   Toxoplasma antibodies IgG / IgM   Quantiferon - TB Gold Plus   HLA, A, B, C (IR)   Fluorescent treponemal ab (fla) - IgG - bld   VDRL Serum   Bartonella antibody, 06.04.21  - [facility] -- drawn on 6.4.21                         Results Positive for AGBL5, ALMS1, OPA1 -- unknown significance             -  f/u in 4-5 weeks  5,6. Hypertensive retinopathy OU  - discussed importance of tight BP control  - monitor  7. Mixed form age related cataracts OU  - The symptoms of cataract, surgical options, and treatments and risks were discussed with patient.  - discussed diagnosis and progression  - not yet visually significant  - monitor for now  Ophthalmic Meds Ordered this visit:  Meds ordered this encounter  Medications   Bevacizumab (AVASTIN) SOLN 1.25 mg      Return in about 5 weeks (around 01/04/2021) for f/u exu ARMD OS, DFE, OCT.  There are no Patient Instructions on file for this visit.  This document serves as a record of services personally performed by Gardiner Sleeper, MD, PhD. It was created on their behalf by Roselee Nova, COMT. The creation of this record is the provider's dictation and/or activities during the visit.  Electronically signed by: Roselee Nova, COMT 11/30/20 9:09 AM  This document serves as a record of services personally performed by Gardiner Sleeper, MD, PhD. It was created on their behalf by San Jetty. Owens Shark, OA an ophthalmic technician. The creation of this record is the provider's dictation and/or activities during the visit.    Electronically signed by: San Jetty. Owens Shark, [place] 08.17.2022 9:09 AM  Gardiner Sleeper, M.D., Ph.D. Diseases & Surgery of the Retina and Vitreous [facility]  I have reviewed the above documentation for accuracy and completeness, and I agree with the above. Gardiner Sleeper, M.D., Ph.D. 11/30/20 9:09 AM   Abbreviations: M myopia (nearsighted); A astigmatism; H hyperopia (farsighted); P presbyopia; Mrx spectacle prescription;  CTL contact lenses; OD right eye; OS left eye; OU both eyes  XT exotropia; ET esotropia; PEK punctate epithelial keratitis; PEE punctate epithelial erosions; DES dry eye syndrome; MGD meibomian gland dysfunction; ATs artificial tears; PFAT's preservative free artificial tears; [place] nuclear  sclerotic cataract; PSC posterior subcapsular cataract; ERM epi-retinal membrane; PVD posterior vitreous detachment; RD retinal detachment; DM diabetes mellitus; DR diabetic retinopathy; NPDR non-proliferative diabetic retinopathy; PDR proliferative diabetic retinopathy; CSME clinically significant macular edema; DME diabetic macular edema; dbh dot blot hemorrhages; CWS cotton wool spot; POAG primary open angle glaucoma; C/D cup-to-disc ratio; HVF humphrey visual field; GVF goldmann visual field; OCT optical coherence tomography; IOP intraocular pressure; BRVO Branch retinal vein occlusion; CRVO central retinal vein occlusion; CRAO central retinal artery occlusion; BRAO branch retinal artery occlusion; RT retinal tear; SB scleral buckle; PPV pars plana vitrectomy; VH Vitreous hemorrhage; PRP panretinal laser photocoagulation; IVK intravitreal kenalog; VMT vitreomacular traction; MH Macular hole;  NVD neovascularization of the disc; NVE neovascularization elsewhere; AREDS age related eye disease study; ARMD age related macular degeneration; POAG primary open angle glaucoma; EBMD epithelial/anterior basement membrane dystrophy; ACIOL anterior chamber intraocular lens; IOL intraocular lens; PCIOL posterior chamber intraocular lens; Phaco/IOL phacoemulsification with intraocular lens placement; [place] photorefractive keratectomy; LASIK laser assisted in situ keratomileusis;  HTN hypertension; DM diabetes mellitus; COPD chronic obstructive pulmonary disease

## 2020-11-30 ENCOUNTER — Ambulatory Visit (INDEPENDENT_AMBULATORY_CARE_PROVIDER_SITE_OTHER): Payer: BC Managed Care – PPO | Admitting: Ophthalmology

## 2020-11-30 ENCOUNTER — Other Ambulatory Visit: Payer: Self-pay

## 2020-11-30 ENCOUNTER — Encounter (INDEPENDENT_AMBULATORY_CARE_PROVIDER_SITE_OTHER): Payer: Self-pay | Admitting: Ophthalmology

## 2020-11-30 DIAGNOSIS — H25813 Combined forms of age-related cataract, bilateral: Secondary | ICD-10-CM

## 2020-11-30 DIAGNOSIS — H35033 Hypertensive retinopathy, bilateral: Secondary | ICD-10-CM

## 2020-11-30 DIAGNOSIS — H353221 Exudative age-related macular degeneration, left eye, with active choroidal neovascularization: Secondary | ICD-10-CM

## 2020-11-30 DIAGNOSIS — H3581 Retinal edema: Secondary | ICD-10-CM

## 2020-11-30 DIAGNOSIS — H3552 Pigmentary retinal dystrophy: Secondary | ICD-10-CM | POA: Diagnosis not present

## 2020-11-30 DIAGNOSIS — I10 Essential (primary) hypertension: Secondary | ICD-10-CM

## 2020-11-30 DIAGNOSIS — H353112 Nonexudative age-related macular degeneration, right eye, intermediate dry stage: Secondary | ICD-10-CM

## 2020-11-30 MED ORDER — BEVACIZUMAB CHEMO INJECTION 1.25MG/0.05ML SYRINGE FOR KALEIDOSCOPE
1.2500 mg | INTRAVITREAL | Status: AC | PRN
  Administered 2020-11-30: 1.25 mg via INTRAVITREAL

## 2020-12-01 ENCOUNTER — Ambulatory Visit (INDEPENDENT_AMBULATORY_CARE_PROVIDER_SITE_OTHER): Payer: BC Managed Care – PPO | Admitting: Family Medicine

## 2020-12-01 ENCOUNTER — Encounter: Payer: Self-pay | Admitting: Family Medicine

## 2020-12-01 VITALS — BP 134/88 | HR 84 | Ht 73.0 in | Wt 241.0 lb

## 2020-12-01 DIAGNOSIS — Z0000 Encounter for general adult medical examination without abnormal findings: Secondary | ICD-10-CM | POA: Diagnosis not present

## 2020-12-01 DIAGNOSIS — Z23 Encounter for immunization: Secondary | ICD-10-CM | POA: Diagnosis not present

## 2020-12-01 DIAGNOSIS — Z136 Encounter for screening for cardiovascular disorders: Secondary | ICD-10-CM | POA: Diagnosis not present

## 2020-12-01 NOTE — Progress Notes (Signed)
BP 134/88   Pulse 84   Ht 6' 1" (1.854 m)   Wt 241 lb (109.3 kg)   SpO2 92%   BMI 31.80 kg/m    Subjective:   [name], male    DOB: 1956-02-11, 65 y.o.   MRN: [id number]  HPI: Eric Key is a 65 y.o. male presenting on 12/01/2020 for Medical Management of Chronic Issues (CPE)   HPI Adult well exam and physical Patient is coming in today for adult well exam and physical and recheck chronic medical issues. Patient denies any chest pain, shortness of breath, headaches or vision issues, abdominal complaints, diarrhea, nausea, vomiting.  Patient does still have a little bit of inflammation in the right knee it pops sometimes although it is improving and he is taking the Voltaren gel.  He says he is able to walk more, it is stiff right when he gets up but it has been improving.  He says he does have some urinary frequency but denies nocturia and says his stream is slightly weaker but still doing okay.  He is still smoking cigars, has tried to reduce some but does not want to quit.  Relevant past medical, surgical, family and social history reviewed and updated as indicated. Interim medical history since our last visit reviewed. Allergies and medications reviewed and updated.  Review of Systems  Constitutional:  Negative for chills and fever.  HENT:  Negative for ear pain and tinnitus.   Eyes:  Negative for pain and visual disturbance.  Respiratory:  Negative for cough, shortness of breath and wheezing.   Cardiovascular:  Negative for chest pain, palpitations and leg swelling.  Gastrointestinal:  Negative for abdominal pain, blood in stool, constipation and diarrhea.  Genitourinary:  Negative for dysuria and hematuria.  Musculoskeletal:  Negative for back pain, gait problem and myalgias.  Skin:  Negative for rash.  Neurological:  Negative for dizziness, weakness and headaches.  Psychiatric/Behavioral:  Negative for suicidal ideas.   All other systems reviewed  and are negative.  Per HPI unless specifically indicated above   Allergies as of 12/01/2020       Reactions   Penicillins Hives        Medication List        Accurate as of December 01, 2020  8:30 AM. If you have any questions, ask your nurse or doctor.          STOP taking these medications    diclofenac 75 MG EC tablet Commonly known as: VOLTAREN Stopped by: [name] Kamilya Wakeman, MD   predniSONE 10 MG (21) Tbpk tablet Commonly known as: STERAPRED UNI-PAK 21 TAB Stopped by: [name] Vaughan Garfinkle, MD         Objective:   BP 134/88   Pulse 84   Ht 6' 1" (1.854 m)   Wt 241 lb (109.3 kg)   SpO2 92%   BMI 31.80 kg/m   Wt Readings from Last 3 Encounters:  12/01/20 241 lb (109.3 kg)  11/14/20 246 lb (111.6 kg)  04/21/20 254 lb (115.2 kg)    Physical Exam Vitals reviewed.  Constitutional:      General: He is not in acute distress.    Appearance: He is well-developed. He is not diaphoretic.  HENT:     Right Ear: External ear normal.     Left Ear: External ear normal.     Nose: Nose normal.     Mouth/Throat:     Pharynx: No oropharyngeal exudate.  Eyes:     General: No scleral icterus.       Right eye: No discharge.        Left eye: No discharge.     Conjunctiva/sclera: Conjunctivae normal.  Neck:     Thyroid: No thyromegaly.  Cardiovascular:     Rate and Rhythm: Normal rate and regular rhythm.     Heart sounds: Normal heart sounds. No murmur heard. Pulmonary:     Effort: Pulmonary effort is normal. No respiratory distress.     Breath sounds: Normal breath sounds. No wheezing.  Abdominal:     General: Bowel sounds are normal. There is no distension.     Palpations: Abdomen is soft.     Tenderness: There is no abdominal tenderness. There is no guarding or rebound.  Genitourinary:    Comments: Declines exam, will do PSA Musculoskeletal:        General: Normal range of motion.     Cervical back: Neck supple.  Lymphadenopathy:     Cervical: No cervical  adenopathy.  Skin:    General: Skin is warm and dry.     Findings: No rash.  Neurological:     Mental Status: He is alert and oriented to person, place, and time.     Coordination: Coordination normal.  Psychiatric:        Behavior: Behavior normal.      Assessment & Plan:   Problem List Items Addressed This Visit   None Visit Diagnoses     Well adult exam    -  Primary   Relevant Orders   CBC with Differential/Platelet   CMP14+EGFR   Lipid panel   PSA, total and free   Need for shingles vaccine       Relevant Orders   Varicella-zoster vaccine IM (Shingrix) (Completed)       We will recheck blood work, cholesterol was elevated last time, he has been trying diet and we will see where it is at today. Follow up plan: Return in about 1 year (around 12/01/2021), or if symptoms worsen or fail to improve, for Well exam.  Counseling provided for all of the vaccine components Orders Placed This Encounter  Procedures   Varicella-zoster vaccine IM (Shingrix)   CBC with Differential/Platelet   CMP14+EGFR   Lipid panel   PSA, total and free    Caryl Pina, MD [facility] 12/01/2020, 8:30 AM

## 2020-12-02 LAB — CBC WITH DIFFERENTIAL/PLATELET
Basophils Absolute: 0.1 10*3/uL (ref 0.0–0.2)
Basos: 1 %
EOS (ABSOLUTE): 0.2 10*3/uL (ref 0.0–0.4)
Eos: 3 %
Hematocrit: 44.4 % (ref 37.5–51.0)
Hemoglobin: 15.1 g/dL (ref 13.0–17.7)
Immature Grans (Abs): 0 10*3/uL (ref 0.0–0.1)
Immature Granulocytes: 0 %
Lymphocytes Absolute: 2.6 10*3/uL (ref 0.7–3.1)
Lymphs: 33 %
MCH: 30.4 pg (ref 26.6–33.0)
MCHC: 34 g/dL (ref 31.5–35.7)
MCV: 90 fL (ref 79–97)
Monocytes Absolute: 0.5 10*3/uL (ref 0.1–0.9)
Monocytes: 7 %
Neutrophils Absolute: 4.4 10*3/uL (ref 1.4–7.0)
Neutrophils: 56 %
Platelets: 133 10*3/uL — ABNORMAL LOW (ref 150–450)
RBC: 4.96 x10E6/uL (ref 4.14–5.80)
RDW: 12.1 % (ref 11.6–15.4)
WBC: 7.9 10*3/uL (ref 3.4–10.8)

## 2020-12-02 LAB — PSA, TOTAL AND FREE
PSA, Free Pct: 22.7 %
PSA, Free: 0.25 ng/mL
Prostate Specific Ag, Serum: 1.1 ng/mL (ref 0.0–4.0)

## 2020-12-02 LAB — CMP14+EGFR
ALT: 9 IU/L (ref 0–44)
AST: 13 IU/L (ref 0–40)
Albumin/Globulin Ratio: 1.3 (ref 1.2–2.2)
Albumin: 4.2 g/dL (ref 3.8–4.8)
Alkaline Phosphatase: 58 IU/L (ref 44–121)
BUN/Creatinine Ratio: 11 (ref 10–24)
BUN: 12 mg/dL (ref 8–27)
Bilirubin Total: 0.7 mg/dL (ref 0.0–1.2)
CO2: 22 mmol/L (ref 20–29)
Calcium: 9.1 mg/dL (ref 8.6–10.2)
Chloride: 102 mmol/L (ref 96–106)
Creatinine, Ser: 1.08 mg/dL (ref 0.76–1.27)
Globulin, Total: 3.3 g/dL (ref 1.5–4.5)
Glucose: 145 mg/dL — ABNORMAL HIGH (ref 65–99)
Potassium: 4.2 mmol/L (ref 3.5–5.2)
Sodium: 137 mmol/L (ref 134–144)
Total Protein: 7.5 g/dL (ref 6.0–8.5)
eGFR: 77 mL/min/{1.73_m2} (ref >59)

## 2020-12-02 LAB — LIPID PANEL
Chol/HDL Ratio: 4.7 ratio (ref 0.0–5.0)
Cholesterol, Total: 170 mg/dL (ref 100–199)
HDL: 36 mg/dL — ABNORMAL LOW (ref >39)
LDL Chol Calc (NIH): 118 mg/dL — ABNORMAL HIGH (ref 0–99)
Triglycerides: 88 mg/dL (ref 0–149)
VLDL Cholesterol Cal: 16 mg/dL (ref 5–40)

## 2020-12-08 ENCOUNTER — Other Ambulatory Visit: Payer: Self-pay

## 2020-12-08 MED ORDER — ROSUVASTATIN CALCIUM 5 MG PO TABS: 5.0000 mg | ORAL_TABLET | Freq: Every day | ORAL | 1 refills | Status: DC

## 2020-12-26 ENCOUNTER — Telehealth: Payer: Self-pay | Admitting: Family Medicine

## 2020-12-26 NOTE — Telephone Encounter (Signed)
Spoke with patient who reports he was at home laying down yesterday when suddenly he became very short of breath and was having a problem breathing, he explains he started to see "the brightest white light" I have ever seen".  And then it went away and he could breathe fine.  He has not had any SOB since.  Wants to be seen, scheduled appointment with Dr. Livia Snellen tomorrow at 9:10 am.

## 2020-12-27 ENCOUNTER — Encounter: Payer: Self-pay | Admitting: Family Medicine

## 2020-12-27 ENCOUNTER — Ambulatory Visit (INDEPENDENT_AMBULATORY_CARE_PROVIDER_SITE_OTHER): Payer: BC Managed Care – PPO

## 2020-12-27 ENCOUNTER — Other Ambulatory Visit: Payer: Self-pay

## 2020-12-27 ENCOUNTER — Ambulatory Visit (INDEPENDENT_AMBULATORY_CARE_PROVIDER_SITE_OTHER): Payer: BC Managed Care – PPO | Admitting: Family Medicine

## 2020-12-27 VITALS — BP 125/72 | HR 87 | Temp 98.2°F | Ht 73.0 in | Wt 241.0 lb

## 2020-12-27 DIAGNOSIS — J449 Chronic obstructive pulmonary disease, unspecified: Secondary | ICD-10-CM | POA: Diagnosis not present

## 2020-12-27 DIAGNOSIS — R0681 Apnea, not elsewhere classified: Secondary | ICD-10-CM

## 2020-12-27 DIAGNOSIS — R059 Cough, unspecified: Secondary | ICD-10-CM | POA: Diagnosis not present

## 2020-12-27 DIAGNOSIS — R0600 Dyspnea, unspecified: Secondary | ICD-10-CM | POA: Diagnosis not present

## 2020-12-27 NOTE — Progress Notes (Signed)
Your chest x-ray looked normal. Thanks, WS.

## 2020-12-27 NOTE — Progress Notes (Signed)
Subjective:  [name], male    DOB: 30-Mar-1956  Age: 65 y.o. MRN: [id number]  CC: No chief complaint on file.   HPI Eric Key presents for awoke at 1:30 AM 2 days ago. Awoke. Couldn't breathe. Tried t get a breathe and couldn't with 5 attempts. Saw a bright light, then suddenly got a  breath and the light went away.  No previous occurrence.  No recurrence since then either.  He has been told that he snores a lot.  He generally sleeps on his back or side but awoke laying on his abdomen.  Depression screen [facility] 2/9 12/27/2020 12/01/2020 11/14/2020  Decreased Interest 0 0 0  Down, Depressed, Hopeless 0 0 0  PHQ - 2 Score 0 0 0    History Reg has a past medical history of Cataract, History of adenomatous polyps of colon (03/16/2020), Hypertension, Hypertensive retinopathy, and Macular degeneration.   He has a past surgical history that includes Gallbladder surgery; Achilles tendon repair (2010); Cholecystectomy; and Colonoscopy.   His family history includes Diabetes in his brother and maternal grandfather; Heart disease in his brother.He reports that he quit smoking about 10 months ago. His smoking use included cigarettes. He has never used smokeless tobacco. He reports current alcohol use. He reports that he does not use drugs.    ROS Review of Systems  Constitutional:  Negative for fever.  Respiratory:  Negative for shortness of breath.   Cardiovascular:  Negative for chest pain.  Musculoskeletal:  Negative for arthralgias.  Skin:  Negative for rash.   Objective:  BP 125/72   Pulse 87   Temp 98.2 F (36.8 C)   Ht '6\' 1"'$  (1.854 m)   Wt 241 lb (109.3 kg)   SpO2 93%   BMI 31.80 kg/m   BP Readings from Last 3 Encounters:  12/27/20 125/72  12/01/20 134/88  11/14/20 138/84    Wt Readings from Last 3 Encounters:  12/27/20 241 lb (109.3 kg)  12/01/20 241 lb (109.3 kg)  11/14/20 246 lb (111.6 kg)     Physical Exam Vitals reviewed.   Constitutional:      Appearance: He is well-developed.  HENT:     Head: Normocephalic and atraumatic.     Right Ear: External ear normal.     Left Ear: External ear normal.     Mouth/Throat:     Pharynx: No oropharyngeal exudate or posterior oropharyngeal erythema.  Eyes:     Pupils: Pupils are equal, round, and reactive to light.  Cardiovascular:     Rate and Rhythm: Normal rate and regular rhythm.     Heart sounds: No murmur heard. Pulmonary:     Effort: No respiratory distress.     Breath sounds: Normal breath sounds.  Musculoskeletal:     Cervical back: Normal range of motion and neck supple.  Neurological:     Mental Status: He is alert and oriented to person, place, and time.     EKG shows normal sinus rhythm without ischemic changes normal axes. Assessment & Plan:   Diagnoses and all orders for this visit:  Apnea -     EKG 12-Lead -     DG Chest 2 View; Future -     Ambulatory referral to Sleep Studies   I am having Jori Moll E. [place] maintain his rosuvastatin.  Allergies as of 12/27/2020       Reactions   Penicillins Hives        Medication List  Accurate as of December 27, 2020  9:49 PM. If you have any questions, ask your nurse or doctor.          rosuvastatin 5 MG tablet Commonly known as: Crestor Take 1 tablet (5 mg total) by mouth daily.         Follow-up: Return if symptoms worsen or fail to improve.  Claretta Fraise, M.D.

## 2020-12-29 NOTE — Progress Notes (Signed)
[facility] Note  01/04/2021     CHIEF COMPLAINT Patient presents for Retina Follow Up   HISTORY OF PRESENT ILLNESS: Eric Key is a 65 y.o. male who presents to the clinic today for:   HPI     Retina Follow Up   Patient presents with  Wet AMD.  In left eye.  Duration of 5 weeks.  Since onset it is stable.  I, the attending physician,  performed the HPI with the patient and updated documentation appropriately.        Comments   5 week follow up Exu ARMD OS-  Vision appears stable OU, no new problems.       Last edited by [name], MD on 01/04/2021  8:47 AM.     Pt states no change in vision  Referring physician: [name], [name], MD [place],  [place] [id number]  HISTORICAL INFORMATION:   Selected notes from the MEDICAL RECORD NUMBER Referred by Dr. [name] for concern of mac heme OS.   CURRENT MEDICATIONS: No current outpatient medications on file. (Ophthalmic Drugs)   No current facility-administered medications for this visit. (Ophthalmic Drugs)   Current Outpatient Medications (Other)  Medication Sig   rosuvastatin (CRESTOR) 5 MG tablet Take 1 tablet (5 mg total) by mouth daily.   No current facility-administered medications for this visit. (Other)   REVIEW OF SYSTEMS: ROS   Positive for: Genitourinary, Eyes Negative for: Constitutional, Gastrointestinal, Neurological, Skin, Musculoskeletal, HENT, Endocrine, Cardiovascular, Respiratory, Psychiatric, Allergic/Imm, Heme/Lymph Last edited by Leonie Douglas, COA on 01/04/2021  7:45 AM.      ALLERGIES Allergies  Allergen Reactions   Penicillins Hives    PAST MEDICAL HISTORY Past Medical History:  Diagnosis Date   Cataract    Mixed form OU   History of adenomatous polyps of colon 03/16/2020   Hypertension    past hx    Hypertensive retinopathy    OU   Macular degeneration    Dry OD; Wet OS   Past Surgical History:  Procedure Laterality Date    ACHILLES TENDON REPAIR  2010   CHOLECYSTECTOMY     COLONOSCOPY     GALLBLADDER SURGERY      FAMILY HISTORY Family History  Problem Relation Age of Onset   Diabetes Brother    Heart disease Brother    Diabetes Maternal Grandfather    Colon polyps Neg Hx    Colon cancer Neg Hx    Esophageal cancer Neg Hx    Rectal cancer Neg Hx    Stomach cancer Neg Hx     SOCIAL HISTORY Social History   Tobacco Use   Smoking status: Former    Types: Cigarettes    Quit date: 02/05/2020    Years since quitting: 0.9   Smokeless tobacco: Never  Vaping Use   Vaping Use: Never used  Substance Use Topics   Alcohol use: Yes    Comment: occ   Drug use: Never         OPHTHALMIC EXAM:  Base Eye Exam     Visual Acuity (Snellen - Linear)       Right Left   Dist cc 20/20 20/30 +1   Dist ph cc  20/30 +2    Correction: Glasses         Tonometry (Tonopen, 7:49 AM)       Right Left   Pressure 13 10         Pupils  Dark Light Shape React APD   Right 3 2 Round Brisk None   Left 3 2 Round Brisk None         Visual Fields (Counting fingers)       Left Right    Full Full         Extraocular Movement       Right Left    Full Full         Neuro/Psych     Oriented x3: Yes   Mood/Affect: Normal         Dilation     Both eyes: 1.0% Mydriacyl, 2.5% Phenylephrine @ 7:49 AM           Slit Lamp and Fundus Exam     Slit Lamp Exam       Right Left   Lids/Lashes Dermatochalasis - upper lid Dermatochalasis - upper lid   Conjunctiva/Sclera White and quiet White and quiet   Cornea 1-2+ PEE, mild Arcus, mild Debris in tear film mild Arcus, mild Debris in tear film, 3+ Punctate epithelial erosions, decreased TBUT   Anterior Chamber deep, clear, narrow angles deep, clear, narrow angles   Iris Round and moderately dilated, mild anterior bowing Round and moderately dilated, mild anterior bowing   Lens 2+ Nuclear sclerosis, 2+ Cortical cataract 2+ Nuclear  sclerosis, 2+ Cortical cataract   Vitreous Vitreous syneresis Vitreous syneresis         Fundus Exam       Right Left   Disc Pink and Sharp, focal PPP ST, Compact Pink and Sharp, Compact   C/D Ratio 0.3 0.3   Macula Flat, Drusen, RPE mottling and clumping, early Atrophy, punctate IRH -- persistent Abnormal foveal reflex, +CNV with heme improved, +central edema -- slightly increased, Drusen, RPE mottling and clumping   Vessels attenuated, mild tortuousity attenuated, mild tortuousity   Periphery Attached, peripheral pigmented CR scarring and perivascular bone spicules 360 Attached, peripheral pigmented CR scarring and perivascular bone spicules 360           Refraction     Wearing Rx       Sphere Cylinder Axis Add   Right [place] +1.25 177 +2.25   Left [place] +0.75 170 +2.25            IMAGING AND PROCEDURES  Imaging and Procedures for _0 @  OCT, Retina - OU - Both Eyes       Right Eye Quality was good. Central Foveal Thickness: 308. Progression has been stable. Findings include normal foveal contour, no IRF, no SRF, retinal drusen , outer retinal atrophy, pigment epithelial detachment (Persistent vitreous opacities; stable low lying PEDs).   Left Eye Quality was good. Central Foveal Thickness: 351. Progression has worsened. Findings include abnormal foveal contour, intraretinal fluid, subretinal hyper-reflective material, pigment epithelial detachment, outer retinal atrophy, retinal drusen (persistent IRF temporal fovea and macula, mild interval increase in central cyst, +persistent vitreous opacities).   Notes *Images captured and stored on drive  Diagnosis / Impression:  OD: non-exu ARMD  OS: exu ARMD -- persistent IRF temporal fovea and macula, mild interval increase in central cyst, +persistent vitreous opacities  Clinical management:  See below  Abbreviations: NFP - Normal foveal profile. CME - cystoid macular edema. PED - pigment epithelial detachment.  IRF - intraretinal fluid. SRF - subretinal fluid. EZ - ellipsoid zone. ERM - epiretinal membrane. ORA - outer retinal atrophy. ORT - outer retinal tubulation. SRHM - subretinal hyper-reflective material      Intravitreal Injection, Pharmacologic  Agent - OS - Left Eye       Time Out 01/04/2021. 8:28 AM. Confirmed correct patient, procedure, site, and patient consented.   Anesthesia Topical anesthesia was used. Anesthetic medications included Lidocaine 2%, Proparacaine 0.5%.   Procedure Preparation included 5% betadine to ocular surface, eyelid speculum. A (32g) needle was used.   Injection: 2 mg aflibercept 2 MG/0.05ML   Route: Intravitreal, Site: Left Eye   NDC: [id number], Lot: [id number], Expiration date: 10/13/2021, Waste: 0.05 mL   Post-op Post injection exam found visual acuity of at least counting fingers. The patient tolerated the procedure well. There were no complications. The patient received written and verbal post procedure care education. Post injection medications were not given.             ASSESSMENT/PLAN:    ICD-10-CM   1. Exudative age-related macular degeneration of left eye with active choroidal neovascularization (HCC)  H35.3221 Intravitreal Injection, Pharmacologic Agent - OS - Left Eye    aflibercept (EYLEA) SOLN 2 mg    2. Retinal edema  H35.81 OCT, Retina - OU - Both Eyes    3. 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 Essential hypertension  I10     6. Hypertensive retinopathy of both eyes  H35.033     7. Combined forms of age-related cataract of both eyes  H25.813      1,2. 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, OS  - s/p IVA OS #1 (03.04.21), #2 (03.04.21), #3 (04.30.21) -- IVA resistance  - s/p IVE OS #1 (06.04.21), #2 (07.08.21), #3 (08.09.21), #4 (09.13.21), #5 (10.18.21), #6 (11.24.21), #7 (12.31.21), #8 (02.11.22), #9 (03.25.22), #10  (05.02.22), #11 (06.06.22), #12 (07.13.22), #13 (08.17.22)  - pt reports stable vision OS  - exam shows central CNV with +subretinal heme -- improving  - BCVA 20/30 OS (stable)  - OCT shows persistent IRF temporal fovea and macula, mild interval increase in central cyst at 5 wks  - recommend IVE OS #14 today, 09.21.22, w/ f/u in 5 wks  - pt wishes to proceed  - RBA of procedure discussed, questions answered  - informed consent obtained and signed  - see procedure note  - IVA consent form signed and scanned, 03.04.21  - IVE consent form signed and scanned, 06.04.21  - Eylea[employer]U benefits investigation started, 04.30.21 -- approved as of 06.04.21  - f/u in 5 wks -- DFE/OCT, possible injection  3. Age related macular degeneration, non-exudative, OD  - The incidence, anatomy, and pathology of dry AMD, risk of progression, and the AREDS and AREDS 2 study including smoking risks discussed with patient.  - recommend Amsler grid monitoring  4. Pigmentary retinopathy OU  - peripheral pigmented CR scarring and perivascular pigment clumping / bone spicules OU  - VA remains fairly good (20/20 OD, 20/40 OS with exudative ARMD OS)  - denies night blindness  - denies family history of vision problems  - discussed findings  - broad differential for pigmentary retinopathy:  Congenital (RP), infectious (TB, lyme dx, syphilis, toxo, bartonella), autoimmune and toxic etiologies  - labs checked (4.2.21):  All essential WNL   Rheumatoid factor   CBC   Comprehensive metabolic panel     RPR   ANA   ESR, CRP   ACE   ANCA   Toxoplasma antibodies IgG / IgM   Quantiferon - TB Gold Plus   HLA, A, B, C (IR)  Fluorescent treponemal ab (fla) - IgG - bld   VDRL Serum   Bartonella antibody, 06.04.21  - [facility] -- drawn on 6.4.21                         Results Positive for AGBL5, ALMS1, OPA1 -- unknown significance             - f/u in 4-5 weeks  5,6. Hypertensive retinopathy OU  - discussed  importance of tight BP control  - monitor  7. Mixed form age related cataracts OU  - The symptoms of cataract, surgical options, and treatments and risks were discussed with patient.  - discussed diagnosis and progression  - not yet visually significant  - monitor for now  Ophthalmic Meds Ordered this visit:  Meds ordered this encounter  Medications   aflibercept (EYLEA) SOLN 2 mg       Return in about 5 weeks (around 02/08/2021) for f/u exu ARMD OS, DFE, OCT.  There are no Patient Instructions on file for this visit.  This document serves as a record of services personally performed by Gardiner Sleeper, MD, PhD. It was created on their behalf by Roselee Nova, COMT. The creation of this record is the provider's dictation and/or activities during the visit.  Electronically signed by: Roselee Nova, COMT 01/04/21 8:53 AM  This document serves as a record of services personally performed by Gardiner Sleeper, MD, PhD. It was created on their behalf by San Jetty. Owens Shark, OA an ophthalmic technician. The creation of this record is the provider's dictation and/or activities during the visit.    Electronically signed by: San Jetty. Owens Shark, [place] 09.21.2022 8:53 AM   Gardiner Sleeper, M.D., Ph.D. Diseases & Surgery of the Retina and Vitreous [facility]  I have reviewed the above documentation for accuracy and completeness, and I agree with the above. Gardiner Sleeper, M.D., Ph.D. 01/04/21 8:53 AM   Abbreviations: M myopia (nearsighted); A astigmatism; H hyperopia (farsighted); P presbyopia; Mrx spectacle prescription;  CTL contact lenses; OD right eye; OS left eye; OU both eyes  XT exotropia; ET esotropia; PEK punctate epithelial keratitis; PEE punctate epithelial erosions; DES dry eye syndrome; MGD meibomian gland dysfunction; ATs artificial tears; PFAT's preservative free artificial tears; [place] nuclear sclerotic cataract; PSC posterior subcapsular cataract; ERM epi-retinal  membrane; PVD posterior vitreous detachment; RD retinal detachment; DM diabetes mellitus; DR diabetic retinopathy; NPDR non-proliferative diabetic retinopathy; PDR proliferative diabetic retinopathy; CSME clinically significant macular edema; DME diabetic macular edema; dbh dot blot hemorrhages; CWS cotton wool spot; POAG primary open angle glaucoma; C/D cup-to-disc ratio; HVF humphrey visual field; GVF goldmann visual field; OCT optical coherence tomography; IOP intraocular pressure; BRVO Branch retinal vein occlusion; CRVO central retinal vein occlusion; CRAO central retinal artery occlusion; BRAO branch retinal artery occlusion; RT retinal tear; SB scleral buckle; PPV pars plana vitrectomy; VH Vitreous hemorrhage; PRP panretinal laser photocoagulation; IVK intravitreal kenalog; VMT vitreomacular traction; MH Macular hole;  NVD neovascularization of the disc; NVE neovascularization elsewhere; AREDS age related eye disease study; ARMD age related macular degeneration; POAG primary open angle glaucoma; EBMD epithelial/anterior basement membrane dystrophy; ACIOL anterior chamber intraocular lens; IOL intraocular lens; PCIOL posterior chamber intraocular lens; Phaco/IOL phacoemulsification with intraocular lens placement; [place] photorefractive keratectomy; LASIK laser assisted in situ keratomileusis; HTN hypertension; DM diabetes mellitus; COPD chronic obstructive pulmonary disease

## 2021-01-04 ENCOUNTER — Other Ambulatory Visit: Payer: Self-pay

## 2021-01-04 ENCOUNTER — Encounter (INDEPENDENT_AMBULATORY_CARE_PROVIDER_SITE_OTHER): Payer: Self-pay | Admitting: Ophthalmology

## 2021-01-04 ENCOUNTER — Ambulatory Visit (INDEPENDENT_AMBULATORY_CARE_PROVIDER_SITE_OTHER): Payer: BC Managed Care – PPO | Admitting: Ophthalmology

## 2021-01-04 DIAGNOSIS — H353221 Exudative age-related macular degeneration, left eye, with active choroidal neovascularization: Secondary | ICD-10-CM | POA: Diagnosis not present

## 2021-01-04 DIAGNOSIS — H25813 Combined forms of age-related cataract, bilateral: Secondary | ICD-10-CM

## 2021-01-04 DIAGNOSIS — H35033 Hypertensive retinopathy, bilateral: Secondary | ICD-10-CM

## 2021-01-04 DIAGNOSIS — H3552 Pigmentary retinal dystrophy: Secondary | ICD-10-CM | POA: Diagnosis not present

## 2021-01-04 DIAGNOSIS — I10 Essential (primary) hypertension: Secondary | ICD-10-CM | POA: Diagnosis not present

## 2021-01-04 DIAGNOSIS — H3581 Retinal edema: Secondary | ICD-10-CM

## 2021-01-04 DIAGNOSIS — H353112 Nonexudative age-related macular degeneration, right eye, intermediate dry stage: Secondary | ICD-10-CM

## 2021-01-04 MED ORDER — AFLIBERCEPT 2MG/0.05ML IZ SOLN FOR KALEIDOSCOPE
2.0000 mg | INTRAVITREAL | Status: AC | PRN
  Administered 2021-01-04: 2 mg via INTRAVITREAL

## 2021-02-01 NOTE — Progress Notes (Signed)
[facility] Note  02/08/2021     CHIEF COMPLAINT Patient presents for Retina Follow Up   HISTORY OF PRESENT ILLNESS: Eric Key is a 65 y.o. male who presents to the clinic today for:   HPI     Retina Follow Up   Patient presents with  Wet AMD.  In left eye.  Severity is moderate.  Duration of 5 weeks.  Since onset it is stable.  I, the attending physician,  performed the HPI with the patient and updated documentation appropriately.        Comments   Pt here for 5wk ret f/u for exu ARMD OS. Pt states no vision changes or issues. No ocular pain or discomfort.       Last edited by [name], MD on 02/08/2021  8:37 AM.    Pt states vision is stable  Referring physician: [name], [name], MD [place],  [place] [id number]  HISTORICAL INFORMATION:   Selected notes from the MEDICAL RECORD NUMBER Referred by Dr. [name] for concern of mac heme OS.   CURRENT MEDICATIONS: No current outpatient medications on file. (Ophthalmic Drugs)   No current facility-administered medications for this visit. (Ophthalmic Drugs)   Current Outpatient Medications (Other)  Medication Sig   rosuvastatin (CRESTOR) 5 MG tablet Take 1 tablet (5 mg total) by mouth daily.   No current facility-administered medications for this visit. (Other)   REVIEW OF SYSTEMS: ROS   Positive for: Genitourinary, Eyes Negative for: Constitutional, Gastrointestinal, Neurological, Skin, Musculoskeletal, HENT, Endocrine, Cardiovascular, Respiratory, Psychiatric, Allergic/Imm, Heme/Lymph Last edited by [name], COT on 02/08/2021  7:41 AM.     ALLERGIES Allergies  Allergen Reactions   Penicillins Hives   PAST MEDICAL HISTORY Past Medical History:  Diagnosis Date   Cataract    Mixed form OU   History of adenomatous polyps of colon 03/16/2020   Hypertension    past hx    Hypertensive retinopathy    OU   Macular degeneration    Dry OD; Wet  OS   Past Surgical History:  Procedure Laterality Date   ACHILLES TENDON REPAIR  2010   CHOLECYSTECTOMY     COLONOSCOPY     GALLBLADDER SURGERY      FAMILY HISTORY Family History  Problem Relation Age of Onset   Diabetes Brother    Heart disease Brother    Diabetes Maternal Grandfather    Colon polyps Neg Hx    Colon cancer Neg Hx    Esophageal cancer Neg Hx    Rectal cancer Neg Hx    Stomach cancer Neg Hx    SOCIAL HISTORY Social History   Tobacco Use   Smoking status: Former    Types: Cigarettes    Quit date: 02/05/2020    Years since quitting: 1.0   Smokeless tobacco: Never  Vaping Use   Vaping Use: Never used  Substance Use Topics   Alcohol use: Yes    Comment: occ   Drug use: Never       OPHTHALMIC EXAM: Base Eye Exam     Visual Acuity (Snellen - Linear)       Right Left   Dist cc 20/20 -1 20/30   Dist ph cc  NI    Correction: Glasses         Tonometry (Tonopen, 7:49 AM)       Right Left   Pressure 11 12  Pupils       Dark Light Shape React APD   Right 3 2 Round Brisk None   Left 3 2 Round Brisk None         Visual Fields (Counting fingers)       Left Right    Full Full         Extraocular Movement       Right Left    Full, Ortho Full, Ortho         Neuro/Psych     Oriented x3: Yes   Mood/Affect: Normal         Dilation     Both eyes: 1.0% Mydriacyl, 2.5% Phenylephrine @ 7:49 AM           Slit Lamp and Fundus Exam     Slit Lamp Exam       Right Left   Lids/Lashes Dermatochalasis - upper lid Dermatochalasis - upper lid   Conjunctiva/Sclera White and quiet White and quiet   Cornea trace PEE, mild Arcus, mild Debris in tear film mild Arcus, mild Debris in tear film, 2-3+ Punctate epithelial erosions, decreased TBUT   Anterior Chamber deep, clear, narrow angles deep, clear, narrow angles   Iris Round and moderately dilated, mild anterior bowing Round and moderately dilated, mild anterior bowing    Lens 2+ Nuclear sclerosis, 2+ Cortical cataract 2+ Nuclear sclerosis, 2+ Cortical cataract   Vitreous Vitreous syneresis Vitreous syneresis         Fundus Exam       Right Left   Disc Pink and Sharp, focal PPP ST, Compact Pink and Sharp, Compact   C/D Ratio 0.2 0.3   Macula Flat, Drusen, RPE mottling and clumping, early Atrophy, punctate IRH -- improved Abnormal foveal reflex, +CNV with heme improved, +central edema -- slightly improved, Drusen, RPE mottling and clumping   Vessels attenuated, mild tortuousity, perivascular pigment clumping peripherally attenuated, mild tortuousity, perivascular pigment clumping peripherally   Periphery Attached, peripheral pigmented CR scarring and perivascular bone spicules 360 Attached, peripheral pigmented CR scarring and perivascular bone spicules 360           Refraction     Wearing Rx       Sphere Cylinder Axis Add   Right [place] +1.25 177 +2.25   Left [place] +0.75 170 +2.25           IMAGING AND PROCEDURES  Imaging and Procedures for _0 @  OCT, Retina - OU - Both Eyes       Right Eye Quality was good. Central Foveal Thickness: 305. Progression has been stable. Findings include normal foveal contour, no IRF, no SRF, retinal drusen , outer retinal atrophy, pigment epithelial detachment (Persistent vitreous opacities; stable low lying PEDs).   Left Eye Quality was borderline. Central Foveal Thickness: 330. Progression has improved. Findings include abnormal foveal contour, intraretinal fluid, subretinal hyper-reflective material, pigment epithelial detachment, outer retinal atrophy, retinal drusen (Interval decrease in IRF central and temporal macula).   Notes *Images captured and stored on drive  Diagnosis / Impression:  OD: non-exu ARMD  OS: exu ARMD -- Interval decrease in IRF central and temporal macula Clinical management:  See below  Abbreviations: NFP - Normal foveal profile. CME - cystoid macular edema. PED - pigment  epithelial detachment. IRF - intraretinal fluid. SRF - subretinal fluid. EZ - ellipsoid zone. ERM - epiretinal membrane. ORA - outer retinal atrophy. ORT - outer retinal tubulation. SRHM - subretinal hyper-reflective material      Intravitreal Injection,  Pharmacologic Agent - OS - Left Eye       Time Out 02/08/2021. 7:48 AM. Confirmed correct patient, procedure, site, and patient consented.   Anesthesia Topical anesthesia was used. Anesthetic medications included Lidocaine 2%, Proparacaine 0.5%.   Procedure Preparation included 5% betadine to ocular surface, eyelid speculum. A (32g) needle was used.   Injection: 2 mg aflibercept 2 MG/0.05ML   Route: Intravitreal, Site: Left Eye   NDC: [id number], Lot: [id number], Expiration date: 12/13/2021, Waste: 0.05 mL   Post-op Post injection exam found visual acuity of at least counting fingers. The patient tolerated the procedure well. There were no complications. The patient received written and verbal post procedure care education. Post injection medications were not given.            ASSESSMENT/PLAN:   ICD-10-CM   1. Exudative age-related macular degeneration of left eye with active choroidal neovascularization (HCC)  H35.3221 Intravitreal Injection, Pharmacologic Agent - OS - Left Eye    aflibercept (EYLEA) SOLN 2 mg    2. Retinal edema  H35.81 OCT, Retina - OU - Both Eyes    3. 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 Essential hypertension  I10     6. Hypertensive retinopathy of both eyes  H35.033     7. Combined forms of age-related cataract of both eyes  H25.813      1,2. 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, OS  - s/p IVA OS #1 (03.04.21), #2 (03.04.21), #3 (04.30.21) -- IVA resistance  - s/p IVE OS #1 (06.04.21), #2 (07.08.21), #3 (08.09.21), #4 (09.13.21), #5 (10.18.21), #6 (11.24.21), #7 (12.31.21), #8 (02.11.22), #9  (03.25.22), #10 (05.02.22), #11 (06.06.22), #12 (07.13.22), #13 (08.17.22), #14 (09.21.22)  - pt reports stable vision OS  - exam shows central CNV with +subretinal heme -- improving  - BCVA 20/30 OS (stable)  - OCT shows Interval decrease in IRF central and temporal macula at 5 wks  - recommend IVE OS #15 today, 10.26.22, w/ f/u in 5 wks  - pt wishes to proceed  - RBA of procedure discussed, questions answered  - informed consent obtained and signed  - see procedure note  - IVA consent form signed and scanned, 03.04.21  - IVE consent form signed and scanned, 06.04.21  - Eylea[employer]U benefits investigation started, 04.30.21 -- approved as of 06.04.21  - f/u in 5 wks -- DFE/OCT, possible injection  3. Age related macular degeneration, non-exudative, OD  - The incidence, anatomy, and pathology of dry AMD, risk of progression, and the AREDS and AREDS 2 study including smoking risks discussed with patient.  - recommend Amsler grid monitoring  4. Pigmentary retinopathy OU  - peripheral pigmented CR scarring and perivascular pigment clumping / bone spicules OU  - VA remains fairly good (20/20 OD, 20/40 OS with exudative ARMD OS)  - denies night blindness  - denies family history of vision problems  - discussed findings  - broad differential for pigmentary retinopathy:  Congenital (RP), infectious (TB, lyme dx, syphilis, toxo, bartonella), autoimmune and toxic etiologies  - labs checked (4.2.21):  All essential WNL   Rheumatoid factor   CBC   Comprehensive metabolic panel     RPR   ANA   ESR, CRP   ACE   ANCA   Toxoplasma antibodies IgG / IgM   Quantiferon - TB Gold Plus   HLA, A, B, C (IR)   Fluorescent  treponemal ab (fla) - IgG - bld   VDRL Serum   Bartonella antibody, 06.04.21  - [facility] -- drawn on 6.4.21                         Results Positive for AGBL5, ALMS1, OPA1 -- unknown significance             - f/u in 4-5 weeks  5,6. Hypertensive retinopathy OU  -  discussed importance of tight BP control  - monitor  7. Mixed form age related cataracts OU  - The symptoms of cataract, surgical options, and treatments and risks were discussed with patient.  - discussed diagnosis and progression  - not yet visually significant  - monitor for now  Ophthalmic Meds Ordered this visit:  Meds ordered this encounter  Medications   aflibercept (EYLEA) SOLN 2 mg     Return in about 5 weeks (around 03/15/2021) for f/u exu ARMD OS, DFE, OCT.  There are no Patient Instructions on file for this visit.  This document serves as a record of services personally performed by Gardiner Sleeper, MD, PhD. It was created on their behalf by Orvan Falconer, an ophthalmic technician. The creation of this record is the provider's dictation and/or activities during the visit.    Electronically signed by: Orvan Falconer, OA, 02/08/21  8:40 AM  This document serves as a record of services personally performed by Gardiner Sleeper, MD, PhD. It was created on their behalf by San Jetty. Owens Shark, OA an ophthalmic technician. The creation of this record is the provider's dictation and/or activities during the visit.    Electronically signed by: San Jetty. Owens Shark, [place] 10.26.2022 8:40 AM  Gardiner Sleeper, M.D., Ph.D. Diseases & Surgery of the Retina and Vitreous [facility]  I have reviewed the above documentation for accuracy and completeness, and I agree with the above. Gardiner Sleeper, M.D., Ph.D. 02/08/21 8:44 AM  Abbreviations: M myopia (nearsighted); A astigmatism; H hyperopia (farsighted); P presbyopia; Mrx spectacle prescription;  CTL contact lenses; OD right eye; OS left eye; OU both eyes  XT exotropia; ET esotropia; PEK punctate epithelial keratitis; PEE punctate epithelial erosions; DES dry eye syndrome; MGD meibomian gland dysfunction; ATs artificial tears; PFAT's preservative free artificial tears; [place] nuclear sclerotic cataract; PSC posterior  subcapsular cataract; ERM epi-retinal membrane; PVD posterior vitreous detachment; RD retinal detachment; DM diabetes mellitus; DR diabetic retinopathy; NPDR non-proliferative diabetic retinopathy; PDR proliferative diabetic retinopathy; CSME clinically significant macular edema; DME diabetic macular edema; dbh dot blot hemorrhages; CWS cotton wool spot; POAG primary open angle glaucoma; C/D cup-to-disc ratio; HVF humphrey visual field; GVF goldmann visual field; OCT optical coherence tomography; IOP intraocular pressure; BRVO Branch retinal vein occlusion; CRVO central retinal vein occlusion; CRAO central retinal artery occlusion; BRAO branch retinal artery occlusion; RT retinal tear; SB scleral buckle; PPV pars plana vitrectomy; VH Vitreous hemorrhage; PRP panretinal laser photocoagulation; IVK intravitreal kenalog; VMT vitreomacular traction; MH Macular hole;  NVD neovascularization of the disc; NVE neovascularization elsewhere; AREDS age related eye disease study; ARMD age related macular degeneration; POAG primary open angle glaucoma; EBMD epithelial/anterior basement membrane dystrophy; ACIOL anterior chamber intraocular lens; IOL intraocular lens; PCIOL posterior chamber intraocular lens; Phaco/IOL phacoemulsification with intraocular lens placement; [place] photorefractive keratectomy; LASIK laser assisted in situ keratomileusis; HTN hypertension; DM diabetes mellitus; COPD chronic obstructive pulmonary disease

## 2021-02-03 ENCOUNTER — Other Ambulatory Visit: Payer: Self-pay

## 2021-02-03 ENCOUNTER — Ambulatory Visit (INDEPENDENT_AMBULATORY_CARE_PROVIDER_SITE_OTHER): Payer: BC Managed Care – PPO | Admitting: *Deleted

## 2021-02-03 DIAGNOSIS — Z23 Encounter for immunization: Secondary | ICD-10-CM

## 2021-02-08 ENCOUNTER — Other Ambulatory Visit: Payer: Self-pay

## 2021-02-08 ENCOUNTER — Encounter (INDEPENDENT_AMBULATORY_CARE_PROVIDER_SITE_OTHER): Payer: Self-pay | Admitting: Ophthalmology

## 2021-02-08 ENCOUNTER — Ambulatory Visit (INDEPENDENT_AMBULATORY_CARE_PROVIDER_SITE_OTHER): Payer: BC Managed Care – PPO | Admitting: Ophthalmology

## 2021-02-08 DIAGNOSIS — I10 Essential (primary) hypertension: Secondary | ICD-10-CM | POA: Diagnosis not present

## 2021-02-08 DIAGNOSIS — H353112 Nonexudative age-related macular degeneration, right eye, intermediate dry stage: Secondary | ICD-10-CM | POA: Diagnosis not present

## 2021-02-08 DIAGNOSIS — H3552 Pigmentary retinal dystrophy: Secondary | ICD-10-CM | POA: Diagnosis not present

## 2021-02-08 DIAGNOSIS — H353221 Exudative age-related macular degeneration, left eye, with active choroidal neovascularization: Secondary | ICD-10-CM | POA: Diagnosis not present

## 2021-02-08 DIAGNOSIS — H35033 Hypertensive retinopathy, bilateral: Secondary | ICD-10-CM

## 2021-02-08 DIAGNOSIS — H3581 Retinal edema: Secondary | ICD-10-CM

## 2021-02-08 DIAGNOSIS — H25813 Combined forms of age-related cataract, bilateral: Secondary | ICD-10-CM

## 2021-02-08 MED ORDER — AFLIBERCEPT 2MG/0.05ML IZ SOLN FOR KALEIDOSCOPE
2.0000 mg | INTRAVITREAL | Status: AC | PRN
  Administered 2021-02-08: 2 mg via INTRAVITREAL

## 2021-02-13 ENCOUNTER — Ambulatory Visit: Payer: BC Managed Care – PPO | Admitting: Family Medicine

## 2021-03-14 ENCOUNTER — Encounter (INDEPENDENT_AMBULATORY_CARE_PROVIDER_SITE_OTHER): Payer: Self-pay | Admitting: Ophthalmology

## 2021-03-14 ENCOUNTER — Ambulatory Visit (INDEPENDENT_AMBULATORY_CARE_PROVIDER_SITE_OTHER): Payer: BC Managed Care – PPO | Admitting: Ophthalmology

## 2021-03-14 ENCOUNTER — Other Ambulatory Visit: Payer: Self-pay

## 2021-03-14 DIAGNOSIS — H353221 Exudative age-related macular degeneration, left eye, with active choroidal neovascularization: Secondary | ICD-10-CM

## 2021-03-14 DIAGNOSIS — I10 Essential (primary) hypertension: Secondary | ICD-10-CM

## 2021-03-14 DIAGNOSIS — H35033 Hypertensive retinopathy, bilateral: Secondary | ICD-10-CM

## 2021-03-14 DIAGNOSIS — H353112 Nonexudative age-related macular degeneration, right eye, intermediate dry stage: Secondary | ICD-10-CM | POA: Diagnosis not present

## 2021-03-14 DIAGNOSIS — H3581 Retinal edema: Secondary | ICD-10-CM

## 2021-03-14 DIAGNOSIS — H3552 Pigmentary retinal dystrophy: Secondary | ICD-10-CM

## 2021-03-14 DIAGNOSIS — H25813 Combined forms of age-related cataract, bilateral: Secondary | ICD-10-CM

## 2021-03-14 MED ORDER — AFLIBERCEPT 2MG/0.05ML IZ SOLN FOR KALEIDOSCOPE
2.0000 mg | INTRAVITREAL | Status: AC | PRN
Start: 2021-03-14 — End: 2021-03-14
  Administered 2021-03-14: 2 mg via INTRAVITREAL

## 2021-03-14 NOTE — Progress Notes (Signed)
[facility] Note  03/14/2021     CHIEF COMPLAINT Patient presents for Retina Follow Up  HISTORY OF PRESENT ILLNESS: Eric Key is a 65 y.o. male who presents to the clinic today for:   HPI     Retina Follow Up   Patient presents with  Wet AMD.  In left eye.  This started years ago.  Severity is moderate.  Duration of 5 weeks.  Since onset it is stable.  I, the attending physician,  performed the HPI with the patient and updated documentation appropriately.        Comments   65 y/o male pt here for 5 wk f/u for exu ARMD OS.  No change in [place] OU.  Denies pain, FOL, floaters.  AT prn (rarely) OU.      Last edited by [name], MD on 03/14/2021 11:08 AM.      Referring physician: [name], [name], MD [place],  [place] [address]  HISTORICAL INFORMATION:   Selected notes from the MEDICAL RECORD NUMBER Referred by Dr. [name] for concern of mac heme OS.   CURRENT MEDICATIONS: No current outpatient medications on file. (Ophthalmic Drugs)   No current facility-administered medications for this visit. (Ophthalmic Drugs)   Current Outpatient Medications (Other)  Medication Sig   rosuvastatin (CRESTOR) 5 MG tablet Take 1 tablet (5 mg total) by mouth daily.   No current facility-administered medications for this visit. (Other)   REVIEW OF SYSTEMS: ROS   Positive for: Eyes Negative for: Constitutional, Gastrointestinal, Neurological, Skin, Genitourinary, Musculoskeletal, HENT, Endocrine, Cardiovascular, Respiratory, Psychiatric, Allergic/Imm, Heme/Lymph Last edited by Matthew Folks, COA on 03/14/2021  7:43 AM.      ALLERGIES Allergies  Allergen Reactions   Penicillins Hives   PAST MEDICAL HISTORY Past Medical History:  Diagnosis Date   Cataract    Mixed form OU   History of adenomatous polyps of colon 03/16/2020   Hypertension    past hx    Hypertensive retinopathy    OU   Macular degeneration    Dry  OD; Wet OS   Past Surgical History:  Procedure Laterality Date   ACHILLES TENDON REPAIR  2010   CHOLECYSTECTOMY     COLONOSCOPY     GALLBLADDER SURGERY      FAMILY HISTORY Family History  Problem Relation Age of Onset   Diabetes Brother    Heart disease Brother    Diabetes Maternal Grandfather    Colon polyps Neg Hx    Colon cancer Neg Hx    Esophageal cancer Neg Hx    Rectal cancer Neg Hx    Stomach cancer Neg Hx    SOCIAL HISTORY Social History   Tobacco Use   Smoking status: Former    Types: Cigarettes    Quit date: 02/05/2020    Years since quitting: 1.1   Smokeless tobacco: Never  Vaping Use   Vaping Use: Never used  Substance Use Topics   Alcohol use: Yes    Comment: occ   Drug use: Never       OPHTHALMIC EXAM: Base Eye Exam     Visual Acuity (Snellen - Linear)       Right Left   Dist cc 20/20 - 20/30 -2   Dist ph cc  20/25 -2    Correction: Glasses         Tonometry (Tonopen, 7:45 AM)       Right Left   Pressure  16 10         Pupils       Dark Light Shape React APD   Right 3 2 Round Brisk None   Left 3 2 Round Brisk None         Visual Fields (Counting fingers)       Left Right    Full Full         Extraocular Movement       Right Left    Full, Ortho Full, Ortho         Neuro/Psych     Oriented x3: Yes   Mood/Affect: Normal         Dilation     Both eyes: 1.0% Mydriacyl, 2.5% Phenylephrine @ 7:45 AM           Slit Lamp and Fundus Exam     Slit Lamp Exam       Right Left   Lids/Lashes Dermatochalasis - upper lid Dermatochalasis - upper lid   Conjunctiva/Sclera White and quiet White and quiet   Cornea trace PEE, mild Arcus, mild Debris in tear film mild Arcus, mild Debris in tear film, 2-3+ Punctate epithelial erosions, decreased TBUT   Anterior Chamber deep, clear, narrow angles deep, clear, narrow angles   Iris Round and moderately dilated, mild anterior bowing Round and moderately dilated, mild  anterior bowing   Lens 2+ Nuclear sclerosis, 2+ Cortical cataract 2+ Nuclear sclerosis, 2+ Cortical cataract   Anterior Vitreous Vitreous syneresis Vitreous syneresis         Fundus Exam       Right Left   Disc Pink and Sharp, focal PPP ST, Compact Pink and Sharp, Compact   C/D Ratio 0.2 0.3   Macula Flat, Drusen, RPE mottling and clumping, early Atrophy, punctate IRH -- improved Abnormal foveal reflex, +CNV with heme improved, +central edema -- slightly improved, Drusen, RPE mottling and clumping   Vessels attenuated, mild tortuousity, perivascular pigment clumping peripherally attenuated, mild tortuousity, perivascular pigment clumping peripherally   Periphery Attached, peripheral pigmented CR scarring and perivascular bone spicules 360 Attached, peripheral pigmented CR scarring and perivascular bone spicules 360           IMAGING AND PROCEDURES  Imaging and Procedures for _0 @  OCT, Retina - OU - Both Eyes       Right Eye Quality was good. Central Foveal Thickness: 305. Progression has been stable. Findings include normal foveal contour, no IRF, no SRF, retinal drusen , outer retinal atrophy, pigment epithelial detachment (Persistent vitreous opacities; stable low lying PEDs).   Left Eye Quality was borderline. Central Foveal Thickness: 330. Progression has improved. Findings include abnormal foveal contour, intraretinal fluid, subretinal hyper-reflective material, pigment epithelial detachment, outer retinal atrophy, retinal drusen (Interval improvement in IRF temporal macula).   Notes *Images captured and stored on drive  Diagnosis / Impression:  OD: non-exu ARMD  OS: exu ARMD -- Interval improvement in IRF temporal macula  Clinical management:  See below  Abbreviations: NFP - Normal foveal profile. CME - cystoid macular edema. PED - pigment epithelial detachment. IRF - intraretinal fluid. SRF - subretinal fluid. EZ - ellipsoid zone. ERM - epiretinal membrane. ORA -  outer retinal atrophy. ORT - outer retinal tubulation. SRHM - subretinal hyper-reflective material      Intravitreal Injection, Pharmacologic Agent - OS - Left Eye       Time Out 03/14/2021. 7:58 AM. Confirmed correct patient, procedure, site, and patient consented.   Anesthesia Topical anesthesia was used.  Anesthetic medications included Lidocaine 2%, Proparacaine 0.5%.   Procedure Preparation included 5% betadine to ocular surface, eyelid speculum. A (32g) needle was used.   Injection: 2 mg aflibercept 2 MG/0.05ML   Route: Intravitreal, Site: Left Eye   NDC: [id number], Lot: [id number], Expiration date: 01/13/2022, Waste: 0.05 mL   Post-op Post injection exam found visual acuity of at least counting fingers. The patient tolerated the procedure well. There were no complications. The patient received written and verbal post procedure care education. Post injection medications were not given.             ASSESSMENT/PLAN:   ICD-10-CM   1. Exudative age-related macular degeneration of left eye with active choroidal neovascularization (HCC)  H35.3221 Intravitreal Injection, Pharmacologic Agent - OS - Left Eye    aflibercept (EYLEA) SOLN 2 mg    2. Retinal edema  H35.81 OCT, Retina - OU - Both Eyes    3. 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 Essential hypertension  I10     6. Hypertensive retinopathy of both eyes  H35.033     7. Combined forms of age-related cataract of both eyes  H25.813       1,2. 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, OS  - s/p IVA OS #1 (03.04.21), #2 (03.04.21), #3 (04.30.21) -- IVA resistance  - s/p IVE OS #1 (06.04.21), #2 (07.08.21), #3 (08.09.21), #4 (09.13.21), #5 (10.18.21), #6 (11.24.21), #7 (12.31.21), #8 (02.11.22), #9 (03.25.22), #10 (05.02.22), #11 (06.06.22), #12 (07.13.22), #13 (08.17.22), #14 (09.21.22), #15 (10.26.22)  - pt reports  stable vision OS  - exam shows central CNV with +subretinal heme -- improving  - BCVA 20/30 OS (stable)  - OCT shows Interval decrease in IRF central and temporal macula at 5 wks  - recommend IVE OS #16 today, 11.29.22, w/ f/u in 5 wks  - pt wishes to proceed  - RBA of procedure discussed, questions answered  - informed consent obtained and signed  - see procedure note  - IVA consent form signed and scanned, 03.04.21  - IVE consent form signed and scanned, 06.04.21  - Eylea4U benefits investigation started, 04.30.21 -- approved as of 06.04.21  - f/u in 5 wks -- DFE/OCT, possible injection  3. Age related macular degeneration, non-exudative, OD  - The incidence, anatomy, and pathology of dry AMD, risk of progression, and the AREDS and AREDS 2 study including smoking risks discussed with patient.  - recommend Amsler grid monitoring  4. Pigmentary retinopathy OU  - peripheral pigmented CR scarring and perivascular pigment clumping / bone spicules OU  - VA remains fairly good (20/20 OD, 20/40 OS with exudative ARMD OS)  - denies night blindness  - denies family history of vision problems  - discussed findings  - broad differential for pigmentary retinopathy:  Congenital (RP), infectious (TB, lyme dx, syphilis, toxo, bartonella), autoimmune and toxic etiologies  - labs checked (4.2.21):  All essential WNL   Rheumatoid factor   CBC   Comprehensive metabolic panel     RPR   ANA   ESR, CRP   ACE   ANCA   Toxoplasma antibodies IgG / IgM   Quantiferon - TB Gold Plus   HLA, A, B, C (IR)   Fluorescent treponemal ab (fla) - IgG - bld   VDRL Serum   Bartonella antibody, 06.04.21  - [facility] -- drawn on 6.4.21  Results Positive for AGBL5, ALMS1, OPA1 -- unknown significance             - f/u in 4-5 weeks  5,6. Hypertensive retinopathy OU  - discussed importance of tight BP control  - monitor  7. Mixed form age related cataracts OU  - The symptoms  of cataract, surgical options, and treatments and risks were discussed with patient.  - discussed diagnosis and progression  - not yet visually significant  - monitor for now  Ophthalmic Meds Ordered this visit:  Meds ordered this encounter  Medications   aflibercept (EYLEA) SOLN 2 mg      Return in about 5 weeks (around 04/18/2021) for f/u exu ARMD OS, DFE, OCT.  There are no Patient Instructions on file for this visit.  This document serves as a record of services personally performed by Gardiner Sleeper, MD, PhD. It was created on their behalf by Estill Bakes, COT an ophthalmic technician. The creation of this record is the provider's dictation and/or activities during the visit.    Electronically signed by: Estill Bakes, COT 11.29.22 @ 11:09 AM   This document serves as a record of services personally performed by Gardiner Sleeper, MD, PhD. It was created on their behalf by San Jetty. Owens Shark, OA an ophthalmic technician. The creation of this record is the provider's dictation and/or activities during the visit.    Electronically signed by: San Jetty. Owens Shark, [place] 11.29.2022 11:09 AM  Gardiner Sleeper, M.D., Ph.D. Diseases & Surgery of the Retina and [place] 03/14/2021   I have reviewed the above documentation for accuracy and completeness, and I agree with the above. Gardiner Sleeper, M.D., Ph.D. 03/14/21 11:10 AM   Abbreviations: M myopia (nearsighted); A astigmatism; H hyperopia (farsighted); P presbyopia; Mrx spectacle prescription;  CTL contact lenses; OD right eye; OS left eye; OU both eyes  XT exotropia; ET esotropia; PEK punctate epithelial keratitis; PEE punctate epithelial erosions; DES dry eye syndrome; MGD meibomian gland dysfunction; ATs artificial tears; PFAT's preservative free artificial tears; [place] nuclear sclerotic cataract; PSC posterior subcapsular cataract; ERM epi-retinal membrane; PVD posterior vitreous detachment; RD retinal  detachment; DM diabetes mellitus; DR diabetic retinopathy; NPDR non-proliferative diabetic retinopathy; PDR proliferative diabetic retinopathy; CSME clinically significant macular edema; DME diabetic macular edema; dbh dot blot hemorrhages; CWS cotton wool spot; POAG primary open angle glaucoma; C/D cup-to-disc ratio; HVF humphrey visual field; GVF goldmann visual field; OCT optical coherence tomography; IOP intraocular pressure; BRVO Branch retinal vein occlusion; CRVO central retinal vein occlusion; CRAO central retinal artery occlusion; BRAO branch retinal artery occlusion; RT retinal tear; SB scleral buckle; PPV pars plana vitrectomy; VH Vitreous hemorrhage; PRP panretinal laser photocoagulation; IVK intravitreal kenalog; VMT vitreomacular traction; MH Macular hole;  NVD neovascularization of the disc; NVE neovascularization elsewhere; AREDS age related eye disease study; ARMD age related macular degeneration; POAG primary open angle glaucoma; EBMD epithelial/anterior basement membrane dystrophy; ACIOL anterior chamber intraocular lens; IOL intraocular lens; PCIOL posterior chamber intraocular lens; Phaco/IOL phacoemulsification with intraocular lens placement; [place] photorefractive keratectomy; LASIK laser assisted in situ keratomileusis; HTN hypertension; DM diabetes mellitus; COPD chronic obstructive pulmonary disease

## 2021-04-12 ENCOUNTER — Encounter: Payer: Self-pay | Admitting: Nurse Practitioner

## 2021-04-12 ENCOUNTER — Ambulatory Visit (INDEPENDENT_AMBULATORY_CARE_PROVIDER_SITE_OTHER): Payer: BC Managed Care – PPO | Admitting: Nurse Practitioner

## 2021-04-12 VITALS — BP 138/81 | HR 78 | Temp 98.3°F | Resp 20 | Ht 73.0 in | Wt 249.0 lb

## 2021-04-12 DIAGNOSIS — M25561 Pain in right knee: Secondary | ICD-10-CM | POA: Diagnosis not present

## 2021-04-12 MED ORDER — METHYLPREDNISOLONE ACETATE 40 MG/ML IJ SUSP
40.0000 mg | Freq: Once | INTRAMUSCULAR | Status: AC
  Administered 2021-04-12: 09:00:00 40 mg via INTRA_ARTICULAR

## 2021-04-12 MED ORDER — LIDOCAINE HCL 2 % IJ SOLN
1.0000 mL | Freq: Once | INTRAMUSCULAR | Status: AC
  Administered 2021-04-12: 09:00:00 20 mg

## 2021-04-12 NOTE — Patient Instructions (Addendum)
Joint Steroid Injection A joint steroid injection is a procedure to relieve swelling and pain in a joint. Steroids are medicines that reduce inflammation. In this procedure, your health care provider uses a syringe and a needle to inject a steroid medicine into a painful and inflamed joint. A pain-relieving medicine (anesthetic) may be injected along with the steroid. In some cases, your health care provider may use an imaging technique such as ultrasound or fluoroscopy toguide the injection. Joints that are often treated with steroid injections include the knee, shoulder, hip, and spine. These injections may also be used in the elbow, ankle, and joints of the hands or feet. You may have joint steroid injections as part of your treatment for inflammation caused by: Gout. Rheumatoid arthritis. Advanced wear-and-tear arthritis (osteoarthritis). Tendinitis. Bursitis. Joint steroid injections may be repeated, but having them too often can damage a joint or the skin over the joint. You should not have joint steroidinjections less than 6 weeks apart or more than four times a year. Tell a health care provider about: Any allergies you have. All medicines you are taking, including vitamins, herbs, eye drops, creams, and over-the-counter medicines. Any problems you or family members have had with anesthetic medicines. Any blood disorders you have. Any surgeries you have had. Any medical conditions you have. Whether you are pregnant or may be pregnant. What are the risks? Generally, this is a safe treatment. However, problems may occur, including: Infection. Bleeding. Allergic reactions to medicines. Damage to the joint or tissues around the joint. Thinning of skin or loss of skin color over the joint. Temporary flushing of the face or chest. Temporary increase in pain. Temporary increase in blood sugar. Failure to relieve inflammation or pain. What happens before the treatment? Medicines Ask  your health care provider about: Changing or stopping your regular medicines. This is especially important if you are taking diabetes medicines or blood thinners. Taking medicines such as aspirin and ibuprofen. These medicines can thin your blood. Do not take these medicines unless your health care provider tells you to take them. Taking over-the-counter medicines, vitamins, herbs, and supplements. General instructions You may have imaging tests of your joint. Ask your health care provider if you can drive yourself home after the procedure. What happens during the treatment?  Your health care provider will position you for the injection and locate the injection site over your joint. The skin over the joint will be cleaned with a germ-killing soap. Your health care provider may: Spray a numbing solution (topical anesthetic) over the injection site. Inject a local anesthetic under the skin above your joint. The needle will be placed through your skin into your joint. Your health care provider may use imaging to guide the needle to the right spot for the injection. If imaging is used, a special contrast dye may be injected to confirm that the needle is in the correct location. The steroid medicine will be injected into your joint. Anesthetic may be injected along with the steroid. This may be a medicine that relieves pain for a short time (short-acting anesthetic) or for a longer time (long-acting anesthetic). The needle will be removed, and an adhesive bandage (dressing) will be placed over the injection site. The procedure may vary among health care providers and hospitals. What can I expect after the treatment? You will be able to go home after the treatment. It is normal to feel slight flushing for a few days after the injection. After the treatment, it is common to   have an increase in joint pain after the anesthetic has worn off. This may happen about an hour after a short-acting anesthetic  or about 8 hours after a longer-acting anesthetic. You should begin to feel relief from joint pain and swelling after 24 to 48 hours. Contact your health care provider if you do not begin to feel relief after 2 days. Follow these instructions at home: Injection site care Leave the adhesive dressing over your injection site in place until your health care provider says you can remove it. Check your injection site every day for signs of infection. Check for: More redness, swelling, or pain. Fluid or blood. Warmth. Pus or a bad smell. Activity Return to your normal activities as told by your health care provider. Ask your health care provider what activities are safe for you. You may be asked to limit activities that put stress on the joint for a few days. Do joint exercises as told by your health care provider. Do not take baths, swim, or use a hot tub until your health care provider approves. Ask your health care provider if you may take showers. You may only be allowed to take sponge baths. Managing pain, stiffness, and swelling  If directed, put ice on the joint. To do this: Put ice in a plastic bag. Place a towel between your skin and the bag. Leave the ice on for 20 minutes, 2-3 times a day. Remove the ice if your skin turns bright red. This is very important. If you cannot feel pain, heat, or cold, you have a greater risk of damage to the area. Raise (elevate) your joint above the level of your heart when you are sitting or lying down.  General instructions Take over-the-counter and prescription medicines only as told by your health care provider. Do not use any products that contain nicotine or tobacco, such as cigarettes, e-cigarettes, and chewing tobacco. These can delay joint healing. If you need help quitting, ask your health care provider. If you have diabetes, be aware that your blood sugar may be slightly elevated for several days after the injection. Keep all follow-up visits.  This is important. Contact a health care provider if you have: Chills or a fever. Any signs of infection at your injection site. Increased pain or swelling or no relief after 2 days. Summary A joint steroid injection is a treatment to relieve pain and swelling in a joint. Steroids are medicines that reduce inflammation. Your health care provider may add an anesthetic along with the steroid. You may have joint steroid injections as part of your arthritis treatment. Joint steroid injections may be repeated, but having them too often can damage a joint or the skin over the joint. Contact your health care provider if you have a fever, chills, or signs of infection, or if you get no relief from joint pain or swelling. This information is not intended to replace advice given to you by your health care provider. Make sure you discuss any questions you have with your healthcare provider. Document Revised: 09/11/2019 Document Reviewed: 09/11/2019 Elsevier Patient Education  2022 Elsevier Inc.  

## 2021-04-12 NOTE — Progress Notes (Signed)
° °  Subjective:    [name], male    DOB: May 29, 1955, 65 y.o.   MRN: [id number]   Chief Complaint: Knee Pain (Right/)   HPI Patient come sin today c/right knee pain. Started several months ago. Was seen  August with same complaint. Was given prednisone and voltaren which helped slightly. The last week he was walking to his outside building several times a day and that has aggravated it even more. Rates pain 8/10 over Christmas and now 5/10. Straightening and flexing and walking increase pain. Slight edema.    Review of Systems     Objective:   Physical Exam Vitals and nursing note reviewed.  Constitutional:      Appearance: Normal appearance.  Cardiovascular:     Rate and Rhythm: Normal rate and regular rhythm.     Heart sounds: Normal heart sounds.  Pulmonary:     Effort: Pulmonary effort is normal.     Breath sounds: Normal breath sounds.  Skin:    General: Skin is warm.  Neurological:     General: No focal deficit present.     Mental Status: He is alert and oriented to person, place, and time.  Psychiatric:        Mood and Affect: Mood normal.        Behavior: Behavior normal.    BP 138/81    Pulse 78    Temp 98.3 F (36.8 C) (Temporal)    Resp 20    Ht 6\' 1"  (1.854 m)    Wt 249 lb (112.9 kg)    SpO2 92%    BMI 32.85 kg/m   Joint Injection/Arthrocentesis  Date/Time: 04/12/2021 8:30 AM Performed by: Chevis Pretty, FNP Authorized by: Hassell Done Mary-Margaret, FNP  Indications: joint swelling, pain and diagnostic evaluation  Body area: knee Joint: right knee Local anesthesia used: no  Anesthesia: Local anesthesia used: no  Sedation: Patient sedated: no  Needle size: 22 G Ultrasound guidance: no Approach: inferior Methylprednisolone amount: 40 mg Lidocaine 2% amount: 1 mL Patient tolerance: patient tolerated the procedure well with no immediate complications     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      Assessment & Plan:  Eric Key in today with chief complaint of Knee Pain (Right/)   1. Acute pain of right knee Rest Ice bid Compression wrap Elevate when sitting - methylPREDNISolone acetate (DEPO-MEDROL) injection 40 mg - lidocaine (XYLOCAINE) 2 % (with pres) injection 20 mg    The above assessment and management plan was discussed with the patient. The patient verbalized understanding of and has agreed to the management plan. Patient is aware to call the clinic if symptoms persist or worsen. Patient is aware when to return to the clinic for a follow-up visit. Patient educated on when it is appropriate to go to the emergency department.   Mary-Margaret Hassell Done, FNP

## 2021-04-14 NOTE — Progress Notes (Signed)
[facility] Note  04/18/2021     CHIEF COMPLAINT Patient presents for Retina Follow Up  HISTORY OF PRESENT ILLNESS: Eric Key is a 65 y.o. male who presents to the clinic today for:   HPI     Retina Follow Up   Patient presents with  Wet AMD.  In left eye.  This started years ago.  Severity is moderate.  Duration of 5 weeks.  Since onset it is stable.  I, the attending physician,  performed the HPI with the patient and updated documentation appropriately.        Comments   65 y/o male pt here for 5 wk f/u for exu ARMD OS.  No change in [place] OU noticed.  Denies pain, FOL, floaters.  No gtts.      Last edited by [name], MD on 04/18/2021  8:48 AM.    Pt states no change in vision   Referring physician: [name], [name], MD [place],  [place] [address]  HISTORICAL INFORMATION:   Selected notes from the MEDICAL RECORD NUMBER Referred by Dr. [name] for concern of mac heme OS.   CURRENT MEDICATIONS: No current outpatient medications on file. (Ophthalmic Drugs)   No current facility-administered medications for this visit. (Ophthalmic Drugs)   Current Outpatient Medications (Other)  Medication Sig   rosuvastatin (CRESTOR) 5 MG tablet Take 1 tablet (5 mg total) by mouth daily.   No current facility-administered medications for this visit. (Other)   REVIEW OF SYSTEMS: ROS   Positive for: Eyes Negative for: Constitutional, Gastrointestinal, Neurological, Skin, Genitourinary, Musculoskeletal, HENT, Endocrine, Cardiovascular, Respiratory, Psychiatric, Allergic/Imm, Heme/Lymph Last edited by Matthew Folks, COA on 04/18/2021  7:36 AM.     ALLERGIES Allergies  Allergen Reactions   Penicillins Hives   PAST MEDICAL HISTORY Past Medical History:  Diagnosis Date   Cataract    Mixed form OU   History of adenomatous polyps of colon 03/16/2020   Hypertension    past hx    Hypertensive retinopathy    OU   Macular  degeneration    Dry OD; Wet OS   Past Surgical History:  Procedure Laterality Date   ACHILLES TENDON REPAIR  2010   CHOLECYSTECTOMY     COLONOSCOPY     GALLBLADDER SURGERY      FAMILY HISTORY Family History  Problem Relation Age of Onset   Diabetes Brother    Heart disease Brother    Diabetes Maternal Grandfather    Colon polyps Neg Hx    Colon cancer Neg Hx    Esophageal cancer Neg Hx    Rectal cancer Neg Hx    Stomach cancer Neg Hx    SOCIAL HISTORY Social History   Tobacco Use   Smoking status: Former    Types: Cigarettes    Quit date: 02/05/2020    Years since quitting: 1.2   Smokeless tobacco: Never  Vaping Use   Vaping Use: Never used  Substance Use Topics   Alcohol use: Yes    Comment: occ   Drug use: Never       OPHTHALMIC EXAM: Base Eye Exam     Visual Acuity (Snellen - Linear)       Right Left   Dist cc 20/20 -2 20/25 +   Dist ph cc  NI    Correction: Glasses         Tonometry (Tonopen, 7:41 AM)       Right  Left   Pressure 10 12         Pupils       Dark Light Shape React APD   Right 3 2 Round Brisk None   Left 3 2 Round Brisk None         Visual Fields (Counting fingers)       Left Right    Full Full         Extraocular Movement       Right Left    Full, Ortho Full, Ortho         Neuro/Psych     Oriented x3: Yes   Mood/Affect: Normal         Dilation     Both eyes: 1.0% Mydriacyl, 2.5% Phenylephrine @ 7:41 AM           Slit Lamp and Fundus Exam     Slit Lamp Exam       Right Left   Lids/Lashes Dermatochalasis - upper lid Dermatochalasis - upper lid   Conjunctiva/Sclera White and quiet White and quiet   Cornea trace PEE, mild Arcus, mild Debris in tear film mild Arcus, mild Debris in tear film, 2-3+ Punctate epithelial erosions, decreased TBUT   Anterior Chamber deep, clear, narrow angles deep, clear, narrow angles   Iris Round and moderately dilated, mild anterior bowing Round and moderately  dilated, mild anterior bowing   Lens 2+ Nuclear sclerosis, 2+ Cortical cataract 2+ Nuclear sclerosis, 2+ Cortical cataract   Anterior Vitreous Vitreous syneresis Vitreous syneresis         Fundus Exam       Right Left   Disc Pink and Sharp, focal PPP ST, Compact Pink and Sharp, Compact   C/D Ratio 0.2 0.3   Macula Flat, Drusen, RPE mottling and clumping, early Atrophy, punctate IRH -- improved Abnormal foveal reflex, +CNV with heme improved, +central edema / cystic changes-- slightly improved, Drusen, RPE mottling and clumping   Vessels attenuated, mild tortuousity, perivascular pigment clumping peripherally attenuated, mild tortuousity, perivascular pigment clumping peripherally   Periphery Attached, peripheral pigmented CR scarring and perivascular bone spicules 360 Attached, peripheral pigmented CR scarring and perivascular bone spicules 360           IMAGING AND PROCEDURES  Imaging and Procedures for _0 @  OCT, Retina - OU - Both Eyes       Right Eye Quality was good. Central Foveal Thickness: 299. Progression has been stable. Findings include normal foveal contour, no IRF, no SRF, retinal drusen , outer retinal atrophy, pigment epithelial detachment (Persistent vitreous opacities; stable low lying PEDs).   Left Eye Quality was borderline. Central Foveal Thickness: 305. Progression has improved. Findings include abnormal foveal contour, intraretinal fluid, subretinal hyper-reflective material, pigment epithelial detachment, outer retinal atrophy, retinal drusen (Interval improvement in IRF temporal macula and fovea).   Notes *Images captured and stored on drive  Diagnosis / Impression:  OD: non-exu ARMD  OS: exu ARMD -- Interval improvement in IRF temporal macula and fovea  Clinical management:  See below  Abbreviations: NFP - Normal foveal profile. CME - cystoid macular edema. PED - pigment epithelial detachment. IRF - intraretinal fluid. SRF - subretinal fluid. EZ  - ellipsoid zone. ERM - epiretinal membrane. ORA - outer retinal atrophy. ORT - outer retinal tubulation. SRHM - subretinal hyper-reflective material      Intravitreal Injection, Pharmacologic Agent - OS - Left Eye       Time Out 04/18/2021. 7:56 AM. Confirmed correct patient, procedure, site,  and patient consented.   Anesthesia Topical anesthesia was used. Anesthetic medications included Lidocaine 2%, Proparacaine 0.5%.   Procedure Preparation included 5% betadine to ocular surface, eyelid speculum. A (32g) needle was used.   Injection: 2 mg aflibercept 2 MG/0.05ML   Route: Intravitreal, Site: Left Eye   NDC: [id number], Lot: [id number], Expiration date: 02/14/2022, Waste: 0.05 mL   Post-op Post injection exam found visual acuity of at least counting fingers. The patient tolerated the procedure well. There were no complications. The patient received written and verbal post procedure care education. Post injection medications were not given.            ASSESSMENT/PLAN:   ICD-10-CM   1. Exudative age-related macular degeneration of left eye with active choroidal neovascularization (HCC)  H35.3221 OCT, Retina - OU - Both Eyes    Intravitreal Injection, Pharmacologic Agent - OS - Left Eye    aflibercept (EYLEA) SOLN 2 mg    2. 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 Essential hypertension  I10     5. Hypertensive retinopathy of both eyes  H35.033     6. Combined forms of age-related cataract of both eyes  H25.813       1. 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, OS  - s/p IVA OS #1 (03.04.21), #2 (03.04.21), #3 (04.30.21) -- IVA resistance  - s/p IVE OS #1 (06.04.21), #2 (07.08.21), #3 (08.09.21), #4 (09.13.21), #5 (10.18.21), #6 (11.24.21), #7 (12.31.21), #8 (02.11.22), #9 (03.25.22), #10 (05.02.22), #11 (06.06.22), #12 (07.13.22), #13 (08.17.22), #14 (09.21.22), #15  (10.26.22), #16 (11.29.22)  - pt reports stable vision OS  - exam shows central CNV with +subretinal heme -- improved  - BCVA 20/25 OS (stably improved)  - OCT shows Interval decrease in IRF central and temporal macula at 5 wks  - recommend IVE OS #17 today, 01.03.23, w/ f/u in 5 wks  - pt wishes to proceed  - RBA of procedure discussed, questions answered  - informed consent obtained and signed  - see procedure note  - IVA consent form signed and scanned, 03.04.21  - IVE consent form signed and scanned, 06.04.21  - Eylea4U benefits investigation started, 04.30.21 -- approved as of 06.04.21  - f/u in 5 wks -- DFE/OCT, possible injection  2. Age related macular degeneration, non-exudative, OD  - The incidence, anatomy, and pathology of dry AMD, risk of progression, and the AREDS and AREDS 2 study including smoking risks discussed with patient.  - recommend Amsler grid monitoring  3. Pigmentary retinopathy OU  - peripheral pigmented CR scarring and perivascular pigment clumping / bone spicules OU  - VA remains fairly good (20/20 OD, 20/40 OS with exudative ARMD OS)  - denies night blindness  - denies family history of vision problems  - discussed findings  - broad differential for pigmentary retinopathy:  Congenital (RP), infectious (TB, lyme dx, syphilis, toxo, bartonella), autoimmune and toxic etiologies  - labs checked (4.2.21):  All essential WNL   Rheumatoid factor   CBC   Comprehensive metabolic panel     RPR   ANA   ESR, CRP   ACE   ANCA   Toxoplasma antibodies IgG / IgM   Quantiferon - TB Gold Plus   HLA, A, B, C (IR)   Fluorescent treponemal ab (fla) - IgG - bld   VDRL Serum   Bartonella antibody, 06.04.21  - [facility] --  drawn on 6.4.21                         Results Positive for AGBL5, ALMS1, OPA1 -- unknown significance             - f/u in 4-5 weeks  4,5. Hypertensive retinopathy OU  - discussed importance of tight BP control  - monitor  6.  Mixed form age related cataracts OU  - The symptoms of cataract, surgical options, and treatments and risks were discussed with patient.  - discussed diagnosis and progression  - not yet visually significant  - monitor for now  Ophthalmic Meds Ordered this visit:  Meds ordered this encounter  Medications   aflibercept (EYLEA) SOLN 2 mg      Return in about 5 weeks (around 05/23/2021) for f/u exu ARMD OS, DFE, OCT.  There are no Patient Instructions on file for this visit.  This document serves as a record of services personally performed by Gardiner Sleeper, MD, PhD. It was created on their behalf by Estill Bakes, COT an ophthalmic technician. The creation of this record is the provider's dictation and/or activities during the visit.    Electronically signed by: Estill Bakes, COT 12.30.22 @ 8:50 AM   This document serves as a record of services personally performed by Gardiner Sleeper, MD, PhD. It was created on their behalf by San Jetty. Owens Shark, OA an ophthalmic technician. The creation of this record is the provider's dictation and/or activities during the visit.    Electronically signed by: San Jetty. Owens Shark, [place] 01.03.2023 8:50 AM.  Gardiner Sleeper, M.D., Ph.D. Diseases & Surgery of the Retina and Vitreous [facility]  I have reviewed the above documentation for accuracy and completeness, and I agree with the above. Gardiner Sleeper, M.D., Ph.D. 04/18/21 8:51 AM   Abbreviations: M myopia (nearsighted); A astigmatism; H hyperopia (farsighted); P presbyopia; Mrx spectacle prescription;  CTL contact lenses; OD right eye; OS left eye; OU both eyes  XT exotropia; ET esotropia; PEK punctate epithelial keratitis; PEE punctate epithelial erosions; DES dry eye syndrome; MGD meibomian gland dysfunction; ATs artificial tears; PFAT's preservative free artificial tears; [place] nuclear sclerotic cataract; PSC posterior subcapsular cataract; ERM epi-retinal membrane; PVD posterior  vitreous detachment; RD retinal detachment; DM diabetes mellitus; DR diabetic retinopathy; NPDR non-proliferative diabetic retinopathy; PDR proliferative diabetic retinopathy; CSME clinically significant macular edema; DME diabetic macular edema; dbh dot blot hemorrhages; CWS cotton wool spot; POAG primary open angle glaucoma; C/D cup-to-disc ratio; HVF humphrey visual field; GVF goldmann visual field; OCT optical coherence tomography; IOP intraocular pressure; BRVO Branch retinal vein occlusion; CRVO central retinal vein occlusion; CRAO central retinal artery occlusion; BRAO branch retinal artery occlusion; RT retinal tear; SB scleral buckle; PPV pars plana vitrectomy; VH Vitreous hemorrhage; PRP panretinal laser photocoagulation; IVK intravitreal kenalog; VMT vitreomacular traction; MH Macular hole;  NVD neovascularization of the disc; NVE neovascularization elsewhere; AREDS age related eye disease study; ARMD age related macular degeneration; POAG primary open angle glaucoma; EBMD epithelial/anterior basement membrane dystrophy; ACIOL anterior chamber intraocular lens; IOL intraocular lens; PCIOL posterior chamber intraocular lens; Phaco/IOL phacoemulsification with intraocular lens placement; [place] photorefractive keratectomy; LASIK laser assisted in situ keratomileusis; HTN hypertension; DM diabetes mellitus; COPD chronic obstructive pulmonary disease

## 2021-04-18 ENCOUNTER — Ambulatory Visit (INDEPENDENT_AMBULATORY_CARE_PROVIDER_SITE_OTHER): Payer: BC Managed Care – PPO | Admitting: Ophthalmology

## 2021-04-18 ENCOUNTER — Other Ambulatory Visit: Payer: Self-pay

## 2021-04-18 ENCOUNTER — Encounter (INDEPENDENT_AMBULATORY_CARE_PROVIDER_SITE_OTHER): Payer: Self-pay | Admitting: Ophthalmology

## 2021-04-18 DIAGNOSIS — H35033 Hypertensive retinopathy, bilateral: Secondary | ICD-10-CM

## 2021-04-18 DIAGNOSIS — H25813 Combined forms of age-related cataract, bilateral: Secondary | ICD-10-CM

## 2021-04-18 DIAGNOSIS — H353221 Exudative age-related macular degeneration, left eye, with active choroidal neovascularization: Secondary | ICD-10-CM

## 2021-04-18 DIAGNOSIS — H353112 Nonexudative age-related macular degeneration, right eye, intermediate dry stage: Secondary | ICD-10-CM

## 2021-04-18 DIAGNOSIS — H3552 Pigmentary retinal dystrophy: Secondary | ICD-10-CM | POA: Diagnosis not present

## 2021-04-18 DIAGNOSIS — I10 Essential (primary) hypertension: Secondary | ICD-10-CM | POA: Diagnosis not present

## 2021-04-18 MED ORDER — AFLIBERCEPT 2MG/0.05ML IZ SOLN FOR KALEIDOSCOPE
2.0000 mg | INTRAVITREAL | Status: AC | PRN
  Administered 2021-04-18: 2 mg via INTRAVITREAL

## 2021-05-03 ENCOUNTER — Ambulatory Visit (INDEPENDENT_AMBULATORY_CARE_PROVIDER_SITE_OTHER): Payer: BC Managed Care – PPO | Admitting: Family Medicine

## 2021-05-03 ENCOUNTER — Encounter: Payer: Self-pay | Admitting: Family Medicine

## 2021-05-03 VITALS — BP 153/84 | HR 98 | Temp 97.7°F | Ht 73.0 in | Wt 248.0 lb

## 2021-05-03 DIAGNOSIS — M25561 Pain in right knee: Secondary | ICD-10-CM | POA: Diagnosis not present

## 2021-05-03 NOTE — Progress Notes (Signed)
Hester Forget is a 66 y.o. male who comes in with right knee pain. It has been going on since august and he claims that almost nothing helps. He has tried ibuprofen, tylenol, a knee brace, steroid injection (which helped for a few days), and even "the good stuff" which was an unknown pain med left over from a bout with kidney stones.He says the pain is constant but is worst in the morning and at the end of the day. Using the knee makes it worse. It is impacting his ability to walk, causing a limp. Also, it pops and sometimes locks after long periods of immobility.  The pain is more central anterior in the knee by what he shows.  ROS Admits to pain and swelling in the right knee. Denies redness, discoloration, and inciting event.  PE MSK - right knee is slightly swollen, no pain on movement, popped when extended, grinding sensation with movement passive and active movement  A&E Osteoarthritis Possible meniscus tear  Refer to an orthopedic surgeon  Leanor Kail, PA Student 05/03/21  I was personally present for all components of the history, physical exam and/or medical decision making.  I agree with the documentation performed by the PA student and agree with assessment and plan above.  PA student was Warehouse manager. Caryl Pina, MD [facility] 05/03/2021, 2:03 PM

## 2021-05-03 NOTE — Progress Notes (Signed)
BP (!) 153/84    Pulse 98    Temp 97.7 F (36.5 C) (Temporal)    Ht 6\' 1"  (1.854 m)    Wt 248 lb (112.5 kg)    SpO2 92%    BMI 32.72 kg/m    Subjective:   [name], male    DOB: Nov 20, 1955, 66 y.o.   MRN: [id number]  HPI: Eric Key is a 66 y.o. male presenting on 05/03/2021 for Knee Pain   HPI Knee pain since august.  Ibuprofen and knee brace and tylenol did not help.  He is limping and it is affecting his gate.  It is hard to walk on inclines or steps.  Stiff after rest. Achy in nature.  He does feel like it pops and catches sometimes and locks.  He does not feel like it gives way.  It does get worse as he is up and moving around and also is worse or more stiff after he has been sitting for long periods of time.  He continues to move around and do things but it has affected his gait and is not improving.  He had an x-ray in August which showed mild osteoarthritis and has been treating since then with anti-inflammatories.  He also had a steroid knee injection in December and he said that he may be got 2 days of relief from that.  Relevant past medical, surgical, family and social history reviewed and updated as indicated. Interim medical history since our last visit reviewed. Allergies and medications reviewed and updated.  Review of Systems  Constitutional:  Negative for chills and fever.  Respiratory:  Negative for shortness of breath and wheezing.   Cardiovascular:  Negative for chest pain and leg swelling.  Musculoskeletal:  Positive for arthralgias. Negative for back pain and gait problem.  Skin:  Negative for rash.  All other systems reviewed and are negative.  Per HPI unless specifically indicated above   Allergies as of 05/03/2021       Reactions   Penicillins Hives        Medication List        Accurate as of May 03, 2021  9:31 AM. If you have any questions, ask your nurse or doctor.          rosuvastatin 5 MG tablet Commonly  known as: Crestor Take 1 tablet (5 mg total) by mouth daily.         Objective:   BP (!) 153/84    Pulse 98    Temp 97.7 F (36.5 C) (Temporal)    Ht 6\' 1"  (1.854 m)    Wt 248 lb (112.5 kg)    SpO2 92%    BMI 32.72 kg/m   Wt Readings from Last 3 Encounters:  05/03/21 248 lb (112.5 kg)  04/12/21 249 lb (112.9 kg)  12/27/20 241 lb (109.3 kg)    Physical Exam Vitals and nursing note reviewed.  Constitutional:      General: He is not in acute distress.    Appearance: He is well-developed. He is not diaphoretic.  Eyes:     General: No scleral icterus.    Conjunctiva/sclera: Conjunctivae normal.  Neck:     Thyroid: No thyromegaly.  Musculoskeletal:        General: Normal range of motion.     Cervical back: Neck supple.     Right knee: Effusion present. No swelling or erythema. Normal range of motion. No tenderness (No tenderness to palpation on  exam).     Instability Tests: Anterior drawer test negative. Posterior drawer test negative. Anterior Lachman test negative. Medial McMurray test negative and lateral McMurray test negative.     Right lower leg: No swelling.     Left lower leg: No swelling.  Lymphadenopathy:     Cervical: No cervical adenopathy.  Skin:    General: Skin is warm and dry.     Findings: No rash.  Neurological:     Mental Status: He is alert and oriented to person, place, and time.     Coordination: Coordination normal.  Psychiatric:        Behavior: Behavior normal.      Assessment & Plan:   Problem List Items Addressed This Visit       Other   Acute pain of right knee - Primary   Relevant Orders   Ambulatory referral to Orthopedic Surgery    Likely meniscus injury versus osteoarthritis or a combination of both.  Patient has been taking anti-inflammatories since August and using Tylenol.  He is also received a steroid injection but only given 2 days of relief.  Will refer to orthopedic Follow up plan: Return if symptoms worsen or fail to  improve.  Counseling provided for all of the vaccine components Orders Placed This Encounter  Procedures   Ambulatory referral to [place], MD [place] 05/03/2021, 9:31 AM

## 2021-05-08 ENCOUNTER — Encounter: Payer: Self-pay | Admitting: Family Medicine

## 2021-05-11 DIAGNOSIS — M25561 Pain in right knee: Secondary | ICD-10-CM | POA: Diagnosis not present

## 2021-05-19 NOTE — Progress Notes (Signed)
[facility] Note  05/23/2021     CHIEF COMPLAINT Patient presents for Retina Follow Up  HISTORY OF PRESENT ILLNESS: Eric Key is a 66 y.o. male who presents to the clinic today for:   HPI     Retina Follow Up   Patient presents with  Wet AMD.  In left eye.  Duration of 5 weeks.  Since onset it is stable.  I, the attending physician,  performed the HPI with the patient and updated documentation appropriately.        Comments   5 week follow up Exu ARMD OS-  Eyes are doing well, no new problems.       Last edited by [name], MD on 05/23/2021  8:46 AM.     Pt states no change in vision   Referring physician: [name], [name], MD [place],  [place] [id number]  HISTORICAL INFORMATION:   Selected notes from the MEDICAL RECORD NUMBER Referred by Dr. [name] for concern of mac heme OS.   CURRENT MEDICATIONS: No current outpatient medications on file. (Ophthalmic Drugs)   No current facility-administered medications for this visit. (Ophthalmic Drugs)   Current Outpatient Medications (Other)  Medication Sig   rosuvastatin (CRESTOR) 5 MG tablet Take 1 tablet (5 mg total) by mouth daily.   No current facility-administered medications for this visit. (Other)   REVIEW OF SYSTEMS: ROS   Positive for: Eyes Negative for: Constitutional, Gastrointestinal, Neurological, Skin, Genitourinary, Musculoskeletal, HENT, Endocrine, Cardiovascular, Respiratory, Psychiatric, Allergic/Imm, Heme/Lymph Last edited by Leonie Douglas, COA on 05/23/2021  7:39 AM.      ALLERGIES Allergies  Allergen Reactions   Penicillins Hives   PAST MEDICAL HISTORY Past Medical History:  Diagnosis Date   Cataract    Mixed form OU   History of adenomatous polyps of colon 03/16/2020   Hypertension    past hx    Hypertensive retinopathy    OU   Macular degeneration    Dry OD; Wet OS   Past Surgical History:  Procedure Laterality Date    ACHILLES TENDON REPAIR  2010   CHOLECYSTECTOMY     COLONOSCOPY     GALLBLADDER SURGERY      FAMILY HISTORY Family History  Problem Relation Age of Onset   Diabetes Brother    Heart disease Brother    Diabetes Maternal Grandfather    Colon polyps Neg Hx    Colon cancer Neg Hx    Esophageal cancer Neg Hx    Rectal cancer Neg Hx    Stomach cancer Neg Hx    SOCIAL HISTORY Social History   Tobacco Use   Smoking status: Some Days    Types: Cigarettes, Cigars    Last attempt to quit: 02/05/2020    Years since quitting: 1.2   Smokeless tobacco: Never  Vaping Use   Vaping Use: Never used  Substance Use Topics   Alcohol use: Yes    Comment: occ   Drug use: Never       OPHTHALMIC EXAM: Base Eye Exam     Visual Acuity (Snellen - Linear)       Right Left   Dist cc 20/20 -2 20/25 -2   Dist ph cc  NI    Correction: Glasses         Tonometry (Tonopen, 7:43 AM)       Right Left   Pressure 12 13         Pupils  Dark Light Shape React APD   Right 3 2 Round Minimal None   Left 3 2 Round Minimal None         Visual Fields (Counting fingers)       Left Right    Full Full         Extraocular Movement       Right Left    Full Full         Neuro/Psych     Oriented x3: Yes   Mood/Affect: Normal         Dilation     Both eyes: 1.0% Mydriacyl, 2.5% Phenylephrine @ 7:43 AM           Slit Lamp and Fundus Exam     Slit Lamp Exam       Right Left   Lids/Lashes Dermatochalasis - upper lid Dermatochalasis - upper lid   Conjunctiva/Sclera White and quiet White and quiet   Cornea 1+ PEE, mild Arcus, mild Debris in tear film mild Arcus, mild Debris in tear film, 2-3+ Punctate epithelial erosions centrally, decreased TBUT   Anterior Chamber deep, clear, narrow angles deep, clear, narrow angles   Iris Round and moderately dilated, mild anterior bowing Round and moderately dilated, mild anterior bowing   Lens 2-3+ Nuclear sclerosis, 2-3+  Cortical cataract 2-3+ Nuclear sclerosis, 2-3+ Cortical cataract   Anterior Vitreous Vitreous syneresis Vitreous syneresis         Fundus Exam       Right Left   Disc Pink and Sharp, focal PPP ST, Compact Pink and Sharp, Compact   C/D Ratio 0.2 0.3   Macula Flat, Drusen, RPE mottling and clumping, early Atrophy, punctate IRH -- improved Abnormal foveal reflex, +CNV with heme improved, +central edema / cystic changes-- slightly improved, Drusen, RPE mottling and clumping   Vessels attenuated, mild tortuousity, perivascular pigment clumping peripherally attenuated, mild tortuousity, perivascular pigment clumping peripherally   Periphery Attached, peripheral pigmented CR scarring and perivascular bone spicules 360 Attached, peripheral pigmented CR scarring and perivascular bone spicules 360           Refraction     Wearing Rx       Sphere Cylinder Axis Add   Right [place] +1.25 177 +2.25   Left [place] +0.75 170 +2.25           IMAGING AND PROCEDURES  Imaging and Procedures for _0 @  OCT, Retina - OU - Both Eyes       Right Eye Quality was good. Central Foveal Thickness: 301. Progression has been stable. Findings include normal foveal contour, no IRF, no SRF, retinal drusen , outer retinal atrophy, pigment epithelial detachment (Persistent vitreous opacities; stable low lying PEDs and ORA).   Left Eye Quality was poor. Central Foveal Thickness: 307. Progression has improved. Findings include abnormal foveal contour, intraretinal fluid, subretinal hyper-reflective material, pigment epithelial detachment, outer retinal atrophy, retinal drusen (Mild interval improvement in IRF temporal macula and fovea).   Notes *Images captured and stored on drive  Diagnosis / Impression:  OD: non-exu ARMD  OS: exu ARMD -- Mild interval improvement in IRF temporal macula and fovea  Clinical management:  See below  Abbreviations: NFP - Normal foveal profile. CME - cystoid macular  edema. PED - pigment epithelial detachment. IRF - intraretinal fluid. SRF - subretinal fluid. EZ - ellipsoid zone. ERM - epiretinal membrane. ORA - outer retinal atrophy. ORT - outer retinal tubulation. SRHM - subretinal hyper-reflective material      Intravitreal Injection,  Pharmacologic Agent - OS - Left Eye       Time Out 05/23/2021. 8:04 AM. Confirmed correct patient, procedure, site, and patient consented.   Anesthesia Topical anesthesia was used. Anesthetic medications included Lidocaine 2%, Proparacaine 0.5%.   Procedure Preparation included 5% betadine to ocular surface, eyelid speculum. A (32g) needle was used.   Injection: 2 mg aflibercept 2 MG/0.05ML   Route: Intravitreal, Site: Left Eye   NDC: [id number], Lot: [id number], Expiration date: 04/15/2022, Waste: 0.05 mL   Post-op Post injection exam found visual acuity of at least counting fingers. The patient tolerated the procedure well. There were no complications. The patient received written and verbal post procedure care education. Post injection medications were not given.             ASSESSMENT/PLAN:   ICD-10-CM   1. Exudative age-related macular degeneration of left eye with active choroidal neovascularization (HCC)  H35.3221 OCT, Retina - OU - Both Eyes    Intravitreal Injection, Pharmacologic Agent - OS - Left Eye    aflibercept (EYLEA) SOLN 2 mg    2. 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 Essential hypertension  I10     5. Hypertensive retinopathy of both eyes  H35.033     6. Combined forms of age-related cataract of both eyes  H25.813       1. 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, OS  - s/p IVA OS #1 (03.04.21), #2 (03.04.21), #3 (04.30.21) -- IVA resistance  - s/p IVE OS #1 (06.04.21), #2 (07.08.21), #3 (08.09.21), #4 (09.13.21), #5 (10.18.21), #6 (11.24.21), #7 (12.31.21), #8 (02.11.22), #9  (03.25.22), #10 (05.02.22), #11 (06.06.22), #12 (07.13.22), #13 (08.17.22), #14 (09.21.22), #15 (10.26.22), #16 (11.29.22), #17 (01.03.23)  - pt reports stable vision OS  - exam shows central CNV with +subretinal heme -- improved  - BCVA 20/25 OS (stable)  - OCT shows Interval improvement in IRF central and temporal macula at 5 wks  - recommend IVE OS #18 today, 02.07.23, w/ f/u in 5-6 wks  - pt wishes to proceed  - RBA of procedure discussed, questions answered  - informed consent obtained and signed  - see procedure note  - IVA consent form signed and scanned, 03.04.21  - IVE consent form signed and scanned, 06.04.21  - [employer] benefits investigation started, 04.30.21 -- approved for 2023  - f/u in 5-6 wks -- DFE/OCT, possible injection  2. Age related macular degeneration, non-exudative, OD  - The incidence, anatomy, and pathology of dry AMD, risk of progression, and the AREDS and AREDS 2 study including smoking risks discussed with patient.  - recommend Amsler grid monitoring  3. Pigmentary retinopathy OU  - peripheral pigmented CR scarring and perivascular pigment clumping / bone spicules OU  - VA remains fairly good (20/20 OD, 20/40 OS with exudative ARMD OS)  - denies night blindness  - denies family history of vision problems  - discussed findings  - broad differential for pigmentary retinopathy:  Congenital (RP), infectious (TB, lyme dx, syphilis, toxo, bartonella), autoimmune and toxic etiologies  - labs checked (4.2.21):  All essential WNL   Rheumatoid factor   CBC   Comprehensive metabolic panel     RPR   ANA   ESR, CRP   ACE   ANCA   Toxoplasma antibodies IgG / IgM   Quantiferon - TB Gold Plus   HLA, A, B, C (IR)  Fluorescent treponemal ab (fla) - IgG - bld   VDRL Serum   Bartonella antibody, 06.04.21  - [facility] -- drawn on 6.4.21                         Results Positive for AGBL5, ALMS1, OPA1 -- unknown significance             - f/u in 4-5  weeks  4,5. Hypertensive retinopathy OU  - discussed importance of tight BP control  - monitor  6. Mixed form age related cataracts OU  - The symptoms of cataract, surgical options, and treatments and risks were discussed with patient.  - discussed diagnosis and progression  - not yet visually significant  - monitor for now  Ophthalmic Meds Ordered this visit:  Meds ordered this encounter  Medications   aflibercept (EYLEA) SOLN 2 mg      Return for f/u 5-6weeks, exu ARMD OS, DFE, OCT.  There are no Patient Instructions on file for this visit.  This document serves as a record of services personally performed by Gardiner Sleeper, MD, PhD. It was created on their behalf by San Jetty. Owens Shark, OA an ophthalmic technician. The creation of this record is the provider's dictation and/or activities during the visit.    Electronically signed by: San Jetty. Owens Shark, [place] 02.03.2023 8:52 AM  Gardiner Sleeper, M.D., Ph.D. Diseases & Surgery of the Retina and Vitreous [facility]  I have reviewed the above documentation for accuracy and completeness, and I agree with the above. Gardiner Sleeper, M.D., Ph.D. 05/23/21 8:52 AM   Abbreviations: M myopia (nearsighted); A astigmatism; H hyperopia (farsighted); P presbyopia; Mrx spectacle prescription;  CTL contact lenses; OD right eye; OS left eye; OU both eyes  XT exotropia; ET esotropia; PEK punctate epithelial keratitis; PEE punctate epithelial erosions; DES dry eye syndrome; MGD meibomian gland dysfunction; ATs artificial tears; PFAT's preservative free artificial tears; [place] nuclear sclerotic cataract; PSC posterior subcapsular cataract; ERM epi-retinal membrane; PVD posterior vitreous detachment; RD retinal detachment; DM diabetes mellitus; DR diabetic retinopathy; NPDR non-proliferative diabetic retinopathy; PDR proliferative diabetic retinopathy; CSME clinically significant macular edema; DME diabetic macular edema; dbh dot blot  hemorrhages; CWS cotton wool spot; POAG primary open angle glaucoma; C/D cup-to-disc ratio; HVF humphrey visual field; GVF goldmann visual field; OCT optical coherence tomography; IOP intraocular pressure; BRVO Branch retinal vein occlusion; CRVO central retinal vein occlusion; CRAO central retinal artery occlusion; BRAO branch retinal artery occlusion; RT retinal tear; SB scleral buckle; PPV pars plana vitrectomy; VH Vitreous hemorrhage; PRP panretinal laser photocoagulation; IVK intravitreal kenalog; VMT vitreomacular traction; MH Macular hole;  NVD neovascularization of the disc; NVE neovascularization elsewhere; AREDS age related eye disease study; ARMD age related macular degeneration; POAG primary open angle glaucoma; EBMD epithelial/anterior basement membrane dystrophy; ACIOL anterior chamber intraocular lens; IOL intraocular lens; PCIOL posterior chamber intraocular lens; Phaco/IOL phacoemulsification with intraocular lens placement; [place] photorefractive keratectomy; LASIK laser assisted in situ keratomileusis; HTN hypertension; DM diabetes mellitus; COPD chronic obstructive pulmonary disease

## 2021-05-22 DIAGNOSIS — M25561 Pain in right knee: Secondary | ICD-10-CM | POA: Diagnosis not present

## 2021-05-23 ENCOUNTER — Encounter (INDEPENDENT_AMBULATORY_CARE_PROVIDER_SITE_OTHER): Payer: Self-pay | Admitting: Ophthalmology

## 2021-05-23 ENCOUNTER — Ambulatory Visit (INDEPENDENT_AMBULATORY_CARE_PROVIDER_SITE_OTHER): Payer: BC Managed Care – PPO | Admitting: Ophthalmology

## 2021-05-23 ENCOUNTER — Other Ambulatory Visit: Payer: Self-pay

## 2021-05-23 DIAGNOSIS — H3552 Pigmentary retinal dystrophy: Secondary | ICD-10-CM

## 2021-05-23 DIAGNOSIS — H353221 Exudative age-related macular degeneration, left eye, with active choroidal neovascularization: Secondary | ICD-10-CM | POA: Diagnosis not present

## 2021-05-23 DIAGNOSIS — H25813 Combined forms of age-related cataract, bilateral: Secondary | ICD-10-CM

## 2021-05-23 DIAGNOSIS — H35033 Hypertensive retinopathy, bilateral: Secondary | ICD-10-CM

## 2021-05-23 DIAGNOSIS — H353112 Nonexudative age-related macular degeneration, right eye, intermediate dry stage: Secondary | ICD-10-CM

## 2021-05-23 DIAGNOSIS — I10 Essential (primary) hypertension: Secondary | ICD-10-CM | POA: Diagnosis not present

## 2021-05-23 MED ORDER — AFLIBERCEPT 2MG/0.05ML IZ SOLN FOR KALEIDOSCOPE
2.0000 mg | INTRAVITREAL | Status: AC | PRN
  Administered 2021-05-23: 2 mg via INTRAVITREAL

## 2021-06-01 DIAGNOSIS — S83241A Other tear of medial meniscus, current injury, right knee, initial encounter: Secondary | ICD-10-CM | POA: Diagnosis not present

## 2021-06-21 NOTE — Progress Notes (Shared)
[facility] Note  06/29/2021     CHIEF COMPLAINT Patient presents for No chief complaint on file.  HISTORY OF PRESENT ILLNESS: Eric Key is a 66 y.o. male who presents to the clinic today for:     Pt states no change in vision   Referring physician: [name], [name], MD [place],  [place] [id number]  HISTORICAL INFORMATION:   Selected notes from the [place] Referred by Dr. [name] for concern of mac heme OS.   CURRENT MEDICATIONS: No current outpatient medications on file. (Ophthalmic Drugs)   No current facility-administered medications for this visit. (Ophthalmic Drugs)   Current Outpatient Medications (Other)  Medication Sig   rosuvastatin (CRESTOR) 5 MG tablet Take 1 tablet (5 mg total) by mouth daily.   No current facility-administered medications for this visit. (Other)   REVIEW OF SYSTEMS:    ALLERGIES Allergies  Allergen Reactions   Penicillins Hives   PAST MEDICAL HISTORY Past Medical History:  Diagnosis Date   Cataract    Mixed form OU   History of adenomatous polyps of colon 03/16/2020   Hypertension    past hx    Hypertensive retinopathy    OU   Macular degeneration    Dry OD; Wet OS   Past Surgical History:  Procedure Laterality Date   ACHILLES TENDON REPAIR  2010   CHOLECYSTECTOMY     COLONOSCOPY     GALLBLADDER SURGERY      FAMILY HISTORY Family History  Problem Relation Age of Onset   Diabetes Brother    Heart disease Brother    Diabetes Maternal Grandfather    Colon polyps Neg Hx    Colon cancer Neg Hx    Esophageal cancer Neg Hx    Rectal cancer Neg Hx    Stomach cancer Neg Hx    SOCIAL HISTORY Social History   Tobacco Use   Smoking status: Some Days    Types: Cigarettes, Cigars    Last attempt to quit: 02/05/2020    Years since quitting: 1.3   Smokeless tobacco: Never  Vaping Use   Vaping Use: Never used  Substance Use Topics   Alcohol use: Yes     Comment: occ   Drug use: Never       OPHTHALMIC EXAM: Not recorded    IMAGING AND PROCEDURES  Imaging and Procedures for _0 @           ASSESSMENT/PLAN:   ICD-10-CM   1. Exudative age-related macular degeneration of left eye with active choroidal neovascularization ([place])  H35.3221     2. 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 Essential hypertension  I10     5. Hypertensive retinopathy of both eyes  H35.033     6. Combined forms of age-related cataract of both eyes  H25.813       1. 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, OS  - s/p IVA OS #1 (03.04.21), #2 (03.04.21), #3 (04.30.21) -- IVA resistance  - s/p IVE OS #1 (06.04.21), #2 (07.08.21), #3 (08.09.21), #4 (09.13.21), #5 (10.18.21), #6 (11.24.21), #7 (12.31.21), #8 (02.11.22), #9 (03.25.22), #10 (05.02.22), #11 (06.06.22), #12 (07.13.22), #13 (08.17.22), #14 (09.21.22), #15 (10.26.22), #16 (11.29.22), #17 (01.03.23), #18 (02.07.23)  - pt reports stable vision OS  - exam shows central CNV with +subretinal heme -- improved  - BCVA 20/25 OS (stable)  -  OCT shows Interval improvement in IRF central and temporal macula at 5 wks  - recommend IVE OS #19 today, 03.16.23, w/ f/u in 5-6 wks  - pt wishes to proceed  - RBA of procedure discussed, questions answered  - informed consent obtained and signed  - see procedure note  - IVA consent form signed and scanned, 03.04.21  - IVE consent form signed and scanned, 06.04.21  - [employer] benefits investigation started, 04.30.21 -- approved for 2023  - f/u in 5-6 wks -- DFE/OCT, possible injection  2. Age related macular degeneration, non-exudative, OD  - The incidence, anatomy, and pathology of dry AMD, risk of progression, and the AREDS and AREDS 2 study including smoking risks discussed with patient.  - recommend Amsler grid monitoring  3. Pigmentary retinopathy OU  -  peripheral pigmented CR scarring and perivascular pigment clumping / bone spicules OU  - VA remains fairly good (20/20 OD, 20/40 OS with exudative ARMD OS)  - denies night blindness  - denies family history of vision problems  - discussed findings  - broad differential for pigmentary retinopathy:  Congenital (RP), infectious (TB, lyme dx, syphilis, toxo, bartonella), autoimmune and toxic etiologies  - labs checked (4.2.21):  All essential WNL   Rheumatoid factor   CBC   Comprehensive metabolic panel     RPR   ANA   ESR, CRP   ACE   ANCA   Toxoplasma antibodies IgG / IgM   Quantiferon - TB Gold Plus   HLA, A, B, C (IR)   Fluorescent treponemal ab (fla) - IgG - bld   VDRL Serum   Bartonella antibody, 06.04.21  - [facility] -- drawn on 6.4.21                         Results Positive for AGBL5, ALMS1, OPA1 -- unknown significance             - f/u in 4-5 weeks  4,5. Hypertensive retinopathy OU  - discussed importance of tight BP control  - monitor  6. Mixed form age related cataracts OU  - The symptoms of cataract, surgical options, and treatments and risks were discussed with patient.  - discussed diagnosis and progression  - not yet visually significant  - monitor for now  Ophthalmic Meds Ordered this visit:  No orders of the defined types were placed in this encounter.     No follow-ups on file.  There are no Patient Instructions on file for this visit.  This document serves as a record of services personally performed by Gardiner Sleeper, MD, PhD. It was created on their behalf by San Jetty. Owens Shark, OA an ophthalmic technician. The creation of this record is the provider's dictation and/or activities during the visit.    Electronically signed by: San Jetty. Owens Shark, [place] 03.08.2023 9:14 AM   Gardiner Sleeper, M.D., Ph.D. Diseases & Surgery of the Retina and Vitreous [facility]     Abbreviations: M myopia (nearsighted); A astigmatism;  H hyperopia (farsighted); P presbyopia; Mrx spectacle prescription;  CTL contact lenses; OD right eye; OS left eye; OU both eyes  XT exotropia; ET esotropia; PEK punctate epithelial keratitis; PEE punctate epithelial erosions; DES dry eye syndrome; MGD meibomian gland dysfunction; ATs artificial tears; PFAT's preservative free artificial tears; [place] nuclear sclerotic cataract; PSC posterior subcapsular cataract; ERM epi-retinal membrane; PVD posterior vitreous detachment; RD retinal detachment; DM diabetes mellitus; DR diabetic retinopathy; NPDR  non-proliferative diabetic retinopathy; PDR proliferative diabetic retinopathy; CSME clinically significant macular edema; DME diabetic macular edema; dbh dot blot hemorrhages; CWS cotton wool spot; POAG primary open angle glaucoma; C/D cup-to-disc ratio; HVF humphrey visual field; GVF goldmann visual field; OCT optical coherence tomography; IOP intraocular pressure; BRVO Branch retinal vein occlusion; CRVO central retinal vein occlusion; CRAO central retinal artery occlusion; BRAO branch retinal artery occlusion; RT retinal tear; SB scleral buckle; PPV pars plana vitrectomy; VH Vitreous hemorrhage; PRP panretinal laser photocoagulation; IVK intravitreal kenalog; VMT vitreomacular traction; MH Macular hole;  NVD neovascularization of the disc; NVE neovascularization elsewhere; AREDS age related eye disease study; ARMD age related macular degeneration; POAG primary open angle glaucoma; EBMD epithelial/anterior basement membrane dystrophy; ACIOL anterior chamber intraocular lens; IOL intraocular lens; PCIOL posterior chamber intraocular lens; Phaco/IOL phacoemulsification with intraocular lens placement; [place] photorefractive keratectomy; LASIK laser assisted in situ keratomileusis; HTN hypertension; DM diabetes mellitus; COPD chronic obstructive pulmonary disease

## 2021-06-29 ENCOUNTER — Encounter (INDEPENDENT_AMBULATORY_CARE_PROVIDER_SITE_OTHER): Payer: Self-pay | Admitting: Ophthalmology

## 2021-06-29 ENCOUNTER — Ambulatory Visit (INDEPENDENT_AMBULATORY_CARE_PROVIDER_SITE_OTHER): Payer: BC Managed Care – PPO | Admitting: Ophthalmology

## 2021-06-29 ENCOUNTER — Other Ambulatory Visit: Payer: Self-pay

## 2021-06-29 DIAGNOSIS — H353112 Nonexudative age-related macular degeneration, right eye, intermediate dry stage: Secondary | ICD-10-CM

## 2021-06-29 DIAGNOSIS — H3552 Pigmentary retinal dystrophy: Secondary | ICD-10-CM | POA: Diagnosis not present

## 2021-06-29 DIAGNOSIS — H353221 Exudative age-related macular degeneration, left eye, with active choroidal neovascularization: Secondary | ICD-10-CM | POA: Diagnosis not present

## 2021-06-29 DIAGNOSIS — H25813 Combined forms of age-related cataract, bilateral: Secondary | ICD-10-CM

## 2021-06-29 DIAGNOSIS — H35033 Hypertensive retinopathy, bilateral: Secondary | ICD-10-CM | POA: Diagnosis not present

## 2021-06-29 DIAGNOSIS — I10 Essential (primary) hypertension: Secondary | ICD-10-CM | POA: Diagnosis not present

## 2021-06-29 MED ORDER — AFLIBERCEPT 2MG/0.05ML IZ SOLN FOR KALEIDOSCOPE
2.0000 mg | INTRAVITREAL | Status: AC | PRN
  Administered 2021-06-29: 2 mg via INTRAVITREAL

## 2021-07-05 DIAGNOSIS — G8918 Other acute postprocedural pain: Secondary | ICD-10-CM | POA: Diagnosis not present

## 2021-07-05 DIAGNOSIS — M958 Other specified acquired deformities of musculoskeletal system: Secondary | ICD-10-CM | POA: Diagnosis not present

## 2021-07-05 DIAGNOSIS — M23321 Other meniscus derangements, posterior horn of medial meniscus, right knee: Secondary | ICD-10-CM | POA: Diagnosis not present

## 2021-07-05 DIAGNOSIS — M23221 Derangement of posterior horn of medial meniscus due to old tear or injury, right knee: Secondary | ICD-10-CM | POA: Diagnosis not present

## 2021-07-31 NOTE — Progress Notes (Signed)
?[facility] Note ? ?08/03/2021 ?  ? ?CHIEF COMPLAINT ?Patient presents for Retina Follow Up ? ?HISTORY OF PRESENT ILLNESS: ?Eric Key is a 66 y.o. male who presents to the clinic today for:  ? ?HPI   ? ? Retina Follow Up   ?Patient presents with  Wet AMD.  In left eye.  Severity is moderate.  Duration of 5 weeks.  Since onset it is stable.  I, the attending physician,  performed the HPI with the patient and updated documentation appropriately. ? ?  ?  ? ? Comments   ?Patient states vision the same OU. ? ?  ?  ?Last edited by [name], MD on 08/03/2021  8:39 AM.  ?  ? ?Pt states no change in vision, he had knee sx on March 22 and states it is much better ? ?Referring physician: ?[name], [name], MD ?[address] ?[place],  [place] [address] ? ?HISTORICAL INFORMATION:  ? ?Selected notes from the [facility] ?Referred by Dr. [name] for concern of mac heme OS.  ? ?CURRENT MEDICATIONS: ?No current outpatient medications on file. (Ophthalmic Drugs)  ? ?No current facility-administered medications for this visit. (Ophthalmic Drugs)  ? ?Current Outpatient Medications (Other)  ?Medication Sig  ? rosuvastatin (CRESTOR) 5 MG tablet Take 1 tablet (5 mg total) by mouth daily.  ? ?No current facility-administered medications for this visit. (Other)  ? ?REVIEW OF SYSTEMS: ?ROS   ?Positive for: Eyes ?Negative for: Constitutional, Gastrointestinal, Neurological, Skin, Genitourinary, Musculoskeletal, HENT, Endocrine, Cardiovascular, Respiratory, Psychiatric, Allergic/Imm, Heme/Lymph ?Last edited by Jobe Marker, COT on 08/03/2021  7:37 AM.  ?  ? ? ?ALLERGIES ?Allergies  ?Allergen Reactions  ? Penicillins Hives  ? ?PAST MEDICAL HISTORY ?Past Medical History:  ?Diagnosis Date  ? Cataract   ? Mixed form OU  ? History of adenomatous polyps of colon 03/16/2020  ? Hypertension   ? past hx   ? Hypertensive retinopathy   ? OU  ? Macular degeneration   ? Dry OD; Wet OS  ? ?Past Surgical  History:  ?Procedure Laterality Date  ? ACHILLES TENDON REPAIR  2010  ? CHOLECYSTECTOMY    ? COLONOSCOPY    ? GALLBLADDER SURGERY    ? ?FAMILY HISTORY ?Family History  ?Problem Relation Age of Onset  ? Diabetes Brother   ? Heart disease Brother   ? Diabetes Maternal Grandfather   ? Colon polyps Neg Hx   ? Colon cancer Neg Hx   ? Esophageal cancer Neg Hx   ? Rectal cancer Neg Hx   ? Stomach cancer Neg Hx   ? ?SOCIAL HISTORY ?Social History  ? ?Tobacco Use  ? Smoking status: Some Days  ?  Types: Cigarettes, Cigars  ?  Last attempt to quit: 02/05/2020  ?  Years since quitting: 1.4  ? Smokeless tobacco: Never  ?Vaping Use  ? Vaping Use: Never used  ?Substance Use Topics  ? Alcohol use: Yes  ?  Comment: occ  ? Drug use: Never  ?  ? ?  ?OPHTHALMIC EXAM: ?Base Eye Exam   ? ? Visual Acuity (Snellen - Linear)   ? ?   Right Left  ? Dist cc 20/25 20/30  ? Dist ph cc 20/25 +2 NI  ? ? Correction: Glasses  ? ?  ?  ? ? Tonometry (Tonopen, 7:42 AM)   ? ?   Right Left  ? Pressure 10 09  ? ?  ?  ? ?  Pupils   ? ?   Dark Light Shape React APD  ? Right 3 2 Round Brisk None  ? Left 3 2 Round Brisk None  ? ?  ?  ? ? Visual Fields (Counting fingers)   ? ?   Left Right  ?  Full Full  ? ?  ?  ? ? Extraocular Movement   ? ?   Right Left  ?  Full, Ortho Full, Ortho  ? ?  ?  ? ? Neuro/Psych   ? ? Oriented x3: Yes  ? Mood/Affect: Normal  ? ?  ?  ? ? Dilation   ? ? Both eyes: 1.0% Mydriacyl, 2.5% Phenylephrine @ 7:42 AM  ? ?  ?  ? ?  ? ?Slit Lamp and Fundus Exam   ? ? Slit Lamp Exam   ? ?   Right Left  ? Lids/Lashes Dermatochalasis - upper lid Dermatochalasis - upper lid  ? Conjunctiva/Sclera White and quiet White and quiet  ? Cornea 1+ PEE, mild Arcus, mild Debris in tear film mild Arcus, mild Debris in tear film, 2-3+ Punctate epithelial erosions centrally, decreased TBUT  ? Anterior Chamber deep, clear, narrow angles deep, clear, narrow angles  ? Iris Round and moderately dilated, mild anterior bowing Round and moderately dilated, mild  anterior bowing  ? Lens 2-3+ Nuclear sclerosis, 2-3+ Cortical cataract 2-3+ Nuclear sclerosis, 2-3+ Cortical cataract  ? Anterior Vitreous Vitreous syneresis Vitreous syneresis  ? ?  ?  ? ? Fundus Exam   ? ?   Right Left  ? Disc Pink and Sharp, focal PPP ST, Compact Pink and Sharp, Compact  ? C/D Ratio 0.2 0.3  ? Macula Flat, Drusen, RPE mottling and clumping, early Atrophy, punctate IRH Abnormal foveal reflex, +CNV with heme improved, +central edema / cystic changes-- slightly increased, Drusen, RPE mottling and clumping  ? Vessels attenuated, mild tortuosity, perivascular pigment clumping peripherally attenuated, mild tortuosity, perivascular pigment clumping peripherally  ? Periphery Attached, peripheral pigmented CR scarring and perivascular bone spicules 360 Attached, peripheral pigmented CR scarring and perivascular bone spicules 360  ? ?  ?  ? ?  ? ?IMAGING AND PROCEDURES  ?Imaging and Procedures for _0 @ ? ?OCT, Retina - OU - Both Eyes   ? ?   ?Right Eye ?Quality was good. Central Foveal Thickness: 302. Progression has been stable. Findings include normal foveal contour, no IRF, no SRF, retinal drusen , outer retinal atrophy, pigment epithelial detachment (Persistent vitreous opacities; stable low lying PEDs and ORA).  ? ?Left Eye ?Quality was good. Central Foveal Thickness: 359. Progression has worsened. Findings include abnormal foveal contour, intraretinal fluid, subretinal hyper-reflective material, pigment epithelial detachment, outer retinal atrophy, retinal drusen (Mild interval increase in IRF temporal macula and fovea).  ? ?Notes ?*Images captured and stored on drive ? ?Diagnosis / Impression:  ?OD: non-exu ARMD  ?OS: exu ARMD -- mild interval increase in IRF temporal macula and fovea ? ?Clinical management:  ?See below ? ?Abbreviations: NFP - Normal foveal profile. CME - cystoid macular edema. PED - pigment epithelial detachment. IRF - intraretinal fluid. SRF - subretinal fluid. EZ - ellipsoid  zone. ERM - epiretinal membrane. ORA - outer retinal atrophy. ORT - outer retinal tubulation. SRHM - subretinal hyper-reflective material ? ? ?  ? ?Intravitreal Injection, Pharmacologic Agent - OS - Left Eye   ? ?   ?Time Out ?08/03/2021. 8:02 AM. Confirmed correct patient, procedure, site, and patient consented.  ? ?Anesthesia ?Topical anesthesia was used. Anesthetic  medications included Lidocaine 2%, Proparacaine 0.5%.  ? ?Procedure ?Preparation included 5% betadine to ocular surface, eyelid speculum. A (32g) needle was used.  ? ?Injection: ?2 mg aflibercept 2 MG/0.05ML ?  Route: Intravitreal, Site: Left Eye ?  [place]: [id number], Lot: [id number], Expiration date: 06/13/2022, Waste: 0 mL  ? ?Post-op ?Post injection exam found visual acuity of at least counting fingers. The patient tolerated the procedure well. There were no complications. The patient received written and verbal post procedure care education. Post injection medications were not given.  ? ?  ?  ?  ? ?  ?ASSESSMENT/PLAN: ?  ICD-10-CM   ?1. Exudative age-related macular degeneration of left eye with active choroidal neovascularization (HCC)  H35.3221 OCT, Retina - OU - Both Eyes  ?  Intravitreal Injection, Pharmacologic Agent - OS - Left Eye  ?  aflibercept (EYLEA) SOLN 2 mg  ?  ?2. Intermediate stage nonexudative age-related macular degeneration of right eye  H35.3112   ?  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  ?4. Essential hypertension  I10   ?  ?5. Hypertensive retinopathy of both eyes  H35.033   ?  ?6. Combined forms of age-related cataract of both eyes  H25.813   ?  ? ?1. 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, OS ? - s/p IVA OS #1 (03.04.21), #2 (03.04.21), #3 (04.30.21) -- IVA resistance ? - s/p IVE OS #1 (06.04.21), #2 (07.08.21), #3 (08.09.21), #4 (09.13.21), #5 (10.18.21), #6 (11.24.21), #7 (12.31.21), #8 (02.11.22), #9 (03.25.22), #10 (05.02.22), #11 (06.06.22), #12 (07.13.22), #13 (08.17.22), #14 (09.21.22), #15 (10.26.22), #16  (11.29.22), #17 (01.03.23), #18 (02.07.23), #19 (03.16.23) ? - pt reports stable vision OS ? - exam shows central CNV with +subretinal heme -- improved ? - BCVA 20/30 OS (dec) ? - OCT shows mild interval increase in I

## 2021-08-03 ENCOUNTER — Ambulatory Visit (INDEPENDENT_AMBULATORY_CARE_PROVIDER_SITE_OTHER): Payer: BC Managed Care – PPO | Admitting: Ophthalmology

## 2021-08-03 ENCOUNTER — Encounter (INDEPENDENT_AMBULATORY_CARE_PROVIDER_SITE_OTHER): Payer: Self-pay | Admitting: Ophthalmology

## 2021-08-03 DIAGNOSIS — H3552 Pigmentary retinal dystrophy: Secondary | ICD-10-CM

## 2021-08-03 DIAGNOSIS — H353112 Nonexudative age-related macular degeneration, right eye, intermediate dry stage: Secondary | ICD-10-CM

## 2021-08-03 DIAGNOSIS — H353221 Exudative age-related macular degeneration, left eye, with active choroidal neovascularization: Secondary | ICD-10-CM

## 2021-08-03 DIAGNOSIS — H25813 Combined forms of age-related cataract, bilateral: Secondary | ICD-10-CM

## 2021-08-03 DIAGNOSIS — H35033 Hypertensive retinopathy, bilateral: Secondary | ICD-10-CM

## 2021-08-03 DIAGNOSIS — I10 Essential (primary) hypertension: Secondary | ICD-10-CM | POA: Diagnosis not present

## 2021-08-03 MED ORDER — AFLIBERCEPT 2MG/0.05ML IZ SOLN FOR KALEIDOSCOPE
2.0000 mg | INTRAVITREAL | Status: AC | PRN
  Administered 2021-08-03: 2 mg via INTRAVITREAL

## 2021-08-24 NOTE — Progress Notes (Signed)
[facility] Note  09/01/2021    CHIEF COMPLAINT Patient presents for Retina Follow Up  HISTORY OF PRESENT ILLNESS: Eric Key is a 66 y.o. male who presents to the clinic today for:   HPI     Retina Follow Up   Patient presents with  Wet AMD.  In left eye.  Severity is moderate.  Duration of 4 weeks.  Since onset it is stable.  I, the attending physician,  performed the HPI with the patient and updated documentation appropriately.        Comments   Patient states vision the same OU.       Last edited by [name], MD on 09/01/2021 12:54 PM.    Pt states no change in vision, he had knee sx on March 22 and states it is much better  Referring physician: [name], [name], MD [place],   [id number]  HISTORICAL INFORMATION:   Selected notes from the [facility] Referred by Dr. [name] for concern of mac heme OS.   CURRENT MEDICATIONS: No current outpatient medications on file. (Ophthalmic Drugs)   No current facility-administered medications for this visit. (Ophthalmic Drugs)   Current Outpatient Medications (Other)  Medication Sig   rosuvastatin (CRESTOR) 5 MG tablet Take 1 tablet (5 mg total) by mouth daily.   No current facility-administered medications for this visit. (Other)   REVIEW OF SYSTEMS: ROS   Positive for: Eyes Negative for: Constitutional, Gastrointestinal, Neurological, Skin, Genitourinary, Musculoskeletal, HENT, Endocrine, Cardiovascular, Respiratory, Psychiatric, Allergic/Imm, Heme/Lymph Last edited by Roselee Nova D, COT on 09/01/2021  7:32 AM.     ALLERGIES Allergies  Allergen Reactions   Penicillins Hives   PAST MEDICAL HISTORY Past Medical History:  Diagnosis Date   Cataract    Mixed form OU   History of adenomatous polyps of colon 03/16/2020   Hypertension    past hx    Hypertensive retinopathy    OU   Macular degeneration    Dry OD; Wet OS   Past Surgical  History:  Procedure Laterality Date   ACHILLES TENDON REPAIR  2010   CHOLECYSTECTOMY     COLONOSCOPY     GALLBLADDER SURGERY     FAMILY HISTORY Family History  Problem Relation Age of Onset   Diabetes Brother    Heart disease Brother    Diabetes Maternal Grandfather    Colon polyps Neg Hx    Colon cancer Neg Hx    Esophageal cancer Neg Hx    Rectal cancer Neg Hx    Stomach cancer Neg Hx    SOCIAL HISTORY Social History   Tobacco Use   Smoking status: Some Days    Types: Cigarettes, Cigars    Last attempt to quit: 02/05/2020    Years since quitting: 1.5   Smokeless tobacco: Never  Vaping Use   Vaping Use: Never used  Substance Use Topics   Alcohol use: Yes    Comment: occ   Drug use: Never       OPHTHALMIC EXAM: Base Eye Exam     Visual Acuity (Snellen - Linear)       Right Left   Dist cc 20/30 +2 20/30 +1   Dist ph cc 20/20 -1 20/25    Correction: Glasses         Tonometry (Tonopen, 7:38 AM)       Right Left   Pressure 10 08  Pupils       Dark Light Shape React APD   Right 3 2 Round Brisk None   Left 3 2 Round Brisk None         Visual Fields (Counting fingers)       Left Right    Full Full         Extraocular Movement       Right Left    Full, Ortho Full, Ortho         Neuro/Psych     Oriented x3: Yes   Mood/Affect: Normal         Dilation     Both eyes: 1.0% Mydriacyl, 2.5% Phenylephrine @ 7:38 AM           Slit Lamp and Fundus Exam     Slit Lamp Exam       Right Left   Lids/Lashes Dermatochalasis - upper lid Dermatochalasis - upper lid   Conjunctiva/Sclera White and quiet White and quiet   Cornea 1+ PEE, mild Arcus, mild Debris in tear film mild Arcus, mild Debris in tear film, 2-3+ Punctate epithelial erosions centrally, decreased TBUT   Anterior Chamber deep, clear, narrow angles deep, clear, narrow angles   Iris Round and moderately dilated, mild anterior bowing Round and moderately dilated,  mild anterior bowing   Lens 2-3+ Nuclear sclerosis, 2-3+ Cortical cataract 2-3+ Nuclear sclerosis, 2-3+ Cortical cataract   Anterior Vitreous Vitreous syneresis Vitreous syneresis         Fundus Exam       Right Left   Disc Pink and Sharp, focal PPP ST, Compact Pink and Sharp, Compact   C/D Ratio 0.2 0.3   Macula Flat, Drusen, RPE mottling and clumping, early Atrophy, punctate IRH Abnormal foveal reflex, +CNV with heme improved, +central edema / cystic changes-- improved, Drusen, RPE mottling and clumping   Vessels attenuated, mild tortuosity, perivascular pigment clumping peripherally attenuated, mild tortuosity, perivascular pigment clumping peripherally   Periphery Attached, peripheral pigmented CR scarring and perivascular bone spicules 360 Attached, peripheral pigmented CR scarring and perivascular bone spicules 360           Refraction     Wearing Rx       Sphere Cylinder Axis Add   Right [place] +1.25 177 +2.25   Left [place] +0.75 170 +2.25           IMAGING AND PROCEDURES  Imaging and Procedures for _0 @  OCT, Retina - OU - Both Eyes       Right Eye Quality was good. Central Foveal Thickness: 305. Progression has been stable. Findings include normal foveal contour, no IRF, no SRF, retinal drusen , outer retinal atrophy, pigment epithelial detachment (Persistent vitreous opacities; stable low lying PEDs and ORA).   Left Eye Quality was good. Central Foveal Thickness: 368. Progression has improved. Findings include abnormal foveal contour, intraretinal fluid, subretinal hyper-reflective material, pigment epithelial detachment, outer retinal atrophy, retinal drusen (Mild interval improvement in IRF and foveal contour).   Notes *Images captured and stored on drive  Diagnosis / Impression:  OD: non-exu ARMD  OS: exu ARMD -- Mild interval improvement in IRF and foveal contour  Clinical management:  See below  Abbreviations: NFP - Normal foveal profile. CME -  cystoid macular edema. PED - pigment epithelial detachment. IRF - intraretinal fluid. SRF - subretinal fluid. EZ - ellipsoid zone. ERM - epiretinal membrane. ORA - outer retinal atrophy. ORT - outer retinal tubulation. SRHM - subretinal hyper-reflective material  Intravitreal Injection, Pharmacologic Agent - OS - Left Eye       Time Out 09/01/2021. 8:00 AM. Confirmed correct patient, procedure, site, and patient consented.   Anesthesia Topical anesthesia was used. Anesthetic medications included Lidocaine 2%, Proparacaine 0.5%.   Procedure Preparation included 5% betadine to ocular surface, eyelid speculum. A (32g) needle was used.   Injection: 2 mg aflibercept 2 MG/0.05ML   Route: Intravitreal, Site: Left Eye   [facility]: [id number], Lot: [id number], Expiration date: 07/15/2022, Waste: 0 mL   Post-op Post injection exam found visual acuity of at least counting fingers. The patient tolerated the procedure well. There were no complications. The patient received written and verbal post procedure care education. Post injection medications were not given.            ASSESSMENT/PLAN:   ICD-10-CM   1. Exudative age-related macular degeneration of left eye with active choroidal neovascularization (HCC)  H35.3221 OCT, Retina - OU - Both Eyes    Intravitreal Injection, Pharmacologic Agent - OS - Left Eye    aflibercept (EYLEA) SOLN 2 mg    2. 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 Essential hypertension  I10     5. Hypertensive retinopathy of both eyes  H35.033     6. Combined forms of age-related cataract of both eyes  H25.813      1. 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, OS  - s/p IVA OS #1 (03.04.21), #2 (03.04.21), #3 (04.30.21) -- IVA resistance  - s/p IVE OS #1 (06.04.21), #2 (07.08.21), #3 (08.09.21), #4 (09.13.21), #5 (10.18.21), #6 (11.24.21), #7 (12.31.21), #8  (02.11.22), #9 (03.25.22), #10 (05.02.22), #11 (06.06.22), #12 (07.13.22), #13 (08.17.22), #14 (09.21.22), #15 (10.26.22), #16 (11.29.22), #17 (01.03.23), #18 (02.07.23), #19 (03.16.23), #20 (04.20.23)  - pt reports stable vision OS  - exam shows central CNV with +subretinal heme -- improved  - BCVA 20/30 OS (dec)  - OCT shows mild interval improvement in IRF temporal macula and fovea at 4+ wks  - recommend IVE OS #20 today, 04.20.23, w/ f/u in 4-5 wks  - pt wishes to proceed  - RBA of procedure discussed, questions answered  - informed consent obtained and signed  - see procedure note  - IVA consent form signed and scanned, 03.04.21  - IVE consent form re-signed and scanned, 04.20.23  - Eylea[employer]U benefits investigation started, 04.30.21 -- approved for 2023  - f/u in 4-5 wks -- DFE/OCT, possible injection  2. Age related macular degeneration, non-exudative, OD  - The incidence, anatomy, and pathology of dry AMD, risk of progression, and the AREDS and AREDS 2 study including smoking risks discussed with patient.   - recommend Amsler grid monitoring  3. Pigmentary retinopathy OU  - peripheral pigmented CR scarring and perivascular pigment clumping / bone spicules OU  - VA remains fairly good (20/20 OD, 20/40 OS with exudative ARMD OS)  - denies night blindness  - denies family history of vision problems  - discussed findings  - broad differential for pigmentary retinopathy:  Congenital (RP), infectious (TB, lyme dx, syphilis, toxo, bartonella), autoimmune and toxic etiologies  - labs checked (4.2.21):  All essential WNL   Rheumatoid factor   CBC   Comprehensive metabolic panel     RPR   ANA   ESR, CRP   ACE   ANCA   Toxoplasma antibodies IgG / IgM   Quantiferon - TB Gold Plus  HLA, A, B, C (IR)   Fluorescent treponemal ab (fla) - IgG - bld   VDRL Serum   Bartonella antibody, 06.04.21  - [facility] -- drawn on 6.4.21                         Results Positive for  AGBL5, ALMS1, OPA1 -- unknown significance              - f/u in 4-5 weeks  4,5. Hypertensive retinopathy OU  - discussed importance of tight BP control  - monitor   6. Mixed form age related cataracts OU  - The symptoms of cataract, surgical options, and treatments and risks were discussed with patient.  - discussed diagnosis and progression  - not yet visually significant  - monitor for now  Ophthalmic Meds Ordered this visit:  Meds ordered this encounter  Medications   aflibercept (EYLEA) SOLN 2 mg     Return for f/u 4-5 weeks, exu ARMD OS, DFE, OCT.  There are no Patient Instructions on file for this visit.  This document serves as a record of services personally performed by Gardiner Sleeper, MD, PhD. It was created on their behalf by Leonie Douglas, an ophthalmic technician. The creation of this record is the provider's dictation and/or activities during the visit.    Electronically signed by: Leonie Douglas COA, 09/01/21  1:05 PM  This document serves as a record of services personally performed by Gardiner Sleeper, MD, PhD. It was created on their behalf by San Jetty. Owens Shark, OA an ophthalmic technician. The creation of this record is the provider's dictation and/or activities during the visit.    Electronically signed by: San Jetty. Owens Shark, [place] 05.18.2023 1:05 PM  Gardiner Sleeper, M.D., Ph.D. Diseases & Surgery of the Retina and Vitreous [facility]  I have reviewed the above documentation for accuracy and completeness, and I agree with the above. Gardiner Sleeper, M.D., Ph.D. 09/01/21 1:05 PM  Abbreviations: M myopia (nearsighted); A astigmatism; H hyperopia (farsighted); P presbyopia; Mrx spectacle prescription;  CTL contact lenses; OD right eye; OS left eye; OU both eyes  XT exotropia; ET esotropia; PEK punctate epithelial keratitis; PEE punctate epithelial erosions; DES dry eye syndrome; MGD meibomian gland dysfunction; ATs artificial tears; PFAT's  preservative free artificial tears; [place] nuclear sclerotic cataract; PSC posterior subcapsular cataract; ERM epi-retinal membrane; PVD posterior vitreous detachment; RD retinal detachment; DM diabetes mellitus; DR diabetic retinopathy; NPDR non-proliferative diabetic retinopathy; PDR proliferative diabetic retinopathy; CSME clinically significant macular edema; DME diabetic macular edema; dbh dot blot hemorrhages; CWS cotton wool spot; POAG primary open angle glaucoma; C/D cup-to-disc ratio; HVF humphrey visual field; GVF goldmann visual field; OCT optical coherence tomography; IOP intraocular pressure; BRVO Branch retinal vein occlusion; CRVO central retinal vein occlusion; CRAO central retinal artery occlusion; BRAO branch retinal artery occlusion; RT retinal tear; SB scleral buckle; PPV pars plana vitrectomy; VH Vitreous hemorrhage; PRP panretinal laser photocoagulation; IVK intravitreal kenalog; VMT vitreomacular traction; MH Macular hole;  NVD neovascularization of the disc; NVE neovascularization elsewhere; AREDS age related eye disease study; ARMD age related macular degeneration; POAG primary open angle glaucoma; EBMD epithelial/anterior basement membrane dystrophy; ACIOL anterior chamber intraocular lens; IOL intraocular lens; PCIOL posterior chamber intraocular lens; Phaco/IOL phacoemulsification with intraocular lens placement; [place] photorefractive keratectomy; LASIK laser assisted in situ keratomileusis; HTN hypertension; DM diabetes mellitus; COPD chronic obstructive pulmonary disease

## 2021-09-01 ENCOUNTER — Encounter (INDEPENDENT_AMBULATORY_CARE_PROVIDER_SITE_OTHER): Payer: Self-pay | Admitting: Ophthalmology

## 2021-09-01 ENCOUNTER — Ambulatory Visit (INDEPENDENT_AMBULATORY_CARE_PROVIDER_SITE_OTHER): Payer: BC Managed Care – PPO | Admitting: Ophthalmology

## 2021-09-01 DIAGNOSIS — H353221 Exudative age-related macular degeneration, left eye, with active choroidal neovascularization: Secondary | ICD-10-CM

## 2021-09-01 DIAGNOSIS — H3552 Pigmentary retinal dystrophy: Secondary | ICD-10-CM

## 2021-09-01 DIAGNOSIS — I10 Essential (primary) hypertension: Secondary | ICD-10-CM

## 2021-09-01 DIAGNOSIS — H353112 Nonexudative age-related macular degeneration, right eye, intermediate dry stage: Secondary | ICD-10-CM

## 2021-09-01 DIAGNOSIS — H25813 Combined forms of age-related cataract, bilateral: Secondary | ICD-10-CM

## 2021-09-01 DIAGNOSIS — H35033 Hypertensive retinopathy, bilateral: Secondary | ICD-10-CM | POA: Diagnosis not present

## 2021-09-01 MED ORDER — AFLIBERCEPT 2MG/0.05ML IZ SOLN FOR KALEIDOSCOPE
2.0000 mg | INTRAVITREAL | Status: AC | PRN
  Administered 2021-09-01: 2 mg via INTRAVITREAL

## 2021-09-15 DIAGNOSIS — M25561 Pain in right knee: Secondary | ICD-10-CM | POA: Diagnosis not present

## 2021-09-19 NOTE — Progress Notes (Signed)
[facility] Note  10/03/2021    CHIEF COMPLAINT Patient presents for Retina Follow Up  HISTORY OF PRESENT ILLNESS: Eric Key is a 66 y.o. male who presents to the clinic today for:   HPI     Retina Follow Up   Patient presents with  Wet AMD.  In left eye.  This started 4.5 weeks ago.  I, the attending physician,  performed the HPI with the patient and updated documentation appropriately.        Comments   Patient here for 4.5 weeks retina follow up for exu ARMD OS. Patient states vision doing normal. No eye pain.       Last edited by [name], MD on 10/03/2021 11:36 AM.     Pt states no change in vision  Referring physician: [name], [name], MD [place],  [place] [address]  HISTORICAL INFORMATION:   Selected notes from the MEDICAL RECORD NUMBER Referred by Dr. [name] for concern of mac heme OS.   CURRENT MEDICATIONS: No current outpatient medications on file. (Ophthalmic Drugs)   No current facility-administered medications for this visit. (Ophthalmic Drugs)   Current Outpatient Medications (Other)  Medication Sig   rosuvastatin (CRESTOR) 5 MG tablet Take 1 tablet by mouth once daily   No current facility-administered medications for this visit. (Other)   REVIEW OF SYSTEMS: ROS   Positive for: Eyes Negative for: Constitutional, Gastrointestinal, Neurological, Skin, Genitourinary, Musculoskeletal, HENT, Endocrine, Cardiovascular, Respiratory, Psychiatric, Allergic/Imm, Heme/Lymph Last edited by Theodore Demark, COA on 10/03/2021  7:58 AM.     ALLERGIES Allergies  Allergen Reactions   Penicillins Hives   PAST MEDICAL HISTORY Past Medical History:  Diagnosis Date   Cataract    Mixed form OU   History of adenomatous polyps of colon 03/16/2020   Hypertension    past hx    Hypertensive retinopathy    OU   Macular degeneration    Dry OD; Wet OS   Past Surgical History:  Procedure Laterality  Date   ACHILLES TENDON REPAIR  2010   CHOLECYSTECTOMY     COLONOSCOPY     GALLBLADDER SURGERY     FAMILY HISTORY Family History  Problem Relation Age of Onset   Diabetes Brother    Heart disease Brother    Diabetes Maternal Grandfather    Colon polyps Neg Hx    Colon cancer Neg Hx    Esophageal cancer Neg Hx    Rectal cancer Neg Hx    Stomach cancer Neg Hx    SOCIAL HISTORY Social History   Tobacco Use   Smoking status: Some Days    Types: Cigarettes, Cigars    Last attempt to quit: 02/05/2020    Years since quitting: 1.6   Smokeless tobacco: Never  Vaping Use   Vaping Use: Never used  Substance Use Topics   Alcohol use: Yes    Comment: occ   Drug use: Never       OPHTHALMIC EXAM: Base Eye Exam     Visual Acuity (Snellen - Linear)       Right Left   Dist cc 20/25 -1 20/30 -2   Dist ph cc NI NI    Correction: Glasses         Tonometry (Tonopen, 7:56 AM)       Right Left   Pressure 19 21         Pupils       Dark  Light Shape React APD   Right 3 2 Round Brisk None   Left 3 2 Round Brisk None         Visual Fields (Counting fingers)       Left Right    Full Full         Extraocular Movement       Right Left    Full, Ortho Full, Ortho         Neuro/Psych     Oriented x3: Yes   Mood/Affect: Normal         Dilation     Both eyes: 1.0% Mydriacyl, 2.5% Phenylephrine @ 7:55 AM           Slit Lamp and Fundus Exam     Slit Lamp Exam       Right Left   Lids/Lashes Dermatochalasis - upper lid Dermatochalasis - upper lid   Conjunctiva/Sclera White and quiet White and quiet   Cornea 1+ PEE, mild Arcus, mild Debris in tear film mild Arcus, mild Debris in tear film, 2-3+ Punctate epithelial erosions centrally, decreased TBUT   Anterior Chamber deep, clear, narrow angles deep, clear, narrow angles   Iris Round and moderately dilated, mild anterior bowing Round and moderately dilated, mild anterior bowing   Lens 2-3+ Nuclear  sclerosis, 2-3+ Cortical cataract 2-3+ Nuclear sclerosis, 2-3+ Cortical cataract   Anterior Vitreous Vitreous syneresis Vitreous syneresis         Fundus Exam       Right Left   Disc Pink and Sharp, focal PPP ST, Compact Pink and Sharp, Compact   C/D Ratio 0.2 0.3   Macula Flat, Drusen, RPE mottling and clumping, early Atrophy, punctate IRH Abnormal foveal reflex, +CNV with heme improved, +central edema / cystic changes-- improved, Drusen, RPE mottling and clumping   Vessels attenuated, mild tortuosity, perivascular pigment clumping peripherally attenuated, mild tortuosity, perivascular pigment clumping peripherally   Periphery Attached, peripheral pigmented CR scarring and perivascular bone spicules 360 Attached, peripheral pigmented CR scarring and perivascular bone spicules 360           Refraction     Wearing Rx       Sphere Cylinder Axis Add   Right [place] +1.25 177 +2.25   Left [place] +0.75 170 +2.25           IMAGING AND PROCEDURES  Imaging and Procedures for _0 @  OCT, Retina - OU - Both Eyes       Right Eye Quality was good. Central Foveal Thickness: 307. Progression has been stable. Findings include normal foveal contour, no IRF, no SRF, retinal drusen , pigment epithelial detachment, outer retinal atrophy (Persistent vitreous opacities; stable low lying PEDs and ORA).   Left Eye Quality was borderline. Central Foveal Thickness: 329. Progression has improved. Findings include abnormal foveal contour, retinal drusen , subretinal hyper-reflective material, intraretinal fluid, pigment epithelial detachment, outer retinal atrophy (Mild interval improvement in IRF and foveal contour).   Notes *Images captured and stored on drive  Diagnosis / Impression:  OD: non-exu ARMD  OS: exu ARMD -- Mild interval improvement in IRF and foveal contour  Clinical management:  See below  Abbreviations: NFP - Normal foveal profile. CME - cystoid macular edema. PED -  pigment epithelial detachment. IRF - intraretinal fluid. SRF - subretinal fluid. EZ - ellipsoid zone. ERM - epiretinal membrane. ORA - outer retinal atrophy. ORT - outer retinal tubulation. SRHM - subretinal hyper-reflective material      Intravitreal Injection, Pharmacologic Agent -  OS - Left Eye       Time Out 10/03/2021. 7:52 AM. Confirmed correct patient, procedure, site, and patient consented.   Anesthesia Topical anesthesia was used. Anesthetic medications included Lidocaine 2%, Proparacaine 0.5%.   Procedure Preparation included 5% betadine to ocular surface, eyelid speculum. A (32g) needle was used.   Injection: 2 mg aflibercept 2 MG/0.05ML   Route: Intravitreal, Site: Left Eye   NDC: 76160-737-10, Lot: [id number], Expiration date: 08/12/2022, Waste: 0 mL   Post-op Post injection exam found visual acuity of at least counting fingers. The patient tolerated the procedure well. There were no complications. The patient received written and verbal post procedure care education. Post injection medications were not given.   Notes **SAMPLE MEDICATION ADMINISTERED**            ASSESSMENT/PLAN:   ICD-10-CM   1. Exudative age-related macular degeneration of left eye with active choroidal neovascularization (HCC)  H35.3221 OCT, Retina - OU - Both Eyes    Intravitreal Injection, Pharmacologic Agent - OS - Left Eye    aflibercept (EYLEA) SOLN 2 mg    2. 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 Essential hypertension  I10     5. Hypertensive retinopathy of both eyes  H35.033     6. Combined forms of age-related cataract of both eyes  H25.813       1. 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, OS  - s/p IVA OS #1 (03.04.21), #2 (03.04.21), #3 (04.30.21) -- IVA resistance  - s/p IVE OS #1 (06.04.21), #2 (07.08.21), #3 (08.09.21), #4 (09.13.21), #5  (10.18.21), #6 (11.24.21), #7 (12.31.21), #8 (02.11.22), #9 (03.25.22), #10 (05.02.22), #11 (06.06.22), #12 (07.13.22), #13 (08.17.22), #14 (09.21.22), #15 (10.26.22), #16 (11.29.22), #17 (01.03.23), #18 (02.07.23), #19 (03.16.23), #20 (04.20.23)  - pt reports stable vision OS  - exam shows central CNV with +subretinal heme -- improved  - BCVA 20/30 OS (stable)  - OCT shows mild interval improvement in IRF temporal macula and fovea at 4+ wks  - recommend IVE OS #21 today, 06.20.23, w/ f/u in 4-5 wks -- SAMPLE due to expired authorization  - pt wishes to proceed  - RBA of procedure discussed, questions answered  - informed consent obtained and signed  - see procedure note  - IVA consent form signed and scanned, 03.04.21  - IVE consent form re-signed and scanned, 04.20.23  - [employer] benefits investigation started, 04.30.21 -- prior authorization expired and pending as of June 2023  - f/u in 4-5 wks -- DFE/OCT, possible injection  2. Age related macular degeneration, non-exudative, OD  - The incidence, anatomy, and pathology of dry AMD, risk of progression, and the AREDS and AREDS 2 study including smoking risks discussed with patient.   - recommend Amsler grid monitoring  3. Pigmentary retinopathy OU  - peripheral pigmented CR scarring and perivascular pigment clumping / bone spicules OU  - VA remains fairly good (20/20 OD, 20/40 OS with exudative ARMD OS)  - denies night blindness  - denies family history of vision problems  - discussed findings  - broad differential for pigmentary retinopathy:  Congenital (RP), infectious (TB, lyme dx, syphilis, toxo, bartonella), autoimmune and toxic etiologies  - labs checked (4.2.21):  All essential WNL   Rheumatoid factor   CBC   Comprehensive metabolic panel     RPR   ANA   ESR, CRP  ACE   ANCA   Toxoplasma antibodies IgG / IgM   Quantiferon - TB Gold Plus   HLA, A, B, C (IR)   Fluorescent treponemal ab (fla) - IgG - bld   VDRL  Serum   Bartonella antibody, 06.04.21  - [facility] -- drawn on 6.4.21                         Results Positive for AGBL5, ALMS1, OPA1 -- unknown significance              - f/u in 4-5 weeks  4,5. Hypertensive retinopathy OU  - discussed importance of tight BP control  - monitor  6. Mixed form age related cataracts OU  - The symptoms of cataract, surgical options, and treatments and risks were discussed with patient.  - discussed diagnosis and progression  - not yet visually significant  - monitor  Ophthalmic Meds Ordered this visit:  Meds ordered this encounter  Medications   aflibercept (EYLEA) SOLN 2 mg     Return for f/u 4-5 weeks, exu ARMD OS, DFE, OCT.  There are no Patient Instructions on file for this visit.  This document serves as a record of services personally performed by Gardiner Sleeper, MD, PhD. It was created on their behalf by Renaldo Reel, [place] an ophthalmic technician. The creation of this record is the provider's dictation and/or activities during the visit.    Electronically signed by:  Renaldo Reel, COT  09/19/21 11:41 AM   This document serves as a record of services personally performed by Gardiner Sleeper, MD, PhD. It was created on their behalf by San Jetty. Owens Shark, OA an ophthalmic technician. The creation of this record is the provider's dictation and/or activities during the visit.    Electronically signed by: San Jetty. Owens Shark, [place] 06.20.2023 11:41 AM   Gardiner Sleeper, M.D., Ph.D. Diseases & Surgery of the Retina and Vitreous [facility]  I have reviewed the above documentation for accuracy and completeness, and I agree with the above. Gardiner Sleeper, M.D., Ph.D. 10/03/21 11:41 AM   Abbreviations: M myopia (nearsighted); A astigmatism; H hyperopia (farsighted); P presbyopia; Mrx spectacle prescription;  CTL contact lenses; OD right eye; OS left eye; OU both eyes  XT exotropia; ET esotropia; PEK punctate  epithelial keratitis; PEE punctate epithelial erosions; DES dry eye syndrome; MGD meibomian gland dysfunction; ATs artificial tears; PFAT's preservative free artificial tears; [place] nuclear sclerotic cataract; PSC posterior subcapsular cataract; ERM epi-retinal membrane; PVD posterior vitreous detachment; RD retinal detachment; DM diabetes mellitus; DR diabetic retinopathy; NPDR non-proliferative diabetic retinopathy; PDR proliferative diabetic retinopathy; CSME clinically significant macular edema; DME diabetic macular edema; dbh dot blot hemorrhages; CWS cotton wool spot; POAG primary open angle glaucoma; C/D cup-to-disc ratio; HVF humphrey visual field; GVF goldmann visual field; OCT optical coherence tomography; IOP intraocular pressure; BRVO Branch retinal vein occlusion; CRVO central retinal vein occlusion; CRAO central retinal artery occlusion; BRAO branch retinal artery occlusion; RT retinal tear; SB scleral buckle; PPV pars plana vitrectomy; VH Vitreous hemorrhage; PRP panretinal laser photocoagulation; IVK intravitreal kenalog; VMT vitreomacular traction; MH Macular hole;  NVD neovascularization of the disc; NVE neovascularization elsewhere; AREDS age related eye disease study; ARMD age related macular degeneration; POAG primary open angle glaucoma; EBMD epithelial/anterior basement membrane dystrophy; ACIOL anterior chamber intraocular lens; IOL intraocular lens; PCIOL posterior chamber intraocular lens; Phaco/IOL phacoemulsification with intraocular lens placement; PRK photorefractive keratectomy; LASIK laser assisted  in situ keratomileusis; HTN hypertension; DM diabetes mellitus; COPD chronic obstructive pulmonary disease

## 2021-09-21 ENCOUNTER — Other Ambulatory Visit: Payer: Self-pay | Admitting: Family Medicine

## 2021-10-03 ENCOUNTER — Ambulatory Visit (INDEPENDENT_AMBULATORY_CARE_PROVIDER_SITE_OTHER): Payer: BC Managed Care – PPO | Admitting: Ophthalmology

## 2021-10-03 ENCOUNTER — Encounter (INDEPENDENT_AMBULATORY_CARE_PROVIDER_SITE_OTHER): Payer: Self-pay | Admitting: Ophthalmology

## 2021-10-03 DIAGNOSIS — H353221 Exudative age-related macular degeneration, left eye, with active choroidal neovascularization: Secondary | ICD-10-CM

## 2021-10-03 DIAGNOSIS — H25813 Combined forms of age-related cataract, bilateral: Secondary | ICD-10-CM

## 2021-10-03 DIAGNOSIS — H3552 Pigmentary retinal dystrophy: Secondary | ICD-10-CM | POA: Diagnosis not present

## 2021-10-03 DIAGNOSIS — I10 Essential (primary) hypertension: Secondary | ICD-10-CM | POA: Diagnosis not present

## 2021-10-03 DIAGNOSIS — H353112 Nonexudative age-related macular degeneration, right eye, intermediate dry stage: Secondary | ICD-10-CM

## 2021-10-03 DIAGNOSIS — H35033 Hypertensive retinopathy, bilateral: Secondary | ICD-10-CM | POA: Diagnosis not present

## 2021-10-03 MED ORDER — AFLIBERCEPT 2MG/0.05ML IZ SOLN FOR KALEIDOSCOPE
2.0000 mg | INTRAVITREAL | Status: AC | PRN
  Administered 2021-10-03: 2 mg via INTRAVITREAL

## 2021-10-04 DIAGNOSIS — M25561 Pain in right knee: Secondary | ICD-10-CM | POA: Diagnosis not present

## 2021-10-04 DIAGNOSIS — M170 Bilateral primary osteoarthritis of knee: Secondary | ICD-10-CM | POA: Diagnosis not present

## 2021-10-18 NOTE — Progress Notes (Signed)
[facility] Note  10/31/2021    CHIEF COMPLAINT Patient presents for Retina Follow Up  HISTORY OF PRESENT ILLNESS: Eric Key is a 66 y.o. male who presents to the clinic today for:   HPI     Retina Follow Up   Patient presents with  Wet AMD.  In left eye.  Severity is moderate.  Duration of 4 weeks.  Since onset it is stable.  I, the attending physician,  performed the HPI with the patient and updated documentation appropriately.        Comments   Patient states vision the same OU.      Last edited by [name], MD on 10/31/2021  8:26 AM.     Pt states no change in vision  Referring physician: [name], [name], MD [place],  [place] [address]  HISTORICAL INFORMATION:   Selected notes from the MEDICAL RECORD NUMBER Referred by Dr. [name] for concern of mac heme OS.   CURRENT MEDICATIONS: No current outpatient medications on file. (Ophthalmic Drugs)   No current facility-administered medications for this visit. (Ophthalmic Drugs)   Current Outpatient Medications (Other)  Medication Sig   rosuvastatin (CRESTOR) 5 MG tablet Take 1 tablet by mouth once daily   No current facility-administered medications for this visit. (Other)   REVIEW OF SYSTEMS: ROS   Positive for: Eyes Negative for: Constitutional, Gastrointestinal, Neurological, Skin, Genitourinary, Musculoskeletal, HENT, Endocrine, Cardiovascular, Respiratory, Psychiatric, Allergic/Imm, Heme/Lymph Last edited by Roselee Nova D, COT on 10/31/2021  7:33 AM.     ALLERGIES Allergies  Allergen Reactions   Penicillins Hives   PAST MEDICAL HISTORY Past Medical History:  Diagnosis Date   Cataract    Mixed form OU   History of adenomatous polyps of colon 03/16/2020   Hypertension    past hx    Hypertensive retinopathy    OU   Macular degeneration    Dry OD; Wet OS   Past Surgical History:  Procedure Laterality Date   ACHILLES TENDON REPAIR  2010    CHOLECYSTECTOMY     COLONOSCOPY     GALLBLADDER SURGERY     FAMILY HISTORY Family History  Problem Relation Age of Onset   Diabetes Brother    Heart disease Brother    Diabetes Maternal Grandfather    Colon polyps Neg Hx    Colon cancer Neg Hx    Esophageal cancer Neg Hx    Rectal cancer Neg Hx    Stomach cancer Neg Hx    SOCIAL HISTORY Social History   Tobacco Use   Smoking status: Some Days    Types: Cigarettes, Cigars    Last attempt to quit: 02/05/2020    Years since quitting: 1.7   Smokeless tobacco: Never  Vaping Use   Vaping Use: Never used  Substance Use Topics   Alcohol use: Yes    Comment: occ   Drug use: Never       OPHTHALMIC EXAM: Base Eye Exam     Visual Acuity (Snellen - Linear)       Right Left   Dist cc 20/20 -1 20/25 -2   Dist ph cc  NI    Correction: Glasses         Tonometry (Tonopen, 7:38 AM)       Right Left   Pressure 13 12         Pupils       Dark Light Shape React APD  Right 3 2 Round Brisk None   Left 3 2 Round Brisk None         Visual Fields (Counting fingers)       Left Right    Full Full         Extraocular Movement       Right Left    Full, Ortho Full, Ortho         Neuro/Psych     Oriented x3: Yes   Mood/Affect: Normal         Dilation     Both eyes: 1.0% Mydriacyl, 2.5% Phenylephrine @ 7:39 AM           Slit Lamp and Fundus Exam     Slit Lamp Exam       Right Left   Lids/Lashes Dermatochalasis - upper lid Dermatochalasis - upper lid   Conjunctiva/Sclera White and quiet White and quiet   Cornea 1+ PEE, mild Arcus, mild Debris in tear film mild Arcus, mild Debris in tear film, 2-3+ Punctate epithelial erosions centrally, decreased TBUT   Anterior Chamber deep, clear, narrow angles deep, clear, narrow angles   Iris Round and moderately dilated, mild anterior bowing Round and moderately dilated, mild anterior bowing   Lens 2-3+ Nuclear sclerosis, 2-3+ Cortical cataract 2-3+  Nuclear sclerosis, 2-3+ Cortical cataract   Anterior Vitreous Vitreous syneresis Vitreous syneresis         Fundus Exam       Right Left   Disc Pink and Sharp, focal PPP ST, Compact Pink and Sharp, Compact   C/D Ratio 0.2 0.3   Macula Flat, Drusen, RPE mottling and clumping, early Atrophy, punctate IRH Abnormal foveal reflex, +CNV with heme improved, +central edema / cystic changes -- persistent / slightly improved, Drusen, RPE mottling and clumping   Vessels attenuated, mild tortuosity, perivascular pigment clumping peripherally attenuated, mild tortuosity, perivascular pigment clumping peripherally   Periphery Attached, peripheral pigmented CR scarring and perivascular bone spicules 360 Attached, peripheral pigmented CR scarring and perivascular bone spicules 360           Refraction     Wearing Rx       Sphere Cylinder Axis Add   Right [place] +1.25 177 +2.25   Left [place] +0.75 170 +2.25           IMAGING AND PROCEDURES  Imaging and Procedures for _0 @  OCT, Retina - OU - Both Eyes       Right Eye Quality was borderline. Central Foveal Thickness: 305. Progression has been stable. Findings include normal foveal contour, no IRF, no SRF, retinal drusen , pigment epithelial detachment, outer retinal atrophy (Persistent vitreous opacities; stable low lying PEDs and ORA).   Left Eye Quality was borderline. Central Foveal Thickness: 315. Progression has improved. Findings include abnormal foveal contour, retinal drusen , subretinal hyper-reflective material, intraretinal fluid, pigment epithelial detachment, outer retinal atrophy (Persistent IRF temporal fovea -- ?slight improvement).   Notes *Images captured and stored on drive  Diagnosis / Impression:  OD: non-exu ARMD w/ stable low lying PEDs and ORA OS: exu ARMD -- Persistent IRF temporal fovea -- ?slight improvement  Clinical management:  See below  Abbreviations: NFP - Normal foveal profile. CME - cystoid  macular edema. PED - pigment epithelial detachment. IRF - intraretinal fluid. SRF - subretinal fluid. EZ - ellipsoid zone. ERM - epiretinal membrane. ORA - outer retinal atrophy. ORT - outer retinal tubulation. SRHM - subretinal hyper-reflective material      Intravitreal Injection, Pharmacologic  Agent - OS - Left Eye       Time Out 10/31/2021. 7:58 AM. Confirmed correct patient, procedure, site, and patient consented.   Anesthesia Topical anesthesia was used. Anesthetic medications included Lidocaine 2%, Proparacaine 0.5%.   Procedure Preparation included 5% betadine to ocular surface, eyelid speculum. A (32g) needle was used.   Injection: 6 mg faricimab-svoa 6 MG/0.05ML   Route: Intravitreal, Site: Left Eye   NDC: [id number], Lot: [id number], Expiration date: 11/14/2022, Waste: 0 mL   Post-op Post injection exam found visual acuity of at least counting fingers. The patient tolerated the procedure well. There were no complications. The patient received written and verbal post procedure care education. Post injection medications were not given.            ASSESSMENT/PLAN:   ICD-10-CM   1. Exudative age-related macular degeneration of left eye with active choroidal neovascularization (HCC)  H35.3221 OCT, Retina - OU - Both Eyes    Intravitreal Injection, Pharmacologic Agent - OS - Left Eye    faricimab-svoa (VABYSMO) 37m/0.05mL intravitreal injection    2. 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 Essential hypertension  I10     5. Hypertensive retinopathy of both eyes  H35.033     6. Combined forms of age-related cataract of both eyes  H25.813      1. 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, OS  - s/p IVA OS #1 (03.04.21), #2 (03.04.21), #3 (04.30.21) -- IVA resistance  - s/p IVE OS #1 (06.04.21), #2 (07.08.21), #3 (08.09.21), #4 (09.13.21), #5 (10.18.21), #6 (11.24.21), #7  (12.31.21), #8 (02.11.22), #9 (03.25.22), #10 (05.02.22), #11 (06.06.22), #12 (07.13.22), #13 (08.17.22), #14 (09.21.22), #15 (10.26.22), #16 (11.29.22), #17 (01.03.23), #18 (02.07.23), #19 (03.16.23), #20 (04.20.23), #21 (05.19.23), #22 (06.20.23)  - pt reports stable vision OS  - exam shows central CNV with +subretinal heme -- improved  - BCVA 20/25 OS (improved)  - OCT shows persistent IRF temporal fovea -- slight improvement at 4 wks  - switching to Vabysmo due to [employer] insurance authorization  - recommend IVV OS #1 today, 07.18.23, w/ f/u in 4 wks  - pt wishes to proceed  - RBA of procedure discussed, questions answered  - informed consent obtained and signed  - see procedure note  - IVA consent form signed and scanned, 03.04.21  - IVE consent form re-signed and scanned, 04.20.23  - IVV consent form signed and scanned, 07.18.23  - f/u in 4 wks -- DFE/OCT, possible injection  2. Age related macular degeneration, non-exudative, OD  - The incidence, anatomy, and pathology of dry AMD, risk of progression, and the AREDS and AREDS 2 study including smoking risks discussed with patient.   - recommend Amsler grid monitoring  3. Pigmentary retinopathy OU  - peripheral pigmented CR scarring and perivascular pigment clumping / bone spicules OU  - VA remains fairly good (20/20 OD, 20/40 OS with exudative ARMD OS)  - denies night blindness  - denies family history of vision problems  - discussed findings  - broad differential for pigmentary retinopathy:  Congenital (RP), infectious (TB, lyme dx, syphilis, toxo, bartonella), autoimmune and toxic etiologies  - labs checked (4.2.21):  All essential WNL   Rheumatoid factor   CBC   Comprehensive metabolic panel     RPR   ANA   ESR, CRP   ACE   ANCA   Toxoplasma antibodies IgG /  IgM   Quantiferon - TB Gold Plus   HLA, A, B, C (IR)   Fluorescent treponemal ab (fla) - IgG - bld   VDRL Serum   Bartonella antibody, 06.04.21  -  [facility] -- drawn on 6.4.21                         Results Positive for AGBL5, ALMS1, OPA1 -- unknown significance              - f/u in 4-5 weeks  4,5. Hypertensive retinopathy OU  - discussed importance of tight BP control  - monitor  6. Mixed form age related cataracts OU  - The symptoms of cataract, surgical options, and treatments and risks were discussed with patient.  - discussed diagnosis and progression  - not yet visually significant  - monitor  Ophthalmic Meds Ordered this visit:  Meds ordered this encounter  Medications   faricimab-svoa (VABYSMO) 22m/0.05mL intravitreal injection     Return in about 4 weeks (around 11/28/2021) for f/u exu ARMD OS, DFE, OCT.  There are no Patient Instructions on file for this visit.  This document serves as a record of services personally performed by BGardiner Sleeper MD, PhD. It was created on their behalf by CRenaldo Reel CEversonan ophthalmic technician. The creation of this record is the provider's dictation and/or activities during the visit.    Electronically signed by:  CRenaldo Reel COT  10/18/21 8:32 AM  This document serves as a record of services personally performed by BGardiner Sleeper MD, PhD. It was created on their behalf by ASan Jetty BOwens Shark OA an ophthalmic technician. The creation of this record is the provider's dictation and/or activities during the visit.    Electronically signed by: ASan Jetty BOwens Shark ONew [place]07.18.2023 8:32 AM  BGardiner Sleeper M.D., Ph.D. Diseases & Surgery of the Retina and Vitreous [facility] I have reviewed the above documentation for accuracy and completeness, and I agree with the above. BGardiner Sleeper M.D., Ph.D. 10/31/21 8:32 AM   Abbreviations: M myopia (nearsighted); A astigmatism; H hyperopia (farsighted); P presbyopia; Mrx spectacle prescription;  CTL contact lenses; OD right eye; OS left eye; OU both eyes  XT exotropia; ET esotropia; PEK  punctate epithelial keratitis; PEE punctate epithelial erosions; DES dry eye syndrome; MGD meibomian gland dysfunction; ATs artificial tears; PFAT's preservative free artificial tears; NAnthonynuclear sclerotic cataract; PSC posterior subcapsular cataract; ERM epi-retinal membrane; PVD posterior vitreous detachment; RD retinal detachment; DM diabetes mellitus; DR diabetic retinopathy; NPDR non-proliferative diabetic retinopathy; PDR proliferative diabetic retinopathy; CSME clinically significant macular edema; DME diabetic macular edema; dbh dot blot hemorrhages; CWS cotton wool spot; POAG primary open angle glaucoma; C/D cup-to-disc ratio; HVF humphrey visual field; GVF goldmann visual field; OCT optical coherence tomography; IOP intraocular pressure; BRVO Branch retinal vein occlusion; CRVO central retinal vein occlusion; CRAO central retinal artery occlusion; BRAO branch retinal artery occlusion; RT retinal tear; SB scleral buckle; PPV pars plana vitrectomy; VH Vitreous hemorrhage; PRP panretinal laser photocoagulation; IVK intravitreal kenalog; VMT vitreomacular traction; MH Macular hole;  NVD neovascularization of the disc; NVE neovascularization elsewhere; AREDS age related eye disease study; ARMD age related macular degeneration; POAG primary open angle glaucoma; EBMD epithelial/anterior basement membrane dystrophy; ACIOL anterior chamber intraocular lens; IOL intraocular lens; PCIOL posterior chamber intraocular lens; Phaco/IOL phacoemulsification with intraocular lens placement; PReynolds Heightsphotorefractive keratectomy; LASIK laser assisted in situ keratomileusis; HTN hypertension; DM diabetes mellitus;  COPD chronic obstructive pulmonary disease

## 2021-10-26 DIAGNOSIS — M25561 Pain in right knee: Secondary | ICD-10-CM | POA: Diagnosis not present

## 2021-10-31 ENCOUNTER — Encounter (INDEPENDENT_AMBULATORY_CARE_PROVIDER_SITE_OTHER): Payer: Self-pay | Admitting: Ophthalmology

## 2021-10-31 ENCOUNTER — Ambulatory Visit (INDEPENDENT_AMBULATORY_CARE_PROVIDER_SITE_OTHER): Payer: BC Managed Care – PPO | Admitting: Ophthalmology

## 2021-10-31 DIAGNOSIS — H353112 Nonexudative age-related macular degeneration, right eye, intermediate dry stage: Secondary | ICD-10-CM | POA: Diagnosis not present

## 2021-10-31 DIAGNOSIS — I10 Essential (primary) hypertension: Secondary | ICD-10-CM

## 2021-10-31 DIAGNOSIS — H35033 Hypertensive retinopathy, bilateral: Secondary | ICD-10-CM

## 2021-10-31 DIAGNOSIS — H353221 Exudative age-related macular degeneration, left eye, with active choroidal neovascularization: Secondary | ICD-10-CM | POA: Diagnosis not present

## 2021-10-31 DIAGNOSIS — H3552 Pigmentary retinal dystrophy: Secondary | ICD-10-CM

## 2021-10-31 DIAGNOSIS — H25813 Combined forms of age-related cataract, bilateral: Secondary | ICD-10-CM

## 2021-10-31 MED ORDER — FARICIMAB-SVOA 6 MG/0.05ML IZ SOLN
6.0000 mg | INTRAVITREAL | Status: AC | PRN
  Administered 2021-10-31: 6 mg via INTRAVITREAL

## 2021-11-17 DIAGNOSIS — M1711 Unilateral primary osteoarthritis, right knee: Secondary | ICD-10-CM | POA: Diagnosis not present

## 2021-11-21 NOTE — Progress Notes (Signed)
[facility] Note  11/28/2021    CHIEF COMPLAINT Patient presents for Retina Follow Up  HISTORY OF PRESENT ILLNESS: Eric Key is a 66 y.o. male who presents to the clinic today for:   HPI     Retina Follow Up   Patient presents with  Wet AMD (IVV OS 07.18.23).  In left eye.  This started months ago.  Severity is moderate.  Duration of 4 weeks.  Since onset it is stable.  I, the attending physician,  performed the HPI with the patient and updated documentation appropriately.        Comments   Patient denies noticing any vision changes at this time.       Last edited by [name], MD on 11/28/2021  8:42 AM.    Pt states no problems after switch to Vabysmo  Referring physician: [name], [name], MD [place],  [place] [id number]  HISTORICAL INFORMATION:   Selected notes from the MEDICAL RECORD NUMBER Referred by Dr. [name] for concern of mac heme OS.   CURRENT MEDICATIONS: No current outpatient medications on file. (Ophthalmic Drugs)   No current facility-administered medications for this visit. (Ophthalmic Drugs)   Current Outpatient Medications (Other)  Medication Sig   rosuvastatin (CRESTOR) 5 MG tablet Take 1 tablet by mouth once daily   No current facility-administered medications for this visit. (Other)   REVIEW OF SYSTEMS: ROS   Positive for: Eyes Negative for: Constitutional, Gastrointestinal, Neurological, Skin, Genitourinary, Musculoskeletal, HENT, Endocrine, Cardiovascular, Respiratory, Psychiatric, Allergic/Imm, Heme/Lymph Last edited by Annie Paras, COT on 11/28/2021  7:38 AM.     ALLERGIES Allergies  Allergen Reactions   Penicillins Hives   PAST MEDICAL HISTORY Past Medical History:  Diagnosis Date   Cataract    Mixed form OU   History of adenomatous polyps of colon 03/16/2020   Hypertension    past hx    Hypertensive retinopathy    OU   Macular degeneration    Dry OD; Wet OS    Past Surgical History:  Procedure Laterality Date   ACHILLES TENDON REPAIR  2010   CHOLECYSTECTOMY     COLONOSCOPY     GALLBLADDER SURGERY     FAMILY HISTORY Family History  Problem Relation Age of Onset   Diabetes Brother    Heart disease Brother    Diabetes Maternal Grandfather    Colon polyps Neg Hx    Colon cancer Neg Hx    Esophageal cancer Neg Hx    Rectal cancer Neg Hx    Stomach cancer Neg Hx    SOCIAL HISTORY Social History   Tobacco Use   Smoking status: Some Days    Types: Cigarettes, Cigars    Last attempt to quit: 02/05/2020    Years since quitting: 1.8   Smokeless tobacco: Never  Vaping Use   Vaping Use: Never used  Substance Use Topics   Alcohol use: Yes    Comment: occ   Drug use: Never       OPHTHALMIC EXAM: Base Eye Exam     Visual Acuity (Snellen - Linear)       Right Left   Dist cc 20/20 20/25    Correction: Glasses         Tonometry (Tonopen, 7:41 AM)       Right Left   Pressure 10 9         Pupils       Dark Light  Shape React APD   Right 3 2 Round Brisk None   Left 3 2 Round Brisk None         Visual Fields       Left Right    Full Full         Extraocular Movement       Right Left    Full, Ortho Full, Ortho         Neuro/Psych     Oriented x3: Yes   Mood/Affect: Normal         Dilation     Both eyes: 2.5% Phenylephrine, 1.0% Mydriacyl @ 7:39 AM           Slit Lamp and Fundus Exam     Slit Lamp Exam       Right Left   Lids/Lashes Dermatochalasis - upper lid Dermatochalasis - upper lid   Conjunctiva/Sclera White and quiet White and quiet   Cornea 1+ PEE, mild Arcus, mild Debris in tear film mild Arcus, mild Debris in tear film, 2-3+ Punctate epithelial erosions centrally, decreased TBUT   Anterior Chamber deep, clear, narrow angles deep, clear, narrow angles   Iris Round and moderately dilated, mild anterior bowing Round and moderately dilated, mild anterior bowing   Lens 2-3+  Nuclear sclerosis, 2-3+ Cortical cataract 2-3+ Nuclear sclerosis, 2-3+ Cortical cataract   Anterior Vitreous Vitreous syneresis Vitreous syneresis         Fundus Exam       Right Left   Disc Pink and Sharp, focal PPP ST, Compact Pink and Sharp, Compact   C/D Ratio 0.2 0.3   Macula Flat, Drusen, RPE mottling and clumping, early Atrophy, punctate IRH Abnormal foveal reflex, +CNV with heme improved, +central edema / cystic changes -- persistent, Drusen, RPE mottling and clumping   Vessels attenuated, mild tortuosity, perivascular pigment clumping peripherally attenuated, mild tortuosity, perivascular pigment clumping peripherally   Periphery Attached, peripheral pigmented CR scarring and perivascular bone spicules 360 Attached, peripheral pigmented CR scarring and perivascular bone spicules 360           Refraction     Wearing Rx       Sphere Cylinder Axis Add   Right [place] +1.25 177 +2.25   Left [place] +0.75 170 +2.25           IMAGING AND PROCEDURES  Imaging and Procedures for _0 @  OCT, Retina - OU - Both Eyes       Right Eye Quality was borderline. Central Foveal Thickness: 293. Progression has been stable. Findings include normal foveal contour, no IRF, no SRF, retinal drusen , pigment epithelial detachment, outer retinal atrophy (Persistent vitreous opacities; stable low lying PEDs and ORA).   Left Eye Quality was borderline. Central Foveal Thickness: 309. Progression has been stable. Findings include abnormal foveal contour, retinal drusen , subretinal hyper-reflective material, intraretinal fluid, pigment epithelial detachment, outer retinal atrophy (Persistent cystic changes and SRHM temporal fovea and macula).   Notes *Images captured and stored on drive  Diagnosis / Impression:  OD: non-exu ARMD w/ stable low lying PEDs and ORA OS: exu ARMD -- Persistent cystic changes and SRHM temporal fovea and macula  Clinical management:  See below  Abbreviations:  NFP - Normal foveal profile. CME - cystoid macular edema. PED - pigment epithelial detachment. IRF - intraretinal fluid. SRF - subretinal fluid. EZ - ellipsoid zone. ERM - epiretinal membrane. ORA - outer retinal atrophy. ORT - outer retinal tubulation. SRHM - subretinal hyper-reflective material  Intravitreal Injection, Pharmacologic Agent - OS - Left Eye       Time Out 11/28/2021. 8:02 AM. Confirmed correct patient, procedure, site, and patient consented.   Anesthesia Topical anesthesia was used. Anesthetic medications included Lidocaine 2%, Proparacaine 0.5%.   Procedure Preparation included 5% betadine to ocular surface, eyelid speculum. A (32g) needle was used.   Injection: 6 mg faricimab-svoa 6 MG/0.05ML   Route: Intravitreal, Site: Left Eye   NDC: [id number], Lot: [id number], Expiration date: 05/17/2023, Waste: 0 mL   Post-op Post injection exam found visual acuity of at least counting fingers. The patient tolerated the procedure well. There were no complications. The patient received written and verbal post procedure care education. Post injection medications were not given.            ASSESSMENT/PLAN:   ICD-10-CM   1. Exudative age-related macular degeneration of left eye with active choroidal neovascularization (HCC)  H35.3221 OCT, Retina - OU - Both Eyes    Intravitreal Injection, Pharmacologic Agent - OS - Left Eye    faricimab-svoa (VABYSMO) 51m/0.05mL intravitreal injection    2. 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 Essential hypertension  I10     5. Hypertensive retinopathy of both eyes  H35.033     6. Combined forms of age-related cataract of both eyes  H25.813      1. 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, OS  - s/p IVA OS #1 (03.04.21), #2 (03.04.21), #3 (04.30.21) -- IVA resistance  - s/p IVE OS #1 (06.04.21), #2 (07.08.21), #3 (08.09.21), #4  (09.13.21), #5 (10.18.21), #6 (11.24.21), #7 (12.31.21), #8 (02.11.22), #9 (03.25.22), #10 (05.02.22), #11 (06.06.22), #12 (07.13.22), #13 (08.17.22), #14 (09.21.22), #15 (10.26.22), #16 (11.29.22), #17 (01.03.23), #18 (02.07.23), #19 (03.16.23), #20 (04.20.23), #21 (05.19.23), #22 (06.20.23) -- IVE resistance  - s/p IVV OS #1 (07/18.23)  - pt reports stable vision OS  - exam shows central CNV with +subretinal heme -- improved  - BCVA 20/25 OS (improved)  - OCT shows Persistent cystic changes and IRHM temporal fovea and macula  - switched to Vabysmo due to [employer] insurance authorization  - recommend IVV OS #2 today, 08.15.23, w/ f/u in 4 wks  - pt wishes to proceed  - RBA of procedure discussed, questions answered  - informed consent obtained and signed  - see procedure note  - IVA consent form signed and scanned, 03.04.21  - IVE consent form re-signed and scanned, 04.20.23  - IVV consent form signed and scanned, 07.18.23  - f/u in 4 wks -- DFE/OCT, possible injection  2. Age related macular degeneration, non-exudative, OD  - The incidence, anatomy, and pathology of dry AMD, risk of progression, and the AREDS and AREDS 2 study including smoking risks discussed with patient.   - recommend Amsler grid monitoring  3. Pigmentary retinopathy OU  - peripheral pigmented CR scarring and perivascular pigment clumping / bone spicules OU  - VA remains fairly good (20/20 OD, 20/40 OS with exudative ARMD OS)  - denies night blindness  - denies family history of vision problems  - discussed findings  - broad differential for pigmentary retinopathy:  Congenital (RP), infectious (TB, lyme dx, syphilis, toxo, bartonella), autoimmune and toxic etiologies  - labs checked (4.2.21):  All essential WNL   Rheumatoid factor   CBC   Comprehensive metabolic panel     RPR   ANA   ESR,  CRP   ACE   ANCA   Toxoplasma antibodies IgG / IgM   Quantiferon - TB Gold Plus   HLA, A, B, C  (IR)   Fluorescent treponemal ab (fla) - IgG - bld   VDRL Serum   Bartonella antibody, 06.04.21  - [facility] -- drawn on 6.4.21                         Results Positive for AGBL5, ALMS1, OPA1 -- unknown significance              - f/u in 4-5 weeks  4,5. Hypertensive retinopathy OU  - discussed importance of tight BP control  - monitor  6. Mixed form age related cataracts OU  - The symptoms of cataract, surgical options, and treatments and risks were discussed with patient.  - discussed diagnosis and progression  - not yet visually significant  - monitor  Ophthalmic Meds Ordered this visit:  Meds ordered this encounter  Medications   faricimab-svoa (VABYSMO) 66m/0.05mL intravitreal injection     Return in about 4 weeks (around 12/26/2021) for f/u exu ARMD OS, DFE, OCT.  There are no Patient Instructions on file for this visit.  This document serves as a record of services personally performed by BGardiner Sleeper MD, PhD. It was created on their behalf by CRenaldo Reel [place]ugar [place] ophthalmic technician. The creation of this record is the provider's dictation and/or activities during the visit.    Electronically signed by:  CRenaldo Reel COT  11/21/21 8:44 AM   This document serves as a record of services personally performed by BGardiner Sleeper MD, PhD. It was created on their behalf by ASan Jetty BOwens Shark OA an ophthalmic technician. The creation of this record is the provider's dictation and/or activities during the visit.    Electronically signed by: ASan Jetty BOwens Shark O[place]08.15.2023 8:44 AM  BGardiner Sleeper M.D., Ph.D. Diseases & Surgery of the Retina and Vitreous [facility] I have reviewed the above documentation for accuracy and completeness, and I agree with the above. BGardiner Sleeper M.D., Ph.D. 11/28/21 8:45 AM   Abbreviations: M myopia (nearsighted); A astigmatism; H hyperopia (farsighted); P presbyopia; Mrx spectacle  prescription;  CTL contact lenses; OD right eye; OS left eye; OU both eyes  XT exotropia; ET esotropia; PEK punctate epithelial keratitis; PEE punctate epithelial erosions; DES dry eye syndrome; MGD meibomian gland dysfunction; ATs artificial tears; PFAT's preservative free artificial tears; [place]nuclear sclerotic cataract; PSC posterior subcapsular cataract; ERM epi-retinal membrane; PVD posterior vitreous detachment; RD retinal detachment; DM diabetes mellitus; DR diabetic retinopathy; NPDR non-proliferative diabetic retinopathy; PDR proliferative diabetic retinopathy; CSME clinically significant macular edema; DME diabetic macular edema; dbh dot blot hemorrhages; CWS cotton wool spot; POAG primary open angle glaucoma; C/D cup-to-disc ratio; HVF humphrey visual field; GVF goldmann visual field; OCT optical coherence tomography; IOP intraocular pressure; BRVO Branch retinal vein occlusion; CRVO central retinal vein occlusion; CRAO central retinal artery occlusion; BRAO branch retinal artery occlusion; RT retinal tear; SB scleral buckle; PPV pars plana vitrectomy; VH Vitreous hemorrhage; PRP panretinal laser photocoagulation; IVK intravitreal kenalog; VMT vitreomacular traction; MH Macular hole;  NVD neovascularization of the disc; NVE neovascularization elsewhere; AREDS age related eye disease study; ARMD age related macular degeneration; POAG primary open angle glaucoma; EBMD epithelial/anterior basement membrane dystrophy; ACIOL anterior chamber intraocular lens; IOL intraocular lens; PCIOL posterior chamber intraocular lens; Phaco/IOL phacoemulsification with intraocular lens placement;  [place] photorefractive keratectomy; LASIK laser assisted in situ keratomileusis; HTN hypertension; DM diabetes mellitus; COPD chronic obstructive pulmonary disease

## 2021-11-28 ENCOUNTER — Encounter (INDEPENDENT_AMBULATORY_CARE_PROVIDER_SITE_OTHER): Payer: Self-pay | Admitting: Ophthalmology

## 2021-11-28 ENCOUNTER — Ambulatory Visit (INDEPENDENT_AMBULATORY_CARE_PROVIDER_SITE_OTHER): Payer: BC Managed Care – PPO | Admitting: Ophthalmology

## 2021-11-28 DIAGNOSIS — H353221 Exudative age-related macular degeneration, left eye, with active choroidal neovascularization: Secondary | ICD-10-CM | POA: Diagnosis not present

## 2021-11-28 DIAGNOSIS — H25813 Combined forms of age-related cataract, bilateral: Secondary | ICD-10-CM

## 2021-11-28 DIAGNOSIS — H3552 Pigmentary retinal dystrophy: Secondary | ICD-10-CM

## 2021-11-28 DIAGNOSIS — H353112 Nonexudative age-related macular degeneration, right eye, intermediate dry stage: Secondary | ICD-10-CM

## 2021-11-28 DIAGNOSIS — H35033 Hypertensive retinopathy, bilateral: Secondary | ICD-10-CM

## 2021-11-28 DIAGNOSIS — I10 Essential (primary) hypertension: Secondary | ICD-10-CM | POA: Diagnosis not present

## 2021-11-28 MED ORDER — FARICIMAB-SVOA 6 MG/0.05ML IZ SOLN
6.0000 mg | INTRAVITREAL | Status: AC | PRN
  Administered 2021-11-28: 6 mg via INTRAVITREAL

## 2021-12-04 ENCOUNTER — Ambulatory Visit: Payer: BC Managed Care – PPO | Admitting: Family Medicine

## 2021-12-04 ENCOUNTER — Ambulatory Visit (INDEPENDENT_AMBULATORY_CARE_PROVIDER_SITE_OTHER): Payer: BC Managed Care – PPO | Admitting: Family Medicine

## 2021-12-04 ENCOUNTER — Encounter: Payer: Self-pay | Admitting: Family Medicine

## 2021-12-04 VITALS — BP 146/88 | HR 82 | Temp 98.0°F | Ht 73.0 in | Wt 256.0 lb

## 2021-12-04 DIAGNOSIS — E785 Hyperlipidemia, unspecified: Secondary | ICD-10-CM | POA: Insufficient documentation

## 2021-12-04 DIAGNOSIS — E7800 Pure hypercholesterolemia, unspecified: Secondary | ICD-10-CM

## 2021-12-04 DIAGNOSIS — Z125 Encounter for screening for malignant neoplasm of prostate: Secondary | ICD-10-CM | POA: Diagnosis not present

## 2021-12-04 DIAGNOSIS — M1711 Unilateral primary osteoarthritis, right knee: Secondary | ICD-10-CM | POA: Diagnosis not present

## 2021-12-04 DIAGNOSIS — Z0001 Encounter for general adult medical examination with abnormal findings: Secondary | ICD-10-CM | POA: Diagnosis not present

## 2021-12-04 DIAGNOSIS — L858 Other specified epidermal thickening: Secondary | ICD-10-CM

## 2021-12-04 DIAGNOSIS — Z0000 Encounter for general adult medical examination without abnormal findings: Secondary | ICD-10-CM

## 2021-12-04 DIAGNOSIS — Z716 Tobacco abuse counseling: Secondary | ICD-10-CM

## 2021-12-04 MED ORDER — BUPROPION HCL ER (SMOKING DET) 150 MG PO TB12: 150.0000 mg | ORAL_TABLET | Freq: Two times a day (BID) | ORAL | 3 refills | Status: DC

## 2021-12-04 MED ORDER — ROSUVASTATIN CALCIUM 5 MG PO TABS: 5.0000 mg | ORAL_TABLET | Freq: Every day | ORAL | 3 refills | Status: DC

## 2021-12-04 NOTE — Progress Notes (Signed)
BP (!) 146/88   Pulse 82   Temp 98 F (36.7 C)   Ht _0  (1.854 m)   Wt 256 lb (116.1 kg)   SpO2 98%   BMI 33.78 kg/m    Subjective:   [name], male    DOB: 08-Apr-1956, 66 y.o.   MRN: [id number]  HPI: CAROLL CUNNINGTON is a 66 y.o. male presenting on 12/04/2021 for Medical Management of Chronic Issues (CPE), Knee Pain (right), and Skin Tag (back)   HPI Well adult exam Patient denies any chest pain, shortness of breath, headaches or vision issues, abdominal complaints, diarrhea, nausea, vomiting.  Patient does have right knee pain and recently had arthroscopy in March 6 months ago.  He says it has not done the best for him but he is still working with orthopedic for this.  Patient also says that he is smoking 4-5 cigarillos a day and is looking to quit.  He says he used Diovan previously and wants to try that again.  Patient has a spot on his back has been painful and irritated and has been there for at least a year or 2.  Hyperlipidemia Patient is coming in for recheck of his hyperlipidemia. The patient is currently taking Crestor. They deny any issues with myalgias or history of liver damage from it. They deny any focal numbness or weakness or chest pain.   Relevant past medical, surgical, family and social history reviewed and updated as indicated. Interim medical history since our last visit reviewed. Allergies and medications reviewed and updated.  Review of Systems  Constitutional:  Negative for chills and fever.  HENT:  Negative for ear pain and tinnitus.   Eyes:  Negative for pain and discharge.  Respiratory:  Negative for cough, shortness of breath and wheezing.   Cardiovascular:  Negative for chest pain, palpitations and leg swelling.  Gastrointestinal:  Negative for abdominal pain, blood in stool, constipation and diarrhea.  Genitourinary:  Negative for dysuria and hematuria.  Musculoskeletal:  Positive for arthralgias and joint swelling. Negative  for back pain, gait problem and myalgias.  Skin:  Negative for rash.  Neurological:  Negative for dizziness, weakness and headaches.  Psychiatric/Behavioral:  Negative for suicidal ideas.   All other systems reviewed and are negative.   Per HPI unless specifically indicated above   Allergies as of 12/04/2021       Reactions   Penicillins Hives        Medication List        Accurate as of December 04, 2021  8:39 AM. If you have any questions, ask your nurse or doctor.          buPROPion 150 MG 12 hr tablet Commonly known as: Zyban Take 1 tablet (150 mg total) by mouth 2 (two) times daily. Started by: [name] Emery Dupuy, MD   rosuvastatin 5 MG tablet Commonly known as: CRESTOR Take 1 tablet (5 mg total) by mouth daily.         Objective:   BP (!) 146/88   Pulse 82   Temp 98 F (36.7 C)   Ht _1  (1.854 m)   Wt 256 lb (116.1 kg)   SpO2 98%   BMI 33.78 kg/m   Wt Readings from Last 3 Encounters:  12/04/21 256 lb (116.1 kg)  05/03/21 248 lb (112.5 kg)  04/12/21 249 lb (112.9 kg)    Physical Exam Vitals and nursing note reviewed.  Constitutional:  General: He is not in acute distress.    Appearance: He is well-developed. He is not diaphoretic.  HENT:     Right Ear: External ear normal.     Left Ear: External ear normal.     Nose: Nose normal.     Mouth/Throat:     Pharynx: No oropharyngeal exudate.  Eyes:     General: No scleral icterus.       Right eye: No discharge.     Conjunctiva/sclera: Conjunctivae normal.     Pupils: Pupils are equal, round, and reactive to light.  Neck:     Thyroid: No thyromegaly.  Cardiovascular:     Rate and Rhythm: Normal rate and regular rhythm.     Heart sounds: Normal heart sounds. No murmur heard. Pulmonary:     Effort: Pulmonary effort is normal. No respiratory distress.     Breath sounds: Normal breath sounds. No wheezing.  Abdominal:     General: Bowel sounds are normal. There is no distension.      Palpations: Abdomen is soft.     Tenderness: There is no abdominal tenderness. There is no guarding or rebound.  Musculoskeletal:        General: Normal range of motion.     Cervical back: Neck supple.     Right knee: Effusion and crepitus present. No erythema or ecchymosis. Tenderness present over the medial joint line. No LCL laxity, MCL laxity, ACL laxity or PCL laxity.  Lymphadenopathy:     Cervical: No cervical adenopathy.  Skin:    General: Skin is warm and dry.     Findings: Lesion (Cutaneous horn on right mid back) present. No rash.  Neurological:     Mental Status: He is alert and oriented to person, place, and time.     Coordination: Coordination normal.  Psychiatric:        Behavior: Behavior normal.       Assessment & Plan:   Problem List Items Addressed This Visit       Other   Pure hypercholesterolemia   Relevant Medications   rosuvastatin (CRESTOR) 5 MG tablet   Other Relevant Orders   Lipid panel   Other Visit Diagnoses     Well adult exam    -  Primary   Relevant Orders   CBC with Differential/Platelet   CMP14+EGFR   Lipid panel   PSA, total and free   Prostate cancer screening       Relevant Orders   PSA, total and free   Primary osteoarthritis of right knee       Cutaneous horn       Encounter for smoking cessation counseling       Relevant Medications   buPROPion (ZYBAN) 150 MG 12 hr tablet       We will schedule back for skin removal, continue to follow with orthopedic for knee but recommended he try topical Voltaren gel and ice for couple weeks and see if we can reduce the inflammation.  We will give Wellbutrin for smoking cessation Follow up plan: Return if symptoms worsen or fail to improve, for Schedule appointment for skin lesion removal.  Counseling provided for all of the vaccine components Orders Placed This Encounter  Procedures   CBC with Differential/Platelet   CMP14+EGFR   Lipid panel   PSA, total and free    Caryl Pina, MD [facility] 12/04/2021, 8:39 AM

## 2021-12-05 LAB — CMP14+EGFR
ALT: 9 IU/L (ref 0–44)
AST: 10 IU/L (ref 0–40)
Albumin/Globulin Ratio: 1.4 (ref 1.2–2.2)
Albumin: 4.3 g/dL (ref 3.9–4.9)
Alkaline Phosphatase: 45 IU/L (ref 44–121)
BUN/Creatinine Ratio: 14 (ref 10–24)
BUN: 16 mg/dL (ref 8–27)
Bilirubin Total: 0.5 mg/dL (ref 0.0–1.2)
CO2: 23 mmol/L (ref 20–29)
Calcium: 9.1 mg/dL (ref 8.6–10.2)
Chloride: 106 mmol/L (ref 96–106)
Creatinine, Ser: 1.12 mg/dL (ref 0.76–1.27)
Globulin, Total: 3.1 g/dL (ref 1.5–4.5)
Glucose: 127 mg/dL — ABNORMAL HIGH (ref 70–99)
Potassium: 4.5 mmol/L (ref 3.5–5.2)
Sodium: 140 mmol/L (ref 134–144)
Total Protein: 7.4 g/dL (ref 6.0–8.5)
eGFR: 73 mL/min/{1.73_m2} (ref >59)

## 2021-12-05 LAB — CBC WITH DIFFERENTIAL/PLATELET
Basophils Absolute: 0.1 10*3/uL (ref 0.0–0.2)
Basos: 1 %
EOS (ABSOLUTE): 0.2 10*3/uL (ref 0.0–0.4)
Eos: 3 %
Hematocrit: 46 % (ref 37.5–51.0)
Hemoglobin: 15 g/dL (ref 13.0–17.7)
Immature Grans (Abs): 0 10*3/uL (ref 0.0–0.1)
Immature Granulocytes: 0 %
Lymphocytes Absolute: 2.4 10*3/uL (ref 0.7–3.1)
Lymphs: 31 %
MCH: 30.6 pg (ref 26.6–33.0)
MCHC: 32.6 g/dL (ref 31.5–35.7)
MCV: 94 fL (ref 79–97)
Monocytes Absolute: 0.6 10*3/uL (ref 0.1–0.9)
Monocytes: 8 %
Neutrophils Absolute: 4.4 10*3/uL (ref 1.4–7.0)
Neutrophils: 57 %
Platelets: 121 10*3/uL — ABNORMAL LOW (ref 150–450)
RBC: 4.9 x10E6/uL (ref 4.14–5.80)
RDW: 11.8 % (ref 11.6–15.4)
WBC: 7.7 10*3/uL (ref 3.4–10.8)

## 2021-12-05 LAB — PSA, TOTAL AND FREE
PSA, Free Pct: 20 %
PSA, Free: 0.32 ng/mL
Prostate Specific Ag, Serum: 1.6 ng/mL (ref 0.0–4.0)

## 2021-12-05 LAB — LIPID PANEL
Chol/HDL Ratio: 3 ratio (ref 0.0–5.0)
Cholesterol, Total: 130 mg/dL (ref 100–199)
HDL: 44 mg/dL (ref >39)
LDL Chol Calc (NIH): 70 mg/dL (ref 0–99)
Triglycerides: 81 mg/dL (ref 0–149)
VLDL Cholesterol Cal: 16 mg/dL (ref 5–40)

## 2021-12-14 NOTE — Progress Notes (Signed)
[facility] Note  12/26/2021    CHIEF COMPLAINT Patient presents for Retina Follow Up  HISTORY OF PRESENT ILLNESS: Eric Key is a 66 y.o. male who presents to the clinic today for:   HPI     Retina Follow Up   Patient presents with  Wet AMD.  In left eye.  Severity is moderate.  Duration of 4 weeks.  Since onset it is stable.  I, the attending physician,  performed the HPI with the patient and updated documentation appropriately.        Comments   Pt here for 4 wk ret f/u exu ARMD OS. Pt states VA is unchanged, no issues noted.       Last edited by Eric Caffey, MD on 12/26/2021  8:32 AM.    Pt states no change in vision  Referring physician: [name], Eric Kaufmann, MD [place],  [place] [address]  HISTORICAL INFORMATION:   Selected notes from the MEDICAL RECORD NUMBER Referred by Dr. Carmel Key for concern of mac heme OS.   CURRENT MEDICATIONS: No current outpatient medications on file. (Ophthalmic Drugs)   No current facility-administered medications for this visit. (Ophthalmic Drugs)   Current Outpatient Medications (Other)  Medication Sig   buPROPion (ZYBAN) 150 MG 12 hr tablet Take 1 tablet (150 mg total) by mouth 2 (two) times daily.   meloxicam (MOBIC) 15 MG tablet Take 15 mg by mouth daily.   rosuvastatin (CRESTOR) 5 MG tablet Take 1 tablet (5 mg total) by mouth daily.   No current facility-administered medications for this visit. (Other)   REVIEW OF SYSTEMS: ROS   Positive for: Eyes Negative for: Constitutional, Gastrointestinal, Neurological, Skin, Genitourinary, Musculoskeletal, HENT, Endocrine, Cardiovascular, Respiratory, Psychiatric, Allergic/Imm, Heme/Lymph Last edited by Eric Key, COT on 12/26/2021  7:50 AM.      ALLERGIES Allergies  Allergen Reactions   Penicillins Hives   PAST MEDICAL HISTORY Past Medical History:  Diagnosis Date   Cataract    Mixed form OU   History of  adenomatous polyps of colon 03/16/2020   Hypertension    past hx    Hypertensive retinopathy    OU   Macular degeneration    Dry OD; Wet OS   Past Surgical History:  Procedure Laterality Date   ACHILLES TENDON REPAIR  2010   CHOLECYSTECTOMY     COLONOSCOPY     GALLBLADDER SURGERY     FAMILY HISTORY Family History  Problem Relation Age of Onset   Diabetes Brother    Heart disease Brother    Diabetes Maternal Grandfather    Colon polyps Neg Hx    Colon cancer Neg Hx    Esophageal cancer Neg Hx    Rectal cancer Neg Hx    Stomach cancer Neg Hx    SOCIAL HISTORY Social History   Tobacco Use   Smoking status: Some Days    Types: Cigarettes, Cigars    Last attempt to quit: 02/05/2020    Years since quitting: 1.8   Smokeless tobacco: Never  Vaping Use   Vaping Use: Never used  Substance Use Topics   Alcohol use: Yes    Comment: occ   Drug use: Never       OPHTHALMIC EXAM: Base Eye Exam     Visual Acuity (Snellen - Linear)       Right Left   Dist cc 20/20 20/30 +1   Dist ph cc  NI  Correction: Glasses         Tonometry (Tonopen, 7:57 AM)       Right Left   Pressure 9 8         Pupils       Dark Light Shape React APD   Right 3 2 Round Brisk None   Left 3 2 Round Brisk None         Visual Fields (Counting fingers)       Left Right    Full Full         Extraocular Movement       Right Left    Full, Ortho Full, Ortho         Neuro/Psych     Oriented x3: Yes   Mood/Affect: Normal         Dilation     Both eyes: 1.0% Mydriacyl, 2.5% Phenylephrine @ 7:58 AM           Slit Lamp and Fundus Exam     Slit Lamp Exam       Right Left   Lids/Lashes Dermatochalasis - upper lid Dermatochalasis - upper lid   Conjunctiva/Sclera White and quiet White and quiet   Cornea 1-2+ PEE, mild Arcus, trace tear film debris mild Arcus, 2-3+ Punctate epithelial erosions centrally, mild tear film debris   Anterior Chamber deep, clear,  narrow angles deep, clear, narrow angles   Iris Round and moderately dilated, mild anterior bowing Round and moderately dilated, mild anterior bowing   Lens 2-3+ Nuclear sclerosis, 2-3+ Cortical cataract 2-3+ Nuclear sclerosis, 2-3+ Cortical cataract   Anterior Vitreous Vitreous syneresis Vitreous syneresis         Fundus Exam       Right Left   Disc Pink and Sharp, focal PPP ST, Compact Pink and Sharp, Compact   C/D Ratio 0.2 0.3   Macula Flat, Drusen, RPE mottling and clumping, early Atrophy, punctate IRH Abnormal foveal reflex, +CNV with heme improved, +central edema / cystic changes -- slightly improved, Drusen, RPE mottling and clumping, early subretinal fibrosis   Vessels attenuated, mild tortuosity, perivascular pigment clumping peripherally attenuated, mild tortuosity, perivascular pigment clumping peripherally   Periphery Attached, peripheral pigmented CR scarring and perivascular bone spicules 360 Attached, peripheral pigmented CR scarring and perivascular bone spicules 360           Refraction     Wearing Rx       Sphere Cylinder Axis Add   Right [place] +1.25 177 +2.25   Left [place] +0.75 170 +2.25         Manifest Refraction       Sphere Cylinder Axis Dist VA   Right       Left +0.50 +0.75 165 20/30           IMAGING AND PROCEDURES  Imaging and Procedures for _0 @  OCT, Retina - OU - Both Eyes       Right Eye Quality was borderline. Central Foveal Thickness: 302. Progression has been stable. Findings include normal foveal contour, no IRF, no SRF, retinal drusen , pigment epithelial detachment, outer retinal atrophy (Persistent vitreous opacities; stable low lying PEDs and ORA).   Left Eye Quality was borderline. Central Foveal Thickness: 311. Progression has improved. Findings include abnormal foveal contour, retinal drusen , subretinal hyper-reflective material, intraretinal fluid, pigment epithelial detachment, outer retinal atrophy (Persistent IRF  temporal fovea and macula -- slightly improved).   Notes *Images captured and stored on drive  Diagnosis / Impression:  OD: non-exu ARMD w/ stable low lying PEDs and ORA OS: exu ARMD -- Persistent IRF temporal fovea and macula -- slightly improved  Clinical management:  See below  Abbreviations: NFP - Normal foveal profile. CME - cystoid macular edema. PED - pigment epithelial detachment. IRF - intraretinal fluid. SRF - subretinal fluid. EZ - ellipsoid zone. ERM - epiretinal membrane. ORA - outer retinal atrophy. ORT - outer retinal tubulation. SRHM - subretinal hyper-reflective material      Intravitreal Injection, Pharmacologic Agent - OS - Left Eye       Time Out 12/26/2021. 7:56 AM. Confirmed correct patient, procedure, site, and patient consented.   Anesthesia Topical anesthesia was used. Anesthetic medications included Lidocaine 2%, Proparacaine 0.5%.   Procedure Preparation included 5% betadine to ocular surface, eyelid speculum. A (32g) needle was used.   Injection: 6 mg faricimab-svoa 6 MG/0.05ML   Route: Intravitreal, Site: Left Eye   NDC: 16109-604-54, Lot: [id number], Expiration date: 10/14/2023, Waste: 0 mL   Post-op Post injection exam found visual acuity of at least counting fingers. The patient tolerated the procedure well. There were no complications. The patient received written and verbal post procedure care education. Post injection medications were not given.             ASSESSMENT/PLAN:   ICD-10-CM   1. Exudative age-related macular degeneration of left eye with active choroidal neovascularization (HCC)  H35.3221 OCT, Retina - OU - Both Eyes    Intravitreal Injection, Pharmacologic Agent - OS - Left Eye    faricimab-svoa (VABYSMO) 11m/0.05mL intravitreal injection    2. 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 Essential  hypertension  I10     5. Hypertensive retinopathy of both eyes  H35.033     6. Combined forms of age-related cataract of both eyes  H25.813       1. 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, OS  - s/p IVA OS #1 (03.04.21), #2 (03.04.21), #3 (04.30.21) -- IVA resistance  - s/p IVE OS #1 (06.04.21), #2 (07.08.21), #3 (08.09.21), #4 (09.13.21), #5 (10.18.21), #6 (11.24.21), #7 (12.31.21), #8 (02.11.22), #9 (03.25.22), #10 (05.02.22), #11 (06.06.22), #12 (07.13.22), #13 (08.17.22), #14 (09.21.22), #15 (10.26.22), #16 (11.29.22), #17 (01.03.23), #18 (02.07.23), #19 (03.16.23), #20 (04.20.23), #21 (05.19.23), #22 (06.20.23) -- IVE resistance  - s/p IVV OS #1 (07/18.23), #2 (08.15.23)  - pt reports stable vision OS  - exam shows central CNV with +subretinal heme -- improved  - BCVA 20/30 OS (decreased)  - OCT shows Persistent IRF temporal fovea and macula -- slightly improved  - recommend IVV OS #3 today, 09.12.23, w/ f/u in 4 wks  - pt wishes to proceed  - RBA of procedure discussed, questions answered  - informed consent obtained and signed  - see procedure note  - IVA consent form signed and scanned, 03.04.21  - IVE consent form re-signed and scanned, 04.20.23  - IVV consent form signed and scanned, 07.18.23  - f/u in 4 wks -- DFE/OCT, possible injection  2. Age related macular degeneration, non-exudative, OD  - The incidence, anatomy, and pathology of dry AMD, risk of progression, and the AREDS and AREDS 2 study including smoking risks discussed with patient.   - recommend Amsler grid monitoring  3. Pigmentary retinopathy OU  - peripheral pigmented CR scarring and perivascular pigment clumping / bone spicules OU  - VA remains  fairly good (20/20 OD, 20/40 OS with exudative ARMD OS)  - denies night blindness  - denies family history of vision problems  - discussed findings  - broad differential for pigmentary retinopathy:  Congenital (RP), infectious (TB, lyme dx, syphilis, toxo,  bartonella), autoimmune and toxic etiologies  - labs checked (4.2.21):  All essential WNL   Rheumatoid factor   CBC   Comprehensive metabolic panel     RPR   ANA   ESR, CRP   ACE   ANCA   Toxoplasma antibodies IgG / IgM   Quantiferon - TB Gold Plus   HLA, A, B, C (IR)   Fluorescent treponemal ab (fla) - IgG - bld   VDRL Serum   Bartonella antibody, 06.04.21  - [facility] -- drawn on 6.4.21                         Results Positive for AGBL5, ALMS1, OPA1 -- unknown significance              - f/u in 4-5 weeks  4,5. Hypertensive retinopathy OU  - discussed importance of tight BP control  - monitor  6. Mixed form age related cataracts OU  - The symptoms of cataract, surgical options, and treatments and risks were discussed with patient.  - discussed diagnosis and progression  - not yet visually significant  - monitor  Ophthalmic Meds Ordered this visit:  Meds ordered this encounter  Medications   faricimab-svoa (VABYSMO) 21m/0.05mL intravitreal injection     Return in about 4 weeks (around 01/23/2022) for f/u exu ARMD OS, DFE, OCT.  There are no Patient Instructions on file for this visit.  This document serves as a record of services personally performed by BGardiner Sleeper MD, PhD. It was created on their behalf by CRenaldo Reel CBrentwoodan ophthalmic technician. The creation of this record is the provider's dictation and/or activities during the visit.    Electronically signed by:  CRenaldo Reel COT  12/14/21 12:24 PM   This document serves as a record of services personally performed by BGardiner Sleeper MD, PhD. It was created on their behalf by ASan Jetty BOwens Shark OA an ophthalmic technician. The creation of this record is the provider's dictation and/or activities during the visit.    Electronically signed by: ASan Jetty BMarguerita Merles09.12.2023 12:24 PM  BGardiner Sleeper M.D., Ph.D. Diseases & Surgery of the Retina and Vitreous [facility] I have reviewed the above documentation for accuracy and completeness, and I agree with the above. BGardiner Sleeper M.D., Ph.D. 12/26/21 12:25 PM   Abbreviations: M myopia (nearsighted); A astigmatism; H hyperopia (farsighted); P presbyopia; Mrx spectacle prescription;  CTL contact lenses; OD right eye; OS left eye; OU both eyes  XT exotropia; ET esotropia; PEK punctate epithelial keratitis; PEE punctate epithelial erosions; DES dry eye syndrome; MGD meibomian gland dysfunction; ATs artificial tears; PFAT's preservative free artificial tears; NKerseynuclear sclerotic cataract; PSC posterior subcapsular cataract; ERM epi-retinal membrane; PVD posterior vitreous detachment; RD retinal detachment; DM diabetes mellitus; DR diabetic retinopathy; NPDR non-proliferative diabetic retinopathy; PDR proliferative diabetic retinopathy; CSME clinically significant macular edema; DME diabetic macular edema; dbh dot blot hemorrhages; CWS cotton wool spot; POAG primary open angle glaucoma; C/D cup-to-disc ratio; HVF humphrey visual field; GVF goldmann visual field; OCT optical coherence tomography; IOP intraocular pressure; BRVO Branch retinal vein occlusion; CRVO central retinal vein occlusion; CRAO central retinal artery occlusion;  BRAO branch retinal artery occlusion; RT retinal tear; SB scleral buckle; PPV pars plana vitrectomy; VH Vitreous hemorrhage; PRP panretinal laser photocoagulation; IVK intravitreal kenalog; VMT vitreomacular traction; MH Macular hole;  NVD neovascularization of the disc; NVE neovascularization elsewhere; AREDS age related eye disease study; ARMD age related macular degeneration; POAG primary open angle glaucoma; EBMD epithelial/anterior basement membrane dystrophy; ACIOL anterior chamber intraocular lens; IOL intraocular lens; PCIOL posterior chamber intraocular lens; Phaco/IOL phacoemulsification with intraocular lens placement; [place] photorefractive keratectomy; LASIK laser assisted in  situ keratomileusis; HTN hypertension; DM diabetes mellitus; COPD chronic obstructive pulmonary disease

## 2021-12-20 ENCOUNTER — Encounter: Payer: Self-pay | Admitting: Family Medicine

## 2021-12-20 ENCOUNTER — Other Ambulatory Visit (HOSPITAL_COMMUNITY)
Admission: RE | Admit: 2021-12-20 | Discharge: 2021-12-20 | Disposition: A | Discharge status: Home / Self Care | Payer: BC Managed Care – PPO | Source: Ambulatory Visit | Attending: Family Medicine | Admitting: Family Medicine

## 2021-12-20 ENCOUNTER — Ambulatory Visit (INDEPENDENT_AMBULATORY_CARE_PROVIDER_SITE_OTHER): Payer: BC Managed Care – PPO | Admitting: Family Medicine

## 2021-12-20 VITALS — BP 127/78 | HR 90 | Temp 98.0°F | Wt 253.0 lb

## 2021-12-20 DIAGNOSIS — L858 Other specified epidermal thickening: Secondary | ICD-10-CM

## 2021-12-20 NOTE — Progress Notes (Signed)
   BP 127/78   Pulse 90   Temp 98 F (36.7 C)   Wt 253 lb (114.8 kg)   SpO2 92%   BMI 33.38 kg/m    Subjective:   [name], male    DOB: 1955/12/15, 66 y.o.   MRN: [id number]  HPI: Eric Key is a 66 y.o. male presenting on 12/20/2021 for Skin tag removal (Right back)   HPI Patient coming in for skin lesion excision, cutaneous horn, concerning for squamous cell  Relevant past medical, surgical, family and social history reviewed and updated as indicated. Interim medical history since our last visit reviewed. Allergies and medications reviewed and updated.  Review of Systems  Skin:  Negative for color change.    Per HPI unless specifically indicated above   Allergies as of 12/20/2021       Reactions   Penicillins Hives        Medication List        Accurate as of December 20, 2021  8:53 AM. If you have any questions, ask your nurse or doctor.          buPROPion 150 MG 12 hr tablet Commonly known as: Zyban Take 1 tablet (150 mg total) by mouth 2 (two) times daily.   meloxicam 15 MG tablet Commonly known as: MOBIC Take 15 mg by mouth daily.   rosuvastatin 5 MG tablet Commonly known as: CRESTOR Take 1 tablet (5 mg total) by mouth daily.         Objective:   BP 127/78   Pulse 90   Temp 98 F (36.7 C)   Wt 253 lb (114.8 kg)   SpO2 92%   BMI 33.38 kg/m   Wt Readings from Last 3 Encounters:  12/20/21 253 lb (114.8 kg)  12/04/21 256 lb (116.1 kg)  05/03/21 248 lb (112.5 kg)    Physical Exam Vitals and nursing note reviewed.  Skin:    Findings: Lesion (Cutaneous horn on right lower back, about quarter centimeter elevation above the skin and course centimeter wide) present.     Skin lesion removal: Elliptical incision was made following along skin lines giving margins of 2 mm on either side of the lesion. 2% lidocaine with epinephrine was used for local anesthesia, 2 mL. 3-0 PGA was used to repair the wound.  Placed 3  sutures. Incision was approximated well and topical antibiotic was used and then it was covered by 4 x 4 and tape told in place. Procedure was tolerated well  Assessment & Plan:   Problem List Items Addressed This Visit   None Visit Diagnoses     Cutaneous horn    -  Primary   Relevant Orders   Surgical pathology        Follow up plan: Return if symptoms worsen or fail to improve, for 8 to 10 days suture removal.  Counseling provided for all of the vaccine components No orders of the defined types were placed in this encounter.   Caryl Pina, MD [facility] 12/20/2021, 8:53 AM

## 2021-12-21 LAB — SURGICAL PATHOLOGY

## 2021-12-26 ENCOUNTER — Ambulatory Visit (INDEPENDENT_AMBULATORY_CARE_PROVIDER_SITE_OTHER): Payer: BC Managed Care – PPO | Admitting: Ophthalmology

## 2021-12-26 ENCOUNTER — Encounter (INDEPENDENT_AMBULATORY_CARE_PROVIDER_SITE_OTHER): Payer: Self-pay | Admitting: Ophthalmology

## 2021-12-26 DIAGNOSIS — H353112 Nonexudative age-related macular degeneration, right eye, intermediate dry stage: Secondary | ICD-10-CM

## 2021-12-26 DIAGNOSIS — I10 Essential (primary) hypertension: Secondary | ICD-10-CM

## 2021-12-26 DIAGNOSIS — H35033 Hypertensive retinopathy, bilateral: Secondary | ICD-10-CM

## 2021-12-26 DIAGNOSIS — H3552 Pigmentary retinal dystrophy: Secondary | ICD-10-CM

## 2021-12-26 DIAGNOSIS — H353221 Exudative age-related macular degeneration, left eye, with active choroidal neovascularization: Secondary | ICD-10-CM

## 2021-12-26 DIAGNOSIS — H25813 Combined forms of age-related cataract, bilateral: Secondary | ICD-10-CM

## 2021-12-26 MED ORDER — FARICIMAB-SVOA 6 MG/0.05ML IZ SOLN
6.0000 mg | INTRAVITREAL | Status: AC | PRN
  Administered 2021-12-26: 6 mg via INTRAVITREAL

## 2021-12-28 ENCOUNTER — Encounter: Payer: Self-pay | Admitting: Family Medicine

## 2021-12-28 ENCOUNTER — Ambulatory Visit (INDEPENDENT_AMBULATORY_CARE_PROVIDER_SITE_OTHER): Payer: BC Managed Care – PPO | Admitting: Family Medicine

## 2021-12-28 VITALS — BP 135/85 | HR 103 | Temp 98.0°F | Wt 255.0 lb

## 2021-12-28 DIAGNOSIS — B079 Viral wart, unspecified: Secondary | ICD-10-CM

## 2021-12-28 NOTE — Progress Notes (Signed)
BP 135/85   Pulse (!) 103   Temp 98 F (36.7 C)   Wt 255 lb (115.7 kg)   SpO2 93%   BMI 33.64 kg/m    Subjective:   [name], male    DOB: 01/26/1956, 66 y.o.   MRN: [id number]  HPI: Eric Key is a 66 y.o. male presenting on 12/28/2021 for post op- suture removal   HPI Patient is coming in for suture removal.  The lesion ended up being a verruca vulgaris or simple wart.  He is very happy with that.  He denies any redness or warmth.  Relevant past medical, surgical, family and social history reviewed and updated as indicated. Interim medical history since our last visit reviewed. Allergies and medications reviewed and updated.  Review of Systems  Constitutional:  Negative for chills and fever.  Skin:  Positive for wound. Negative for color change and rash.  All other systems reviewed and are negative.   Per HPI unless specifically indicated above   Allergies as of 12/28/2021       Reactions   Penicillins Hives        Medication List        Accurate as of December 28, 2021  9:25 AM. If you have any questions, ask your nurse or doctor.          buPROPion 150 MG 12 hr tablet Commonly known as: Zyban Take 1 tablet (150 mg total) by mouth 2 (two) times daily.   meloxicam 15 MG tablet Commonly known as: MOBIC Take 15 mg by mouth daily.   rosuvastatin 5 MG tablet Commonly known as: CRESTOR Take 1 tablet (5 mg total) by mouth daily.         Objective:   BP 135/85   Pulse (!) 103   Temp 98 F (36.7 C)   Wt 255 lb (115.7 kg)   SpO2 93%   BMI 33.64 kg/m   Wt Readings from Last 3 Encounters:  12/28/21 255 lb (115.7 kg)  12/20/21 253 lb (114.8 kg)  12/04/21 256 lb (116.1 kg)    Physical Exam Vitals and nursing note reviewed.  Constitutional:      Appearance: Normal appearance.  Skin:    Findings: Wound present.       Neurological:     Mental Status: He is alert.     Results for orders placed or performed in  visit on 12/20/21  Surgical pathology  Result Value Ref Range   SURGICAL PATHOLOGY      SURGICAL PATHOLOGY CASE: [id number] PATIENT: Eric Key Surgical Pathology Report     Clinical History: cutaneous horn (cm)     FINAL MICROSCOPIC DIAGNOSIS:  A. SKIN, RIGHT LOWER BACK, EXCISION: Consistent with verruca vulgaris   GROSS DESCRIPTION:  Received in formalin is a 1.3 x 0.8 cm tan-white skin which is up to 0.5 cm thick, without fat.  The skin surface has a 0.8 x 0.7 x 0.5 cm gray-white firm exophytic nodule. The specimen is inked and entirely submitted as follows: Block 1 = 3 central cross-sections including nodule Block 2 = tips  SW 12/20/2021   Final Diagnosis performed by Tobin Chad, MD.   Electronically signed 12/21/2021 Technical and / or Professional components performed at [facility]. [facility], [place] [address] [place], [place] [address].  Immunohistochemistry Technical component (if applicable) was performed at [facility]. [address], [place], [place], [place] [address].   IMMUNOHISTOCHEMIS TRY DISCLAIMER (if  applicable): Some of these immunohistochemical stains may have been developed and the performance characteristics determine by [facility]. Some may not have been cleared or approved by the U.S. Food and Drug Administration. The FDA has determined that such clearance or approval is not necessary. This test is used for clinical purposes. It should not be regarded as investigational or for research. This laboratory is certified under the [place] (CLIA-88) as qualified to perform high complexity clinical laboratory testing.  The controls stained appropriately.       Assessment & Plan:   Problem List Items Addressed This Visit   None Visit Diagnoses     Verrucous skin lesion    -  Primary   Verruca vulgaris           Removed sutures, looks well,  place moisturizing cream on it otherwise return as needed Follow up plan: Return if symptoms worsen or fail to improve.  Counseling provided for all of the vaccine components No orders of the defined types were placed in this encounter.   Caryl Pina, MD [facility] 12/28/2021, 9:25 AM

## 2022-01-09 NOTE — Progress Notes (Signed)
[facility] Note  01/23/2022    CHIEF COMPLAINT Patient presents for Retina Follow Up  HISTORY OF PRESENT ILLNESS: Eric Key is a 65 y.o. male who presents to the clinic today for:   HPI     Retina Follow Up   Patient presents with  Wet AMD.  In left eye.  Severity is moderate.  Duration of 4 weeks.  Since onset it is stable.  I, the attending physician,  performed the HPI with the patient and updated documentation appropriately.        Comments   Pt here for 4 wk ret f/u exu ARMD OS. Pt states VA the same.       Last edited by [name], MD on 01/26/2022 12:21 AM.    Pt states no change in vision, pt has been using AT's every day  Referring physician: [name], [name], MD [place],  [place] [id number]  HISTORICAL INFORMATION:   Selected notes from the MEDICAL RECORD NUMBER Referred by Dr. [name] for concern of mac heme OS.   CURRENT MEDICATIONS: No current outpatient medications on file. (Ophthalmic Drugs)   No current facility-administered medications for this visit. (Ophthalmic Drugs)   Current Outpatient Medications (Other)  Medication Sig   buPROPion (ZYBAN) 150 MG 12 hr tablet Take 1 tablet (150 mg total) by mouth 2 (two) times daily.   meloxicam (MOBIC) 15 MG tablet Take 15 mg by mouth daily.   rosuvastatin (CRESTOR) 5 MG tablet Take 1 tablet (5 mg total) by mouth daily.   No current facility-administered medications for this visit. (Other)   REVIEW OF SYSTEMS: ROS   Positive for: Eyes Negative for: Constitutional, Gastrointestinal, Neurological, Skin, Genitourinary, Musculoskeletal, HENT, Endocrine, Cardiovascular, Respiratory, Psychiatric, Allergic/Imm, Heme/Lymph Last edited by [name], COT on 01/23/2022  7:48 AM.     ALLERGIES Allergies  Allergen Reactions   Penicillins Hives   PAST MEDICAL HISTORY Past Medical History:  Diagnosis Date   Cataract    Mixed form OU   History  of adenomatous polyps of colon 03/16/2020   Hypertension    past hx    Hypertensive retinopathy    OU   Macular degeneration    Dry OD; Wet OS   Past Surgical History:  Procedure Laterality Date   ACHILLES TENDON REPAIR  2010   CHOLECYSTECTOMY     COLONOSCOPY     GALLBLADDER SURGERY     FAMILY HISTORY Family History  Problem Relation Age of Onset   Diabetes Brother    Heart disease Brother    Diabetes Maternal Grandfather    Colon polyps Neg Hx    Colon cancer Neg Hx    Esophageal cancer Neg Hx    Rectal cancer Neg Hx    Stomach cancer Neg Hx    SOCIAL HISTORY Social History   Tobacco Use   Smoking status: Some Days    Types: Cigarettes, Cigars    Last attempt to quit: 02/05/2020    Years since quitting: 1.9   Smokeless tobacco: Never  Vaping Use   Vaping Use: Never used  Substance Use Topics   Alcohol use: Yes    Comment: occ   Drug use: Never       OPHTHALMIC EXAM: Base Eye Exam     Visual Acuity (Snellen - Linear)       Right Left   Dist cc 20/30 20/30   Dist ph cc NI NI  Correction: Glasses         Tonometry (Tonopen, 7:54 AM)       Right Left   Pressure 9 12         Pupils       Dark Light Shape React APD   Right 3 2 Round Brisk None   Left 3 2 Round Brisk None         Visual Fields (Counting fingers)       Left Right    Full Full         Extraocular Movement       Right Left    Full, Ortho Full, Ortho         Neuro/Psych     Oriented x3: Yes   Mood/Affect: Normal         Dilation     Both eyes: 1.0% Mydriacyl, 2.5% Phenylephrine @ 7:55 AM           Slit Lamp and Fundus Exam     Slit Lamp Exam       Right Left   Lids/Lashes Dermatochalasis - upper lid Dermatochalasis - upper lid   Conjunctiva/Sclera White and quiet White and quiet   Cornea 1+ PEE, mild Arcus, trace tear film debris mild Arcus, 1-2+ inferior punctate epithelial erosions centrally, mild tear film debris   Anterior Chamber deep,  clear, narrow angles deep, clear, narrow angles   Iris Round and moderately dilated, mild anterior bowing Round and moderately dilated, mild anterior bowing   Lens 2-3+ Nuclear sclerosis, 2-3+ Cortical cataract 2-3+ Nuclear sclerosis, 2-3+ Cortical cataract   Anterior Vitreous Vitreous syneresis Vitreous syneresis         Fundus Exam       Right Left   Disc Pink and Sharp, focal PPP ST, Compact Pink and Sharp, Compact   C/D Ratio 0.2 0.3   Macula Flat, Drusen, RPE mottling and clumping, early Atrophy, punctate IRH Abnormal foveal reflex, +CNV with heme improved, +central edema / cystic changes -- slightly improved, Drusen, RPE mottling and clumping, early subretinal fibrosis, punctate IRH   Vessels attenuated, mild tortuosity, perivascular pigment clumping peripherally attenuated, mild tortuosity, perivascular pigment clumping peripherally   Periphery Attached, peripheral pigmented CR scarring and perivascular bone spicules 360 Attached, peripheral pigmented CR scarring and perivascular bone spicules 360           Refraction     Wearing Rx       Sphere Cylinder Axis Add   Right [place] +1.25 177 +2.25   Left [place] +0.75 170 +2.25         Manifest Refraction       Sphere Cylinder Axis Dist VA   Right [place] +1.25 180 20/20   Left               IMAGING AND PROCEDURES  Imaging and Procedures for _0 @  OCT, Retina - OU - Both Eyes       Right Eye Quality was borderline. Central Foveal Thickness: 304. Progression has been stable. Findings include normal foveal contour, no IRF, no SRF, retinal drusen , pigment epithelial detachment, outer retinal atrophy (Persistent vitreous opacities; stable low lying PEDs and ORA).   Left Eye Quality was borderline. Central Foveal Thickness: 310. Progression has improved. Findings include abnormal foveal contour, retinal drusen , subretinal hyper-reflective material, intraretinal fluid, pigment epithelial detachment, outer retinal  atrophy (Mild interval improvement in IRF temporal fovea and macula ).   Notes *Images captured and stored on drive  Diagnosis / Impression:  OD: non-exu ARMD w/ stable low lying PEDs and ORA OS: exu ARMD -- Mild interval improvement in IRF temporal fovea and macula   Clinical management:  See below  Abbreviations: NFP - Normal foveal profile. CME - cystoid macular edema. PED - pigment epithelial detachment. IRF - intraretinal fluid. SRF - subretinal fluid. EZ - ellipsoid zone. ERM - epiretinal membrane. ORA - outer retinal atrophy. ORT - outer retinal tubulation. SRHM - subretinal hyper-reflective material      Intravitreal Injection, Pharmacologic Agent - OS - Left Eye       Time Out 01/23/2022. 8:24 AM. Confirmed correct patient, procedure, site, and patient consented.   Anesthesia Topical anesthesia was used. Anesthetic medications included Lidocaine 2%, Proparacaine 0.5%.   Procedure Preparation included 5% betadine to ocular surface, eyelid speculum. A (32g) needle was used.   Injection: 6 mg faricimab-svoa 6 MG/0.05ML   Route: Intravitreal, Site: Left Eye   NDC: [id number], Lot: [id number], Expiration date: 11/14/2023, Waste: 0 mL   Post-op Post injection exam found visual acuity of at least counting fingers. The patient tolerated the procedure well. There were no complications. The patient received written and verbal post procedure care education. Post injection medications were not given.            ASSESSMENT/PLAN:   ICD-10-CM   1. Exudative age-related macular degeneration of left eye with active choroidal neovascularization (HCC)  H35.3221 OCT, Retina - OU - Both Eyes    Intravitreal Injection, Pharmacologic Agent - OS - Left Eye    faricimab-svoa (VABYSMO) 14m/0.05mL intravitreal injection    2. 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 Essential hypertension  I10      5. Hypertensive retinopathy of both eyes  H35.033     6. Combined forms of age-related cataract of both eyes  H25.813      1. 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, OS  - s/p IVA OS #1 (03.04.21), #2 (03.04.21), #3 (04.30.21) -- IVA resistance  - s/p IVE OS #1 (06.04.21), #2 (07.08.21), #3 (08.09.21), #4 (09.13.21), #5 (10.18.21), #6 (11.24.21), #7 (12.31.21), #8 (02.11.22), #9 (03.25.22), #10 (05.02.22), #11 (06.06.22), #12 (07.13.22), #13 (08.17.22), #14 (09.21.22), #15 (10.26.22), #16 (11.29.22), #17 (01.03.23), #18 (02.07.23), #19 (03.16.23), #20 (04.20.23), #21 (05.19.23), #22 (06.20.23) -- IVE resistance  - s/p IVV OS #1 (07/18.23), #2 (08.15.23), #3 (09.12.23)  - pt reports stable vision OS  - exam shows central CNV with +subretinal heme -- improved  - BCVA 20/30 OS (decreased)  - OCT shows mild interval improvement in IRF temporal fovea and macula at 4 weeks  - recommend IVV OS #4 today, 10.10.23, w/ f/u in 5 wks  - pt wishes to proceed  - RBA of procedure discussed, questions answered  - informed consent obtained and signed  - see procedure note  - IVA consent form signed and scanned, 03.04.21  - IVE consent form re-signed and scanned, 04.20.23  - IVV consent form signed and scanned, 07.18.23  - f/u in 5 wks -- DFE/OCT, possible injection  2. Age related macular degeneration, non-exudative, OD  - The incidence, anatomy, and pathology of dry AMD, risk of progression, and the AREDS and AREDS 2 study including smoking risks discussed with patient.   - recommend Amsler grid monitoring  3. Pigmentary retinopathy OU  - peripheral pigmented CR scarring and perivascular pigment clumping / bone spicules OU  -  VA remains fairly good (20/20 OD, 20/40 OS with exudative ARMD OS)  - denies night blindness  - denies family history of vision problems  - discussed findings  - broad differential for pigmentary retinopathy:  Congenital (RP), infectious (TB, lyme dx, syphilis, toxo,  bartonella), autoimmune and toxic etiologies  - labs checked (4.2.21):  All essential WNL   Rheumatoid factor   CBC   Comprehensive metabolic panel     RPR   ANA   ESR, CRP   ACE   ANCA   Toxoplasma antibodies IgG / IgM   Quantiferon - TB Gold Plus   HLA, A, B, C (IR)   Fluorescent treponemal ab (fla) - IgG - bld   VDRL Serum   Bartonella antibody, 06.04.21  - [facility] -- drawn on 6.4.21                         Results Positive for AGBL5, ALMS1, OPA1 -- unknown significance              - f/u in 4-5 weeks  4,5. Hypertensive retinopathy OU  - discussed importance of tight BP control  - continue to monitor  6. Mixed form age related cataracts OU  - The symptoms of cataract, surgical options, and treatments and risks were discussed with patient.  - discussed diagnosis and progression  - not yet visually significant  - continue to monitor  Ophthalmic Meds Ordered this visit:  Meds ordered this encounter  Medications   faricimab-svoa (VABYSMO) 58m/0.05mL intravitreal injection     Return in about 5 weeks (around 02/27/2022) for f/u exu ARMD OS, DFE, OCT.  There are no Patient Instructions on file for this visit.  This document serves as a record of services personally performed by BGardiner Sleeper MD, PhD. It was created on their behalf by CRenaldo Reel [place]an ophthalmic technician. The creation of this record is the provider's dictation and/or activities during the visit.    Electronically signed by:  CRenaldo Reel COT  09.26.23 12:21 AM   This document serves as a record of services personally performed by BGardiner Sleeper MD, PhD. It was created on their behalf by ASan Jetty BOwens Shark OA an ophthalmic technician. The creation of this record is the provider's dictation and/or activities during the visit.    Electronically signed by: ASan Jetty B[place] O[place]10.10.2023 12:21 AM  BGardiner Sleeper M.D., Ph.D. Diseases & Surgery of the Retina and  Vitreous [facility] [place] [place] I have reviewed the above documentation for accuracy and completeness, and I agree with the above. BGardiner Sleeper M.D., Ph.D. 01/26/22 12:22 AM   Abbreviations: M myopia (nearsighted); A astigmatism; H hyperopia (farsighted); P presbyopia; Mrx spectacle prescription;  CTL contact lenses; OD right eye; OS left eye; OU both eyes  XT exotropia; ET esotropia; PEK punctate epithelial keratitis; PEE punctate epithelial erosions; DES dry eye syndrome; MGD meibomian gland dysfunction; ATs artificial tears; PFAT's preservative free artificial tears; [place] sclerotic cataract; PSC posterior subcapsular cataract; ERM epi-retinal membrane; PVD posterior vitreous detachment; RD retinal detachment; DM diabetes mellitus; DR diabetic retinopathy; NPDR non-proliferative diabetic retinopathy; PDR proliferative diabetic retinopathy; CSME clinically significant macular edema; DME diabetic macular edema; dbh dot blot hemorrhages; CWS cotton wool spot; POAG primary open angle glaucoma; C/D cup-to-disc ratio; HVF humphrey visual field; GVF goldmann visual field; OCT optical coherence tomography; IOP intraocular pressure; BRVO Branch retinal vein occlusion; CRVO central retinal vein  occlusion; CRAO central retinal artery occlusion; BRAO branch retinal artery occlusion; RT retinal tear; SB scleral buckle; PPV pars plana vitrectomy; VH Vitreous hemorrhage; PRP panretinal laser photocoagulation; IVK intravitreal kenalog; VMT vitreomacular traction; MH Macular hole;  NVD neovascularization of the disc; NVE neovascularization elsewhere; AREDS age related eye disease study; ARMD age related macular degeneration; POAG primary open angle glaucoma; EBMD epithelial/anterior basement membrane dystrophy; ACIOL anterior chamber intraocular lens; IOL intraocular lens; PCIOL posterior chamber intraocular lens; Phaco/IOL phacoemulsification with intraocular lens placement; [place] photorefractive  keratectomy; LASIK laser assisted in situ keratomileusis; HTN hypertension; DM diabetes mellitus; COPD chronic obstructive pulmonary disease

## 2022-01-23 ENCOUNTER — Ambulatory Visit (INDEPENDENT_AMBULATORY_CARE_PROVIDER_SITE_OTHER): Payer: BC Managed Care – PPO | Admitting: Ophthalmology

## 2022-01-23 ENCOUNTER — Encounter (INDEPENDENT_AMBULATORY_CARE_PROVIDER_SITE_OTHER): Payer: Self-pay | Admitting: Ophthalmology

## 2022-01-23 DIAGNOSIS — H353112 Nonexudative age-related macular degeneration, right eye, intermediate dry stage: Secondary | ICD-10-CM

## 2022-01-23 DIAGNOSIS — H35033 Hypertensive retinopathy, bilateral: Secondary | ICD-10-CM | POA: Diagnosis not present

## 2022-01-23 DIAGNOSIS — H353221 Exudative age-related macular degeneration, left eye, with active choroidal neovascularization: Secondary | ICD-10-CM | POA: Diagnosis not present

## 2022-01-23 DIAGNOSIS — I10 Essential (primary) hypertension: Secondary | ICD-10-CM | POA: Diagnosis not present

## 2022-01-23 DIAGNOSIS — H25813 Combined forms of age-related cataract, bilateral: Secondary | ICD-10-CM

## 2022-01-23 DIAGNOSIS — H3552 Pigmentary retinal dystrophy: Secondary | ICD-10-CM | POA: Diagnosis not present

## 2022-01-23 MED ORDER — FARICIMAB-SVOA 6 MG/0.05ML IZ SOLN
6.0000 mg | INTRAVITREAL | Status: AC | PRN
  Administered 2022-01-23: 6 mg via INTRAVITREAL

## 2022-02-13 NOTE — Progress Notes (Signed)
[facility] Note  02/27/2022    CHIEF COMPLAINT Patient presents for Retina Follow Up  HISTORY OF PRESENT ILLNESS: Eric Key is a 66 y.o. male who presents to the clinic today for:   HPI     Retina Follow Up   Patient presents with  Wet AMD.  In left eye.  This started 5 weeks ago.  Severity is moderate.  Duration of 5 weeks.  Since onset it is stable.  I, the attending physician,  performed the HPI with the patient and updated documentation appropriately.        Comments   Retina follow up EX AMD OS AND IVV OS pt is reporting no vision changes noticed       Last edited by [name], MD on 02/28/2022 12:08 AM.     Pt states the scratch on his eye feels better today  Referring physician: [name], [name], MD [place],  [place] [address]  HISTORICAL INFORMATION:   Selected notes from the [facility] Referred by Dr. [name] for concern of mac heme OS.   CURRENT MEDICATIONS: Current Outpatient Medications (Ophthalmic Drugs)  Medication Sig   ofloxacin (OCUFLOX) 0.3 % ophthalmic solution Place 1 drop into the left eye 4 (four) times daily for 10 days.   No current facility-administered medications for this visit. (Ophthalmic Drugs)   Current Outpatient Medications (Other)  Medication Sig   buPROPion (ZYBAN) 150 MG 12 hr tablet Take 1 tablet (150 mg total) by mouth 2 (two) times daily.   meloxicam (MOBIC) 15 MG tablet Take 15 mg by mouth daily.   rosuvastatin (CRESTOR) 5 MG tablet Take 1 tablet (5 mg total) by mouth daily.   No current facility-administered medications for this visit. (Other)   REVIEW OF SYSTEMS:   ALLERGIES Allergies  Allergen Reactions   Penicillins Hives   PAST MEDICAL HISTORY Past Medical History:  Diagnosis Date   Cataract    Mixed form OU   History of adenomatous polyps of colon 03/16/2020   Hypertension    past hx    Hypertensive retinopathy    OU   Macular  degeneration    Dry OD; Wet OS   Past Surgical History:  Procedure Laterality Date   ACHILLES TENDON REPAIR  2010   CHOLECYSTECTOMY     COLONOSCOPY     GALLBLADDER SURGERY     FAMILY HISTORY Family History  Problem Relation Age of Onset   Diabetes Brother    Heart disease Brother    Diabetes Maternal Grandfather    Colon polyps Neg Hx    Colon cancer Neg Hx    Esophageal cancer Neg Hx    Rectal cancer Neg Hx    Stomach cancer Neg Hx    SOCIAL HISTORY Social History   Tobacco Use   Smoking status: Some Days    Types: Cigarettes, Cigars    Last attempt to quit: 02/05/2020    Years since quitting: 2.0   Smokeless tobacco: Never  Vaping Use   Vaping Use: Never used  Substance Use Topics   Alcohol use: Yes    Comment: occ   Drug use: Never       OPHTHALMIC EXAM: Base Eye Exam     Visual Acuity (Snellen - Linear)       Right Left   Dist cc 20/30 20/30 -2   Dist ph cc NI NI    Correction: Glasses  Tonometry (Tonopen, 7:49 AM)       Right Left   Pressure 15 13         Pupils       Pupils Dark Light Shape React APD   Right PERRL 3 2 Round Sluggish None   Left PERRL 3 2 Round Sluggish None         Visual Fields       Left Right    Full Full         Extraocular Movement       Right Left    Full, Ortho Full, Ortho         Neuro/Psych     Oriented x3: Yes   Mood/Affect: Normal         Dilation     Both eyes: 2.5% Phenylephrine @ 7:49 AM           Slit Lamp and Fundus Exam     Slit Lamp Exam       Right Left   Lids/Lashes Dermatochalasis - upper lid Dermatochalasis - upper lid   Conjunctiva/Sclera White and quiet White and quiet   Cornea 1-2+ PEE, mild Arcus, trace tear film debris mild Arcus, 2+ inferior punctate epithelial erosions centrally, mild tear film debris, 2.44m epi defect at 0230 limbus -- closed   Anterior Chamber deep, clear, narrow angles deep, clear, narrow angles   Iris Round and moderately  dilated, mild anterior bowing Round and moderately dilated, mild anterior bowing   Lens 2-3+ Nuclear sclerosis, 2-3+ Cortical cataract 2-3+ Nuclear sclerosis, 2-3+ Cortical cataract   Anterior Vitreous Vitreous syneresis Vitreous syneresis         Fundus Exam       Right Left   Disc Pink and Sharp, focal PPP ST, Compact Pink and Sharp, Compact   C/D Ratio 0.2 0.4   Macula Flat, Drusen, RPE mottling and clumping, early Atrophy, punctate IRH - improved Abnormal foveal reflex, +CNV with heme improved, +central edema / cystic changes -- slightly increased, Drusen, RPE mottling and clumping, early subretinal fibrosis, punctate IRH - improved   Vessels attenuated, mild tortuosity, perivascular pigment clumping peripherally attenuated, mild tortuosity, perivascular pigment clumping peripherally   Periphery Attached, peripheral pigmented CR scarring and perivascular bone spicules 360 Attached, peripheral pigmented CR scarring and perivascular bone spicules 360           Refraction     Wearing Rx       Sphere Cylinder Axis Add   Right [place] +1.25 177 +2.25   Left [place] +0.75 170 +2.25           IMAGING AND PROCEDURES  Imaging and Procedures for _0 @  OCT, Retina - OU - Both Eyes       Right Eye Quality was borderline. Central Foveal Thickness: 306. Progression has been stable. Findings include normal foveal contour, no IRF, no SRF, retinal drusen , pigment epithelial detachment, outer retinal atrophy (Persistent vitreous opacities; stable low lying PEDs and ORA, partial PVD).   Left Eye Quality was borderline. Central Foveal Thickness: 311. Progression has worsened. Findings include abnormal foveal contour, retinal drusen , subretinal hyper-reflective material, intraretinal fluid, pigment epithelial detachment, outer retinal atrophy (Mild interval increase in IRF overlying stable low-lying PED / [facility]   Notes *Images captured and stored on drive  Diagnosis / Impression:   OD: non-exu ARMD w/ stable low lying PEDs and ORA OS: exu ARMD -- Mild interval increase in IRF overlying [facility] low-lying [facility]ED / [facility]  [facility]:  See below  Abbreviations: NFP - Normal foveal profile. CME - cystoid macular edema. PED - pigment epithelial detachment. IRF - intraretinal fluid. SRF - subretinal fluid. EZ - ellipsoid zone. ERM - epiretinal membrane. ORA - outer retinal atrophy. ORT - outer retinal tubulation. SRHM - subretinal hyper-reflective material      Intravitreal Injection, Pharmacologic Agent - OS - Left Eye       Time Out 02/27/2022. 7:52 AM. Confirmed correct patient, procedure, site, and patient consented.   Anesthesia Topical anesthesia was used. Anesthetic medications included Lidocaine 2%, Proparacaine 0.5%.   Procedure Preparation included 5% betadine to ocular surface, eyelid speculum. A (32g) needle was used.   Injection: 6 mg faricimab-svoa 6 MG/0.05ML   Route: Intravitreal, Site: Left Eye   NDC: 76226-333-54, Lot: [id number], Expiration date: 11/13/2023, Waste: 0 mL   Post-op Post injection exam found visual acuity of at least counting fingers. The patient tolerated the procedure well. There were no complications. The patient received written and verbal post procedure care education. Post injection medications were not given.            ASSESSMENT/PLAN:   ICD-10-CM   1. Exudative age-related macular degeneration of left eye with active choroidal neovascularization (HCC)  H35.3221 OCT, Retina - OU - Both Eyes    Intravitreal Injection, Pharmacologic Agent - OS - Left Eye    faricimab-svoa (VABYSMO) 27m/0.05mL intravitreal injection    2. 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 Essential hypertension  I10     5. Hypertensive retinopathy of both eyes  H35.033     6. Combined forms of age-related cataract of both eyes  H25.813      1.  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, OS  - s/p IVA OS #1 (03.04.21), #2 (03.04.21), #3 (04.30.21) -- IVA resistance  - s/p IVE OS #1 (06.04.21), #2 (07.08.21), #3 (08.09.21), #4 (09.13.21), #5 (10.18.21), #6 (11.24.21), #7 (12.31.21), #8 (02.11.22), #9 (03.25.22), #10 (05.02.22), #11 (06.06.22), #12 (07.13.22), #13 (08.17.22), #14 (09.21.22), #15 (10.26.22), #16 (11.29.22), #17 (01.03.23), #18 (02.07.23), #19 (03.16.23), #20 (04.20.23), #21 (05.19.23), #22 (06.20.23) -- IVE resistance  - s/p IVV OS #1 (07/18.23), #2 (08.15.23), #3 (09.12.23), #4 (10.10.23)  - pt reports stable vision OS  - exam shows central CNV with +subretinal heme -- improved  - BCVA 20/30 OS (decreased)  - OCT shows Mild interval increase in IRF overlying stable low-lying PED / SRHM at 4 weeks  - recommend IVV OS #5 today, 11.14.23, w/ f/u in 5 wks  - pt wishes to proceed  - RBA of procedure discussed, questions answered  - informed consent obtained and signed  - see procedure note  - IVA consent form signed and scanned, 03.04.21  - IVE consent form re-signed and scanned, 04.20.23  - IVV consent form signed and scanned, 07.18.23  - f/u in 5 wks -- DFE/OCT, possible injection  2. Age related macular degeneration, non-exudative, OD  - The incidence, anatomy, and pathology of dry AMD, risk of progression, and the AREDS and AREDS 2 study including smoking risks discussed with patient.   - recommend Amsler grid monitoring  3. Pigmentary retinopathy OU  - peripheral pigmented CR scarring and perivascular pigment clumping / bone spicules OU  - VA remains fairly good (20/20 OD, 20/40 OS with exudative ARMD OS)  - denies night blindness  - denies family history of vision problems  -  discussed findings  - broad differential for pigmentary retinopathy:  Congenital (RP), infectious (TB, lyme dx, syphilis, toxo, bartonella), autoimmune and toxic etiologies  - labs checked (4.2.21):  All essential WNL   Rheumatoid  factor   CBC   Comprehensive metabolic panel     RPR   ANA   ESR, CRP   ACE   ANCA   Toxoplasma antibodies IgG / IgM   Quantiferon - TB Gold Plus   HLA, A, B, C (IR)   Fluorescent treponemal ab (fla) - IgG - bld   VDRL Serum   Bartonella antibody, 06.04.21  - [facility] -- drawn on 6.4.21                         Results Positive for AGBL5, ALMS1, OPA1 -- unknown significance              - f/u in 4-5 weeks  4,5. Hypertensive retinopathy OU  - discussed importance of tight BP control  - continue to monitor  6. Mixed form age related cataracts OU  - The symptoms of cataract, surgical options, and treatments and risks were discussed with patient.  - discussed diagnosis and progression  - not yet visually significant  - continue to monitor  Ophthalmic Meds Ordered this visit:  Meds ordered this encounter  Medications   faricimab-svoa (VABYSMO) 19m/0.05mL intravitreal injection     Return in about 5 weeks (around 04/03/2022) for f/u exu ARMD OS, DFE, OCT.  There are no Patient Instructions on file for this visit.  This document serves as a record of services personally performed by BGardiner Sleeper MD, PhD. It was created on their behalf by CRenaldo Reel CSuttonan ophthalmic technician. The creation of this record is the provider's dictation and/or activities during the visit.    Electronically signed by:  CRenaldo Reel COT  10.31.23 12:10 AM   This document serves as a record of services personally performed by BGardiner Sleeper MD, PhD. It was created on their behalf by ASan Jetty BOwens Shark OA an ophthalmic technician. The creation of this record is the provider's dictation and/or activities during the visit.    Electronically signed by: ASan Jetty BOwens Shark ONew York11.14.2023 12:10 AM   BGardiner Sleeper M.D., Ph.D. Diseases & Surgery of the Retina and Vitreous [facility] I have reviewed the above documentation for accuracy and  completeness, and I agree with the above. BGardiner Sleeper M.D., Ph.D. 02/28/22 12:12 AM  Abbreviations: M myopia (nearsighted); A astigmatism; H hyperopia (farsighted); P presbyopia; Mrx spectacle prescription;  CTL contact lenses; OD right eye; OS left eye; OU both eyes  XT exotropia; ET esotropia; PEK punctate epithelial keratitis; PEE punctate epithelial erosions; DES dry eye syndrome; MGD meibomian gland dysfunction; ATs artificial tears; PFAT's preservative free artificial tears; NAl[place]uanuclear sclerotic cataract; PSC posterior subcapsular cataract; ERM epi-retinal membrane; PVD posterior vitreous detachment; RD retinal detachment; DM diabetes mellitus; DR diabetic retinopathy; NPDR non-proliferative diabetic retinopathy; PDR proliferative diabetic retinopathy; CSME clinically significant macular edema; DME diabetic macular edema; dbh dot blot hemorrhages; CWS cotton wool spot; POAG primary open angle glaucoma; C/D cup-to-disc ratio; HVF humphrey visual field; GVF goldmann visual field; OCT optical coherence tomography; IOP intraocular pressure; BRVO Branch retinal vein occlusion; CRVO central retinal vein occlusion; CRAO central retinal artery occlusion; BRAO branch retinal artery occlusion; RT retinal tear; SB scleral buckle; PPV pars plana vitrectomy; VH Vitreous hemorrhage; PRP panretinal laser  photocoagulation; IVK intravitreal kenalog; VMT vitreomacular traction; MH Macular hole;  NVD neovascularization of the disc; NVE neovascularization elsewhere; AREDS age related eye disease study; ARMD age related macular degeneration; POAG primary open angle glaucoma; EBMD epithelial/anterior basement membrane dystrophy; ACIOL anterior chamber intraocular lens; IOL intraocular lens; PCIOL posterior chamber intraocular lens; Phaco/IOL phacoemulsification with intraocular lens placement; [place] photorefractive keratectomy; LASIK laser assisted in situ keratomileusis; HTN hypertension; DM diabetes mellitus; COPD chronic  obstructive pulmonary disease

## 2022-02-19 ENCOUNTER — Ambulatory Visit (INDEPENDENT_AMBULATORY_CARE_PROVIDER_SITE_OTHER): Payer: BC Managed Care – PPO | Admitting: Ophthalmology

## 2022-02-19 ENCOUNTER — Encounter (INDEPENDENT_AMBULATORY_CARE_PROVIDER_SITE_OTHER): Payer: Self-pay | Admitting: Ophthalmology

## 2022-02-19 DIAGNOSIS — H353112 Nonexudative age-related macular degeneration, right eye, intermediate dry stage: Secondary | ICD-10-CM

## 2022-02-19 DIAGNOSIS — I10 Essential (primary) hypertension: Secondary | ICD-10-CM

## 2022-02-19 DIAGNOSIS — H353221 Exudative age-related macular degeneration, left eye, with active choroidal neovascularization: Secondary | ICD-10-CM

## 2022-02-19 DIAGNOSIS — H35033 Hypertensive retinopathy, bilateral: Secondary | ICD-10-CM

## 2022-02-19 DIAGNOSIS — H1830 Unspecified corneal membrane change: Secondary | ICD-10-CM | POA: Diagnosis not present

## 2022-02-19 DIAGNOSIS — H25813 Combined forms of age-related cataract, bilateral: Secondary | ICD-10-CM

## 2022-02-19 DIAGNOSIS — H3552 Pigmentary retinal dystrophy: Secondary | ICD-10-CM

## 2022-02-19 MED ORDER — OFLOXACIN 0.3 % OP SOLN: 1.0000 [drp] | Freq: Four times a day (QID) | OPHTHALMIC | 0 refills | Status: AC

## 2022-02-19 NOTE — Progress Notes (Signed)
[facility] Note  02/19/2022    CHIEF COMPLAINT Patient presents for Retina Follow Up  HISTORY OF PRESENT ILLNESS: Eric Key is a 66 y.o. male who presents to the clinic today for:   HPI     Retina Follow Up   Patient presents with  Wet AMD.  In left eye.  This started 3.  Severity is moderate.  Duration of 3 weeks.  Since onset it is stable.  I, the attending physician,  performed the HPI with the patient and updated documentation appropriately.        Comments   Retina follow up PT states he has noticed his vision is more blurred in his left eye for the past week it feels like someone is squeezing his eye he denies flashes or floaters he is having some redness and watering       Last edited by [name], MD on 02/19/2022  4:40 PM.    Pt feels like vision in the left eye has decreased over the past couple of weeks, he also feels like someone has been "squeezing" his eye, he states if he presses on his eye, it helps relieve the pain  Referring physician: [name], [name], MD [place],  [place] [address]  HISTORICAL INFORMATION:   Selected notes from the [facility] Referred by Dr. [name] for concern of mac heme OS.   CURRENT MEDICATIONS: Current Outpatient Medications (Ophthalmic Drugs)  Medication Sig   ofloxacin (OCUFLOX) 0.3 % ophthalmic solution Place 1 drop into the left eye 4 (four) times daily for 10 days.   No current facility-administered medications for this visit. (Ophthalmic Drugs)   Current Outpatient Medications (Other)  Medication Sig   buPROPion (ZYBAN) 150 MG 12 hr tablet Take 1 tablet (150 mg total) by mouth 2 (two) times daily.   meloxicam (MOBIC) 15 MG tablet Take 15 mg by mouth daily.   rosuvastatin (CRESTOR) 5 MG tablet Take 1 tablet (5 mg total) by mouth daily.   No current facility-administered medications for this visit. (Other)   REVIEW OF SYSTEMS:   ALLERGIES Allergies   Allergen Reactions   Penicillins Hives   PAST MEDICAL HISTORY Past Medical History:  Diagnosis Date   Cataract    Mixed form OU   History of adenomatous polyps of colon 03/16/2020   Hypertension    past hx    Hypertensive retinopathy    OU   Macular degeneration    Dry OD; Wet OS   Past Surgical History:  Procedure Laterality Date   ACHILLES TENDON REPAIR  2010   CHOLECYSTECTOMY     COLONOSCOPY     GALLBLADDER SURGERY     FAMILY HISTORY Family History  Problem Relation Age of Onset   Diabetes Brother    Heart disease Brother    Diabetes Maternal Grandfather    Colon polyps Neg Hx    Colon cancer Neg Hx    Esophageal cancer Neg Hx    Rectal cancer Neg Hx    Stomach cancer Neg Hx    SOCIAL HISTORY Social History   Tobacco Use   Smoking status: Some Days    Types: Cigarettes, Cigars    Last attempt to quit: 02/05/2020    Years since quitting: 2.0   Smokeless tobacco: Never  Vaping Use   Vaping Use: Never used  Substance Use Topics   Alcohol use: Yes    Comment: occ   Drug use:  Never       OPHTHALMIC EXAM: Base Eye Exam     Visual Acuity (Snellen - Linear)       Right Left   Dist cc 20/25 20/30 -2   Dist ph cc NI NI    Correction: Glasses         Tonometry (Tonopen, 9:03 AM)       Right Left   Pressure 16 12         Pupils       Pupils Dark Light Shape React APD   Right PERRL 3 2 Round Brisk None   Left PERRL 3 2 Round Brisk None         Visual Fields       Left Right    Full Full         Extraocular Movement       Right Left    Full, Ortho Full, Ortho         Neuro/Psych     Oriented x3: Yes   Mood/Affect: Normal         Dilation     Both eyes: 2.5% Phenylephrine @ 9:03 AM           Slit Lamp and Fundus Exam     Slit Lamp Exam       Right Left   Lids/Lashes Dermatochalasis - upper lid Dermatochalasis - upper lid   Conjunctiva/Sclera White and quiet White and quiet   Cornea 1+ PEE, mild Arcus,  trace tear film debris mild Arcus, 1-2+ inferior punctate epithelial erosions centrally, mild tear film debris, 2.14m epi defect at 0230 limbus   Anterior Chamber deep, clear, narrow angles deep, clear, narrow angles   Iris Round and moderately dilated, mild anterior bowing Round and moderately dilated, mild anterior bowing   Lens 2-3+ Nuclear sclerosis, 2-3+ Cortical cataract 2-3+ Nuclear sclerosis, 2-3+ Cortical cataract   Anterior Vitreous Vitreous syneresis Vitreous syneresis         Fundus Exam       Right Left   Disc Pink and Sharp, focal PPP ST, Compact Pink and Sharp, Compact   C/D Ratio 0.2 0.3   Macula Flat, Drusen, RPE mottling and clumping, early Atrophy, punctate IRH Abnormal foveal reflex, +CNV with heme improved, +central edema / cystic changes -- slightly improved, Drusen, RPE mottling and clumping, early subretinal fibrosis, punctate IRH   Vessels attenuated, mild tortuosity, perivascular pigment clumping peripherally attenuated, mild tortuosity, perivascular pigment clumping peripherally   Periphery Attached, peripheral pigmented CR scarring and perivascular bone spicules 360 Attached, peripheral pigmented CR scarring and perivascular bone spicules 360           Refraction     Wearing Rx       Sphere Cylinder Axis Add   Right [place] +1.25 177 +2.25   Left [place] +0.75 170 +2.25           IMAGING AND PROCEDURES  Imaging and Procedures for _0 @  OCT, Retina - OU - Both Eyes       Right Eye Quality was borderline. Central Foveal Thickness: 303. Progression has been stable. Findings include normal foveal contour, no IRF, no SRF, retinal drusen , pigment epithelial detachment, outer retinal atrophy (Persistent vitreous opacities; stable low lying PEDs and ORA, partial PVD).   Left Eye Quality was borderline. Central Foveal Thickness: 310. Progression has improved. Findings include abnormal foveal contour, retinal drusen , subretinal hyper-reflective  material, intraretinal fluid, pigment epithelial detachment, outer retinal atrophy (Mild interval  improvement in IRF and overlying stable low-lying PED).   Notes *Images captured and stored on drive  Diagnosis / Impression:  OD: non-exu ARMD w/ stable low lying PEDs and ORA OS: exu ARMD -- Mild interval improvement in IRF and overlying stable low-lying PED  Clinical management:  See below  Abbreviations: NFP - Normal foveal profile. CME - cystoid macular edema. PED - pigment epithelial detachment. IRF - intraretinal fluid. SRF - subretinal fluid. EZ - ellipsoid zone. ERM - epiretinal membrane. ORA - outer retinal atrophy. ORT - outer retinal tubulation. 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 Exudative age-related macular degeneration of left eye with active choroidal neovascularization (HCC)  H35.3221 OCT, Retina - OU - Both Eyes    3. 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 Essential hypertension  I10     6. Hypertensive retinopathy of both eyes  H35.033     7. Combined forms of age-related cataract of both eyes  H25.813      **pt presents acutely for decreased VA / squeezing sensation OS for 2 weeks**  Corneal epi defect OS  - 2.68m epi defect at 0230 limbus  - will start ofloxacin QID OS  - f/u as scheduled next week  2. 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, OS  - s/p IVA OS #1 (03.04.21), #2 (03.04.21), #3 (04.30.21) -- IVA resistance  - s/p IVE OS #1 (06.04.21), #2 (07.08.21), #3 (08.09.21), #4 (09.13.21), #5 (10.18.21), #6 (11.24.21), #7 (12.31.21), #8 (02.11.22), #9 (03.25.22), #10 (05.02.22), #11 (06.06.22), #12 (07.13.22), #13 (08.17.22), #14 (09.21.22), #15 (10.26.22), #16 (11.29.22), #17 (01.03.23), #18 (02.07.23), #19 (03.16.23), #20 (04.20.23), #21 (05.19.23), #22  (06.20.23) -- IVE resistance  - s/p IVV OS #1 (07/18.23), #2 (08.15.23), #3 (09.12.23)  - pt reports stable vision OS  - exam shows central CNV with +subretinal heme -- improved  - BCVA 20/30 OS (decreased)  - OCT shows mild interval improvement in IRF and overlying stable low-lying PED  - IVA consent form signed and scanned, 03.04.21  - IVE consent form re-signed and scanned, 04.20.23  - IVV consent form signed and scanned, 07.18.23  - f/u as scheduled next week -- DFE/OCT, possible injection  3. Age related macular degeneration, non-exudative, OD  - The incidence, anatomy, and pathology of dry AMD, risk of progression, and the AREDS and AREDS 2 study including smoking risks discussed with patient.   - recommend Amsler grid monitoring  4. Pigmentary retinopathy OU  - peripheral pigmented CR scarring and perivascular pigment clumping / bone spicules OU  - VA remains fairly good (20/20 OD, 20/40 OS with exudative ARMD OS)  - denies night blindness  - denies family history of vision problems  - discussed findings  - broad differential for pigmentary retinopathy:  Congenital (RP), infectious (TB, lyme dx, syphilis, toxo, bartonella), autoimmune and toxic etiologies  - labs checked (4.2.21):  All essential WNL   Rheumatoid factor   CBC   Comprehensive metabolic panel     RPR   ANA   ESR, CRP   ACE   ANCA   Toxoplasma antibodies IgG / IgM   Quantiferon - TB Gold Plus   HLA, A, B, C (IR)   Fluorescent treponemal ab (fla) - IgG - bld  VDRL Serum   Bartonella antibody, 06.04.21  - [facility] -- drawn on 6.4.21                         Results Positive for AGBL5, ALMS1, OPA1 -- unknown significance              - f/u in 4-5 weeks  5,6. Hypertensive retinopathy OU  - discussed importance of tight BP control  - continue to monitor  7. Mixed form age related cataracts OU  - The symptoms of cataract, surgical options, and treatments and risks were discussed with  patient.  - discussed diagnosis and progression  - not yet visually significant  - continue to monitor    Ophthalmic Meds Ordered this visit:  Meds ordered this encounter  Medications   ofloxacin (OCUFLOX) 0.3 % ophthalmic solution    Sig: Place 1 drop into the left eye 4 (four) times daily for 10 days.    Dispense:  10 mL    Refill:  0     Return for f/u as scheduled .  There are no Patient Instructions on file for this visit.  This document serves as a record of services personally performed by Gardiner Sleeper, MD, PhD. It was created on their behalf by San Jetty. Owens Shark, OA an ophthalmic technician. The creation of this record is the provider's dictation and/or activities during the visit.    Electronically signed by: San Jetty. Marguerita Merles 11.06.2023 4:47 PM  Gardiner Sleeper, M.D., Ph.D. Diseases & Surgery of the Retina and Vitreous [facility]  I have reviewed the above documentation for accuracy and completeness, and I agree with the above. Gardiner Sleeper, M.D., Ph.D. 02/19/22 4:49 PM  Abbreviations: M myopia (nearsighted); A astigmatism; H hyperopia (farsighted); P presbyopia; Mrx spectacle prescription;  CTL contact lenses; OD right eye; OS left eye; OU both eyes  XT exotropia; ET esotropia; PEK punctate epithelial keratitis; PEE punctate epithelial erosions; DES dry eye syndrome; MGD meibomian gland dysfunction; ATs artificial tears; PFAT's preservative free artificial tears; [place] nuclear sclerotic cataract; PSC posterior subcapsular cataract; ERM epi-retinal membrane; PVD posterior vitreous detachment; RD retinal detachment; DM diabetes mellitus; DR diabetic retinopathy; NPDR non-proliferative diabetic retinopathy; PDR proliferative diabetic retinopathy; CSME clinically significant macular edema; DME diabetic macular edema; dbh dot blot hemorrhages; CWS cotton wool spot; POAG primary open angle glaucoma; C/D cup-to-disc ratio; HVF humphrey visual field; GVF  goldmann visual field; OCT optical coherence tomography; IOP intraocular pressure; BRVO Branch retinal vein occlusion; CRVO central retinal vein occlusion; CRAO central retinal artery occlusion; BRAO branch retinal artery occlusion; RT retinal tear; SB scleral buckle; PPV pars plana vitrectomy; VH Vitreous hemorrhage; PRP panretinal laser photocoagulation; IVK intravitreal kenalog; VMT vitreomacular traction; MH Macular hole;  NVD neovascularization of the disc; NVE neovascularization elsewhere; AREDS age related eye disease study; ARMD age related macular degeneration; POAG primary open angle glaucoma; EBMD epithelial/anterior basement membrane dystrophy; ACIOL anterior chamber intraocular lens; IOL intraocular lens; PCIOL posterior chamber intraocular lens; Phaco/IOL phacoemulsification with intraocular lens placement; [place] photorefractive keratectomy; LASIK laser assisted in situ keratomileusis; HTN hypertension; DM diabetes mellitus; COPD chronic obstructive pulmonary disease

## 2022-02-27 ENCOUNTER — Ambulatory Visit (INDEPENDENT_AMBULATORY_CARE_PROVIDER_SITE_OTHER): Payer: BC Managed Care – PPO | Admitting: Ophthalmology

## 2022-02-27 DIAGNOSIS — H25813 Combined forms of age-related cataract, bilateral: Secondary | ICD-10-CM

## 2022-02-27 DIAGNOSIS — H353112 Nonexudative age-related macular degeneration, right eye, intermediate dry stage: Secondary | ICD-10-CM

## 2022-02-27 DIAGNOSIS — I10 Essential (primary) hypertension: Secondary | ICD-10-CM | POA: Diagnosis not present

## 2022-02-27 DIAGNOSIS — H35033 Hypertensive retinopathy, bilateral: Secondary | ICD-10-CM

## 2022-02-27 DIAGNOSIS — H3552 Pigmentary retinal dystrophy: Secondary | ICD-10-CM

## 2022-02-27 DIAGNOSIS — H353221 Exudative age-related macular degeneration, left eye, with active choroidal neovascularization: Secondary | ICD-10-CM | POA: Diagnosis not present

## 2022-02-28 ENCOUNTER — Encounter (INDEPENDENT_AMBULATORY_CARE_PROVIDER_SITE_OTHER): Payer: Self-pay | Admitting: Ophthalmology

## 2022-02-28 MED ORDER — FARICIMAB-SVOA 6 MG/0.05ML IZ SOLN
6.0000 mg | INTRAVITREAL | Status: AC | PRN
  Administered 2022-02-28: 6 mg via INTRAVITREAL

## 2022-03-20 NOTE — Progress Notes (Shared)
[facility] Note  04/03/2022    CHIEF COMPLAINT Patient presents for No chief complaint on file.  HISTORY OF PRESENT ILLNESS: Eric Key is a 66 y.o. male who presents to the clinic today for:     Pt states the scratch on his eye feels better today  Referring physician: [name], [name], MD [place],  [place] [id number]  HISTORICAL INFORMATION:   Selected notes from the [facility] Referred by Dr. [name] for concern of mac heme OS.   CURRENT MEDICATIONS: No current outpatient medications on file. (Ophthalmic Drugs)   No current facility-administered medications for this visit. (Ophthalmic Drugs)   Current Outpatient Medications (Other)  Medication Sig   buPROPion (ZYBAN) 150 MG 12 hr tablet Take 1 tablet (150 mg total) by mouth 2 (two) times daily.   meloxicam (MOBIC) 15 MG tablet Take 15 mg by mouth daily.   rosuvastatin (CRESTOR) 5 MG tablet Take 1 tablet (5 mg total) by mouth daily.   No current facility-administered medications for this visit. (Other)   REVIEW OF SYSTEMS:   ALLERGIES Allergies  Allergen Reactions   Penicillins Hives   PAST MEDICAL HISTORY Past Medical History:  Diagnosis Date   Cataract    Mixed form OU   History of adenomatous polyps of colon 03/16/2020   Hypertension    past hx    Hypertensive retinopathy    OU   Macular degeneration    Dry OD; Wet OS   Past Surgical History:  Procedure Laterality Date   ACHILLES TENDON REPAIR  2010   CHOLECYSTECTOMY     COLONOSCOPY     GALLBLADDER SURGERY     FAMILY HISTORY Family History  Problem Relation Age of Onset   Diabetes Brother    Heart disease Brother    Diabetes Maternal Grandfather    Colon polyps Neg Hx    Colon cancer Neg Hx    Esophageal cancer Neg Hx    Rectal cancer Neg Hx    Stomach cancer Neg Hx    SOCIAL HISTORY Social History   Tobacco Use   Smoking status: Some Days    Types: Cigarettes, Cigars     Last attempt to quit: 02/05/2020    Years since quitting: 2.1   Smokeless tobacco: Never  Vaping Use   Vaping Use: Never used  Substance Use Topics   Alcohol use: Yes    Comment: occ   Drug use: Never       OPHTHALMIC EXAM: Not recorded    IMAGING AND PROCEDURES  Imaging and Procedures for _0 @          ASSESSMENT/PLAN:   ICD-10-CM   1. Exudative age-related macular degeneration of left eye with active choroidal neovascularization ([place])  H35.3221     2. 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 Essential hypertension  I10     5. Hypertensive retinopathy of both eyes  H35.033     6. Combined forms of age-related cataract of both eyes  H25.813       1. 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, OS  - s/p IVA OS #1 (03.04.21), #2 (03.04.21), #3 (04.30.21) -- IVA resistance  - s/p IVE OS #1 (06.04.21), #2 (07.08.21), #3 (08.09.21), #4 (09.13.21), #5 (10.18.21), #6 (11.24.21), #7 (12.31.21), #8 (02.11.22), #9 (03.25.22), #10 (05.02.22), #11 (06.06.22), #12 (07.13.22), #13 (08.17.22), #14 (  09.21.22), #15 (10.26.22), #16 (11.29.22), #17 (01.03.23), #18 (02.07.23), #19 (03.16.23), #20 (04.20.23), #21 (05.19.23), #22 (06.20.23) -- IVE resistance  - s/p IVV OS #1 (07/18.23), #2 (08.15.23), #3 (09.12.23), #4 (10.10.23), #5 (11.14.23)  - pt reports stable vision OS  - exam shows central CNV with +subretinal heme -- improved  - BCVA 20/30 OS (decreased)  - OCT shows Mild interval increase in IRF overlying stable low-lying PED / [facility] at 4 weeks  - recommend IVV OS #6 today, 12.19.23, w/ f/u in 5 wks  - pt wishes to proceed  - RBA of procedure discussed, questions answered  - informed consent obtained and signed  - see procedure note  - IVA consent form signed and scanned, 03.04.21  - IVE consent form re-signed and scanned, 04.20.23  - IVV consent form signed and scanned,  07.18.23  - f/u in 5 wks -- DFE/OCT, possible injection  2. Age related macular degeneration, non-exudative, OD  - The incidence, anatomy, and pathology of dry AMD, risk of progression, and the AREDS and AREDS 2 study including smoking risks discussed with patient.   - recommend Amsler grid monitoring  3. Pigmentary retinopathy OU  - peripheral pigmented CR scarring and perivascular pigment clumping / bone spicules OU  - VA remains fairly good (20/20 OD, 20/40 OS with exudative ARMD OS)  - denies night blindness  - denies family history of vision problems  - discussed findings  - broad differential for pigmentary retinopathy:  Congenital (RP), infectious (TB, lyme dx, syphilis, toxo, bartonella), autoimmune and toxic etiologies  - labs checked (4.2.21):  All essential WNL   Rheumatoid factor   CBC   Comprehensive metabolic panel     RPR   ANA   ESR, CRP   ACE   ANCA   Toxoplasma antibodies IgG / IgM   Quantiferon - TB Gold Plus   HLA, A, B, C (IR)   Fluorescent treponemal ab (fla) - IgG - bld   VDRL Serum   Bartonella antibody, 06.04.21  - [facility] -- drawn on 6.4.21                         Results Positive for AGBL5, ALMS1, OPA1 -- unknown significance              - f/u in 4-5 weeks  4,5. Hypertensive retinopathy OU  - discussed importance of tight BP control  - continue to monitor  6. Mixed form age related cataracts OU  - The symptoms of cataract, surgical options, and treatments and risks were discussed with patient.  - discussed diagnosis and progression  - not yet visually significant  - continue to monitor  Ophthalmic Meds Ordered this visit:  No orders of the defined types were placed in this encounter.    No follow-ups on file.  There are no Patient Instructions on file for this visit.  This document serves as a record of services personally performed by Gardiner Sleeper, MD, PhD. It was created on their behalf by Renaldo Reel, [place] an  ophthalmic technician. The creation of this record is the provider's dictation and/or activities during the visit.    Electronically signed by:  Renaldo Reel, COT  12.05.23 8:15 AM     Gardiner Sleeper, M.D., Ph.D. Diseases & Surgery of the Retina and Vitreous [facility]: M myopia (nearsighted); A astigmatism; H hyperopia (farsighted); P presbyopia; Mrx spectacle prescription;  CTL  contact lenses; OD right eye; OS left eye; OU both eyes  XT exotropia; ET esotropia; PEK punctate epithelial keratitis; PEE punctate epithelial erosions; DES dry eye syndrome; MGD meibomian gland dysfunction; ATs artificial tears; PFAT's preservative free artificial tears; [place] nuclear sclerotic cataract; PSC posterior subcapsular cataract; ERM epi-retinal membrane; PVD posterior vitreous detachment; RD retinal detachment; DM diabetes mellitus; DR diabetic retinopathy; NPDR non-proliferative diabetic retinopathy; PDR proliferative diabetic retinopathy; CSME clinically significant macular edema; DME diabetic macular edema; dbh dot blot hemorrhages; CWS cotton wool spot; POAG primary open angle glaucoma; C/D cup-to-disc ratio; HVF humphrey visual field; GVF goldmann visual field; OCT optical coherence tomography; IOP intraocular pressure; BRVO Branch retinal vein occlusion; CRVO central retinal vein occlusion; CRAO central retinal artery occlusion; BRAO branch retinal artery occlusion; RT retinal tear; SB scleral buckle; PPV pars plana vitrectomy; VH Vitreous hemorrhage; PRP panretinal laser photocoagulation; IVK intravitreal kenalog; VMT vitreomacular traction; MH Macular hole;  NVD neovascularization of the disc; NVE neovascularization elsewhere; AREDS age related eye disease study; ARMD age related macular degeneration; POAG primary open angle glaucoma; EBMD epithelial/anterior basement membrane dystrophy; ACIOL anterior chamber intraocular lens; IOL intraocular lens; PCIOL  posterior chamber intraocular lens; Phaco/IOL phacoemulsification with intraocular lens placement; [place] photorefractive keratectomy; LASIK laser assisted in situ keratomileusis; HTN hypertension; DM diabetes mellitus; COPD chronic obstructive pulmonary disease

## 2022-04-03 ENCOUNTER — Encounter (INDEPENDENT_AMBULATORY_CARE_PROVIDER_SITE_OTHER): Payer: BC Managed Care – PPO | Admitting: Ophthalmology

## 2022-04-03 DIAGNOSIS — H353221 Exudative age-related macular degeneration, left eye, with active choroidal neovascularization: Secondary | ICD-10-CM

## 2022-04-03 DIAGNOSIS — H3552 Pigmentary retinal dystrophy: Secondary | ICD-10-CM

## 2022-04-03 DIAGNOSIS — H25813 Combined forms of age-related cataract, bilateral: Secondary | ICD-10-CM

## 2022-04-03 DIAGNOSIS — I10 Essential (primary) hypertension: Secondary | ICD-10-CM

## 2022-04-03 DIAGNOSIS — H35033 Hypertensive retinopathy, bilateral: Secondary | ICD-10-CM

## 2022-04-03 DIAGNOSIS — H353112 Nonexudative age-related macular degeneration, right eye, intermediate dry stage: Secondary | ICD-10-CM

## 2022-04-03 NOTE — Progress Notes (Signed)
[facility] - Clinic Note  04/05/2022    CHIEF COMPLAINT Patient presents for Retina Follow Up  HISTORY OF PRESENT ILLNESS: Eric Key is a 66 y.o. male who presents to the clinic today for:   HPI     Retina Follow Up   Patient presents with  Wet AMD.  In left eye.  Severity is moderate.  Duration of 5 weeks.  Since onset it is stable.  I, the attending physician,  performed the HPI with the patient and updated documentation appropriately.        Comments   Patient states vision the same OU.      Last edited by Rennis Chris, MD on 04/05/2022  8:30 AM.    Pt states   Referring physician: [name], [name], MD [address] [place],  [place] [address]  HISTORICAL INFORMATION:   Selected notes from the MEDICAL RECORD NUMBER Referred by Dr. Ginette Otto for concern of mac heme OS.   CURRENT MEDICATIONS: No current outpatient medications on file. (Ophthalmic Drugs)   No current facility-administered medications for this visit. (Ophthalmic Drugs)   Current Outpatient Medications (Other)  Medication Sig   buPROPion (ZYBAN) 150 MG 12 hr tablet Take 1 tablet (150 mg total) by mouth 2 (two) times daily.   meloxicam (MOBIC) 15 MG tablet Take 15 mg by mouth daily.   rosuvastatin (CRESTOR) 5 MG tablet Take 1 tablet (5 mg total) by mouth daily.   No current facility-administered medications for this visit. (Other)   REVIEW OF SYSTEMS: ROS   Positive for: Eyes Negative for: Constitutional, Gastrointestinal, Neurological, Skin, Genitourinary, Musculoskeletal, HENT, Endocrine, Cardiovascular, Respiratory, Psychiatric, Allergic/Imm, Heme/Lymph Last edited by Annalee Genta D, COT on 04/05/2022  7:48 AM.     ALLERGIES Allergies  Allergen Reactions   Penicillins Hives   PAST MEDICAL HISTORY Past Medical History:  Diagnosis Date   Cataract    Mixed form OU   History of adenomatous polyps of colon 03/16/2020   Hypertension    past hx     Hypertensive retinopathy    OU   Macular degeneration    Dry OD; Wet OS   Past Surgical History:  Procedure Laterality Date   ACHILLES TENDON REPAIR  2010   CHOLECYSTECTOMY     COLONOSCOPY     GALLBLADDER SURGERY     FAMILY HISTORY Family History  Problem Relation Age of Onset   Diabetes Brother    Heart disease Brother    Diabetes Maternal Grandfather    Colon polyps Neg Hx    Colon cancer Neg Hx    Esophageal cancer Neg Hx    Rectal cancer Neg Hx    Stomach cancer Neg Hx    SOCIAL HISTORY Social History   Tobacco Use   Smoking status: Some Days    Types: Cigarettes, Cigars    Last attempt to quit: 02/05/2020    Years since quitting: 2.1   Smokeless tobacco: Never  Vaping Use   Vaping Use: Never used  Substance Use Topics   Alcohol use: Yes    Comment: occ   Drug use: Never       OPHTHALMIC EXAM: Base Eye Exam     Visual Acuity (Snellen - Linear)       Right Left   Dist cc 20/20 -2 20/30 -1   Dist ph cc  20/25         Tonometry (Tonopen, 7:55 AM)       Right  Left   Pressure 11 12         Pupils       Dark Light Shape React APD   Right 3 2 Round Brisk None   Left 3 2 Round Brisk None         Visual Fields (Counting fingers)       Left Right    Full Full         Extraocular Movement       Right Left    Full, Ortho Full, Ortho         Neuro/Psych     Oriented x3: Yes   Mood/Affect: Normal         Dilation     Both eyes: 1.0% Mydriacyl, 2.5% Phenylephrine @ 7:55 AM           Slit Lamp and Fundus Exam     Slit Lamp Exam       Right Left   Lids/Lashes Dermatochalasis - upper lid Dermatochalasis - upper lid   Conjunctiva/Sclera White and quiet White and quiet   Cornea 1-2+ PEE, mild Arcus, trace tear film debris mild Arcus, 2+ inferior punctate epithelial erosions centrally, mild tear film debris, 2.70mm epi defect at 0230 limbus -- closed   Anterior Chamber deep, clear, narrow angles deep, clear, narrow angles    Iris Round and moderately dilated, mild anterior bowing Round and moderately dilated, mild anterior bowing   Lens 2-3+ Nuclear sclerosis, 2-3+ Cortical cataract 2-3+ Nuclear sclerosis, 2-3+ Cortical cataract   Anterior Vitreous Vitreous syneresis Vitreous syneresis         Fundus Exam       Right Left   Disc Pink and Sharp, focal PPP ST, Compact Pink and Sharp, Compact   C/D Ratio 0.2 0.4   Macula Flat, Drusen, RPE mottling and clumping, early Atrophy, punctate IRH Abnormal foveal reflex, +CNV with heme improved, +central edema / cystic changes -- improved, Drusen, RPE mottling and clumping, early subretinal fibrosis, punctate IRH - improved   Vessels attenuated, mild tortuosity, perivascular pigment clumping peripherally attenuated, mild tortuosity, perivascular pigment clumping peripherally   Periphery Attached, peripheral pigmented CR scarring and perivascular bone spicules 360 Attached, peripheral pigmented CR scarring and perivascular bone spicules 360           Refraction     Wearing Rx       Sphere Cylinder Axis Add   Right [place] +1.25 177 +2.25   Left [place] +0.75 170 +2.25           IMAGING AND PROCEDURES  Imaging and Procedures for @TODAY @  OCT, Retina - OU - Both Eyes       Right Eye Quality was borderline. Central Foveal Thickness: 299. Progression has been stable. Findings include normal foveal contour, no IRF, no SRF, retinal drusen , pigment epithelial detachment, outer retinal atrophy (Persistent vitreous opacities; stable low lying PEDs and ORA, partial PVD).   Left Eye Quality was borderline. Central Foveal Thickness: 305. Progression has improved. Findings include abnormal foveal contour, retinal drusen , subretinal hyper-reflective material, intraretinal fluid, pigment epithelial detachment, outer retinal atrophy (interval improvement in IRF overlying stable low-lying PED / [facility] temporal fovea and macula).   Notes *Images captured and stored on  drive  Diagnosis / Impression:  OD: non-exu ARMD w/ stable low lying PEDs and ORA OS: exu ARMD -- interval improvement in IRF overlying stable low-lying PED / [facility] temporal fovea and macula  Clinical management:  See below  Abbreviations: NFP -  Normal foveal profile. CME - cystoid macular edema. PED - pigment epithelial detachment. IRF - intraretinal fluid. SRF - subretinal fluid. EZ - ellipsoid zone. ERM - epiretinal membrane. ORA - outer retinal atrophy. ORT - outer retinal tubulation. SRHM - subretinal hyper-reflective material      Intravitreal Injection, Pharmacologic Agent - OS - Left Eye       Time Out 04/05/2022. 8:16 AM. Confirmed correct patient, procedure, site, and patient consented.   Anesthesia Topical anesthesia was used. Anesthetic medications included Lidocaine 2%, Proparacaine 0.5%.   Procedure Preparation included 5% betadine to ocular surface, eyelid speculum. A (32g) needle was used.   Injection: 6 mg faricimab-svoa 6 MG/0.05ML   Route: Intravitreal, Site: Left Eye   NDC: O8010301, Lot: [id number], Expiration date: 04/14/2024, Waste: 0 mL   Post-op Post injection exam found visual acuity of at least counting fingers. The patient tolerated the procedure well. There were no complications. The patient received written and verbal post procedure care education. Post injection medications were not given.            ASSESSMENT/PLAN:   ICD-10-CM   1. Exudative age-related macular degeneration of left eye with active choroidal neovascularization (HCC)  H35.3221 OCT, Retina - OU - Both Eyes    Intravitreal Injection, Pharmacologic Agent - OS - Left Eye    faricimab-svoa (VABYSMO) 6mg /0.72mL intravitreal injection    2. 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 Essential hypertension  I10     5. Hypertensive retinopathy of both eyes  H35.033     6. Combined forms  of age-related cataract of both eyes  H25.813      1. 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, OS  - s/p IVA OS #1 (03.04.21), #2 (03.04.21), #3 (04.30.21) -- IVA resistance  - s/p IVE OS #1 (06.04.21), #2 (07.08.21), #3 (08.09.21), #4 (09.13.21), #5 (10.18.21), #6 (11.24.21), #7 (12.31.21), #8 (02.11.22), #9 (03.25.22), #10 (05.02.22), #11 (06.06.22), #12 (07.13.22), #13 (08.17.22), #14 (09.21.22), #15 (10.26.22), #16 (11.29.22), #17 (01.03.23), #18 (02.07.23), #19 (03.16.23), #20 (04.20.23), #21 (05.19.23), #22 (06.20.23) -- IVE resistance  - s/p IVV OS #1 (07/18.23), #2 (08.15.23), #3 (09.12.23), #4 (10.10.23), #5 (11.14.23)  - pt reports stable vision OS  - exam shows central CNV with +subretinal heme -- improved  - BCVA 20/25 OS (improved)  - OCT shows interval improvement in IRF overlying stable low-lying PED / [facility] temporal fovea and macula at 5 weeks  - recommend IVV OS #6 today, 12.21.23, w/ f/u in 5 wks  - pt wishes to proceed  - RBA of procedure discussed, questions answered  - informed consent obtained and signed  - see procedure note  - IVA consent form signed and scanned, 03.04.21  - IVE consent form re-signed and scanned, 04.20.23  - IVV consent form signed and scanned, 07.18.23  - f/u in 5 wks -- DFE/OCT, possible injection  2. Age related macular degeneration, non-exudative, OD  - The incidence, anatomy, and pathology of dry AMD, risk of progression, and the AREDS and AREDS 2 study including smoking risks discussed with patient.   - recommend Amsler grid monitoring  3. Pigmentary retinopathy OU  - peripheral pigmented CR scarring and perivascular pigment clumping / bone spicules OU  - VA remains fairly good (20/20 OD, 20/40 OS with exudative ARMD OS)  - denies night blindness  - denies family history of vision problems  - discussed findings  -  broad differential for pigmentary retinopathy:  Congenital (RP), infectious (TB, lyme dx, syphilis, toxo, bartonella),  autoimmune and toxic etiologies  - labs checked (4.2.21):  All essential WNL   Rheumatoid factor   CBC   Comprehensive metabolic panel     RPR   ANA   ESR, CRP   ACE   ANCA   Toxoplasma antibodies IgG / IgM   Quantiferon - TB Gold Plus   HLA, A, B, C (IR)   Fluorescent treponemal ab (fla) - IgG - bld   VDRL Serum   Bartonella antibody, 06.04.21  - [facility] -- drawn on 6.4.21                         Results Positive for AGBL5, ALMS1, OPA1 -- unknown significance              - f/u in 4-5 weeks  4,5. Hypertensive retinopathy OU  - discussed importance of tight BP control  - continue to monitor  6. Mixed form age related cataracts OU  - The symptoms of cataract, surgical options, and treatments and risks were discussed with patient.  - discussed diagnosis and progression  - not yet visually significant  - continue to monitor  Ophthalmic Meds Ordered this visit:  Meds ordered this encounter  Medications   faricimab-svoa (VABYSMO) 6mg /0.10mL intravitreal injection     Return in about 5 weeks (around 05/10/2022) for f/u exu ARMD OS, DFE, OCT.  There are no Patient Instructions on file for this visit.  This document serves as a record of services personally performed by Karie Chimera, MD, PhD. It was created on their behalf by Glee Arvin. Manson Passey, OA an ophthalmic technician. The creation of this record is the provider's dictation and/or activities during the visit.    Electronically signed by: Glee Arvin. Manson Passey, [place] 12.19.2023 1:15 PM  Karie Chimera, M.D., Ph.D. Diseases & Surgery of the Retina and Vitreous [facility]  I have reviewed the above documentation for accuracy and completeness, and I agree with the above. Karie Chimera, M.D., Ph.D. 04/05/22 1:16 PM   Abbreviations: M myopia (nearsighted); A astigmatism; H hyperopia (farsighted); P presbyopia; Mrx spectacle prescription;  CTL contact lenses; OD right eye; OS left eye; OU both  eyes  XT exotropia; ET esotropia; PEK punctate epithelial keratitis; PEE punctate epithelial erosions; DES dry eye syndrome; MGD meibomian gland dysfunction; ATs artificial tears; PFAT's preservative free artificial tears; NSC nuclear sclerotic cataract; PSC posterior subcapsular cataract; ERM epi-retinal membrane; PVD posterior vitreous detachment; RD retinal detachment; DM diabetes mellitus; DR diabetic retinopathy; NPDR non-proliferative diabetic retinopathy; PDR proliferative diabetic retinopathy; CSME clinically significant macular edema; DME diabetic macular edema; dbh dot blot hemorrhages; CWS cotton wool spot; POAG primary open angle glaucoma; C/D cup-to-disc ratio; HVF humphrey visual field; GVF goldmann visual field; OCT optical coherence tomography; IOP intraocular pressure; BRVO Branch retinal vein occlusion; CRVO central retinal vein occlusion; CRAO central retinal artery occlusion; BRAO branch retinal artery occlusion; RT retinal tear; SB scleral buckle; PPV pars plana vitrectomy; VH Vitreous hemorrhage; PRP panretinal laser photocoagulation; IVK intravitreal kenalog; VMT vitreomacular traction; MH Macular hole;  NVD neovascularization of the disc; NVE neovascularization elsewhere; AREDS age related eye disease study; ARMD age related macular degeneration; POAG primary open angle glaucoma; EBMD epithelial/anterior basement membrane dystrophy; ACIOL anterior chamber intraocular lens; IOL intraocular lens; PCIOL posterior chamber intraocular lens; Phaco/IOL phacoemulsification with intraocular lens placement; PRK photorefractive keratectomy; LASIK  laser assisted in situ keratomileusis; HTN hypertension; DM diabetes mellitus; COPD chronic obstructive pulmonary disease

## 2022-04-05 ENCOUNTER — Ambulatory Visit (INDEPENDENT_AMBULATORY_CARE_PROVIDER_SITE_OTHER): Payer: BC Managed Care – PPO | Admitting: Ophthalmology

## 2022-04-05 ENCOUNTER — Encounter (INDEPENDENT_AMBULATORY_CARE_PROVIDER_SITE_OTHER): Payer: Self-pay | Admitting: Ophthalmology

## 2022-04-05 DIAGNOSIS — H35033 Hypertensive retinopathy, bilateral: Secondary | ICD-10-CM | POA: Diagnosis not present

## 2022-04-05 DIAGNOSIS — H353112 Nonexudative age-related macular degeneration, right eye, intermediate dry stage: Secondary | ICD-10-CM

## 2022-04-05 DIAGNOSIS — H3552 Pigmentary retinal dystrophy: Secondary | ICD-10-CM

## 2022-04-05 DIAGNOSIS — H353221 Exudative age-related macular degeneration, left eye, with active choroidal neovascularization: Secondary | ICD-10-CM | POA: Diagnosis not present

## 2022-04-05 DIAGNOSIS — I10 Essential (primary) hypertension: Secondary | ICD-10-CM | POA: Diagnosis not present

## 2022-04-05 DIAGNOSIS — H25813 Combined forms of age-related cataract, bilateral: Secondary | ICD-10-CM

## 2022-04-05 MED ORDER — FARICIMAB-SVOA 6 MG/0.05ML IZ SOLN
6.0000 mg | INTRAVITREAL | Status: AC | PRN
  Administered 2022-04-05: 6 mg via INTRAVITREAL

## 2022-04-26 NOTE — Progress Notes (Signed)
[facility] Note  05/10/2022    CHIEF COMPLAINT Patient presents for Retina Follow Up  HISTORY OF PRESENT ILLNESS: Eric Key is a 67 y.o. male who presents to the clinic today for:   HPI     Retina Follow Up   Patient presents with  Dry AMD.  In left eye.  This started 5 weeks ago.  Duration of 5 weeks.  Since onset it is stable.  I, the attending physician,  performed the HPI with the patient and updated documentation appropriately.        Comments   5 week retina eval ARMD and IVV OS pt is reporting no vision changes noticed he denies any flashes or floaters       Last edited by [name], MD on 05/10/2022  8:24 AM.    Pt states no change in vision  Referring physician: [name], [name], MD [place],  [place] [address]  HISTORICAL INFORMATION:   Selected notes from the MEDICAL RECORD NUMBER Referred by Dr. [name] for concern of mac heme OS.   CURRENT MEDICATIONS: No current outpatient medications on file. (Ophthalmic Drugs)   No current facility-administered medications for this visit. (Ophthalmic Drugs)   Current Outpatient Medications (Other)  Medication Sig   buPROPion (ZYBAN) 150 MG 12 hr tablet Take 1 tablet (150 mg total) by mouth 2 (two) times daily.   meloxicam (MOBIC) 15 MG tablet Take 15 mg by mouth daily.   rosuvastatin (CRESTOR) 5 MG tablet Take 1 tablet (5 mg total) by mouth daily.   No current facility-administered medications for this visit. (Other)   REVIEW OF SYSTEMS: ROS   Positive for: Eyes Last edited by [name], MD on 05/10/2022  8:24 AM.     ALLERGIES Allergies  Allergen Reactions   Penicillins Hives   PAST MEDICAL HISTORY Past Medical History:  Diagnosis Date   Cataract    Mixed form OU   History of adenomatous polyps of colon 03/16/2020   Hypertension    past hx    Hypertensive retinopathy    OU   Macular degeneration    Dry OD; Wet OS   Past Surgical History:   Procedure Laterality Date   ACHILLES TENDON REPAIR  2010   CHOLECYSTECTOMY     COLONOSCOPY     GALLBLADDER SURGERY     FAMILY HISTORY Family History  Problem Relation Age of Onset   Diabetes Brother    Heart disease Brother    Diabetes Maternal Grandfather    Colon polyps Neg Hx    Colon cancer Neg Hx    Esophageal cancer Neg Hx    Rectal cancer Neg Hx    Stomach cancer Neg Hx    SOCIAL HISTORY Social History   Tobacco Use   Smoking status: Some Days    Types: Cigarettes, Cigars    Last attempt to quit: 02/05/2020    Years since quitting: 2.2   Smokeless tobacco: Never  Vaping Use   Vaping Use: Never used  Substance Use Topics   Alcohol use: Yes    Comment: occ   Drug use: Never       OPHTHALMIC EXAM: Base Eye Exam     Visual Acuity (Snellen - Linear)       Right Left   Dist cc 20/25 20/30 -1         Tonometry (Tonopen, 7:46 AM)       Right Left  Pressure 14 7         Pupils       Pupils Dark Light Shape React APD   Right PERRL 3 2 Round Brisk None   Left PERRL 3 2 Round Brisk None         Visual Fields       Left Right    Full Full         Extraocular Movement       Right Left    Full, Ortho Full, Ortho         Neuro/Psych     Oriented x3: Yes   Mood/Affect: Normal         Dilation     Both eyes: 2.5% Phenylephrine @ 7:46 AM           Slit Lamp and Fundus Exam     Slit Lamp Exam       Right Left   Lids/Lashes Dermatochalasis - upper lid Dermatochalasis - upper lid   Conjunctiva/Sclera White and quiet White and quiet   Cornea 1-2+ PEE, mild Arcus, trace tear film debris mild Arcus, 2+ inferior punctate epithelial erosions centrally, mild tear film debris, 2.10m epi defect at 0230 limbus -- closed   Anterior Chamber deep, clear, narrow angles deep, clear, narrow angles   Iris Round and moderately dilated, mild anterior bowing Round and moderately dilated, mild anterior bowing   Lens 2-3+ Nuclear sclerosis,  2-3+ Cortical cataract 2-3+ Nuclear sclerosis, 2-3+ Cortical cataract   Anterior Vitreous Vitreous syneresis Vitreous syneresis         Fundus Exam       Right Left   Disc Pink and Sharp, focal PPP ST, Compact Pink and Sharp, Compact   C/D Ratio 0.2 0.4   Macula Flat, Drusen, RPE mottling and clumping, early Atrophy, punctate IRH - improved Abnormal foveal reflex, +CNV with heme improved, +central edema / cystic changes -- improved, Drusen, RPE mottling and clumping, early subretinal fibrosis, punctate IRH - improved   Vessels attenuated, mild tortuosity, perivascular pigment clumping peripherally attenuated, mild tortuosity, perivascular pigment clumping peripherally   Periphery Attached, peripheral pigmented CR scarring and perivascular bone spicules 360 Attached, peripheral pigmented CR scarring and perivascular bone spicules 360           Refraction     Wearing Rx       Sphere Cylinder Axis Add   Right [place] +1.25 177 +2.25   Left [place] +0.75 170 +2.25           IMAGING AND PROCEDURES  Imaging and Procedures for _0 @  OCT, Retina - OU - Both Eyes       Right Eye Quality was good. Central Foveal Thickness: 292. Progression has been stable. Findings include normal foveal contour, no IRF, no SRF, retinal drusen , pigment epithelial detachment, outer retinal atrophy (Persistent vitreous opacities; stable low lying PEDs and ORA, partial PVD).   Left Eye Quality was good. Central Foveal Thickness: 301. Progression has improved. Findings include abnormal foveal contour, retinal drusen , subretinal hyper-reflective material, intraretinal fluid, pigment epithelial detachment, outer retinal atrophy (Mild interval improvement in IRF overlying stable low-lying PED / [facility] and [facility]ula).   Notes *Images captured and stored on drive  Diagnosis / Impression:  OD: non-exu ARMD w/ stable low lying PEDs and ORA OS: exu ARMD -- interval improvement in IRF overlying  stable low-lying PED / [facility] fovea and [facility]ula  Clinical management:  See below  Abbreviations: NFP -  Normal foveal profile. CME - cystoid macular edema. PED - pigment epithelial detachment. IRF - intraretinal fluid. SRF - subretinal fluid. EZ - ellipsoid zone. ERM - epiretinal membrane. ORA - outer retinal atrophy. ORT - outer retinal tubulation. SRHM - subretinal hyper-reflective material      Intravitreal Injection, Pharmacologic Agent - OS - Left Eye       Time Out 05/10/2022. 7:54 AM. Confirmed correct patient, procedure, site, and patient consented.   Anesthesia Topical anesthesia was used. Anesthetic medications included Lidocaine 2%, Proparacaine 0.5%.   Procedure Preparation included 5% betadine to ocular surface, eyelid speculum. A (32g) needle was used.   Injection: 6 mg faricimab-svoa 6 MG/0.05ML   Route: Intravitreal, Site: Left Eye   NDC: [id number], Lot: [id number], Expiration date: 05/15/2024, Waste: 0 mL   Post-op Post injection exam found visual acuity of at least counting fingers. The patient tolerated the procedure well. There were no complications. The patient received written and verbal post procedure care education. Post injection medications were not given.             ASSESSMENT/PLAN:   ICD-10-CM   1. Exudative age-related macular degeneration of left eye with active choroidal neovascularization (HCC)  H35.3221 OCT, Retina - OU - Both Eyes    Intravitreal Injection, Pharmacologic Agent - OS - Left Eye    faricimab-svoa (VABYSMO) 55m/0.05mL intravitreal injection    2. 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 Essential hypertension  I10     5. Hypertensive retinopathy of both eyes  H35.033     6. Combined forms of age-related cataract of both eyes  H25.813      1. 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, OS  - s/p IVA OS #1 (03.04.21), #2  (03.04.21), #3 (04.30.21) -- IVA resistance  - s/p IVE OS #1 (06.04.21), #2 (07.08.21), #3 (08.09.21), #4 (09.13.21), #5 (10.18.21), #6 (11.24.21), #7 (12.31.21), #8 (02.11.22), #9 (03.25.22), #10 (05.02.22), #11 (06.06.22), #12 (07.13.22), #13 (08.17.22), #14 (09.21.22), #15 (10.26.22), #16 (11.29.22), #17 (01.03.23), #18 (02.07.23), #19 (03.16.23), #20 (04.20.23), #21 (05.19.23), #22 (06.20.23) -- IVE resistance  - s/p IVV OS #1 (07/18.23), #2 (08.15.23), #3 (09.12.23), #4 (10.10.23), #5 (11.14.23), #6 (12.21.23)  - pt reports stable vision OS  - exam shows central CNV with +subretinal heme -- improved  - BCVA 20/30 OS (decreased)  - OCT shows interval improvement in IRF overlying stable low-lying PED / [facility] fovea and macula at 5 weeks  - recommend IVV OS #7 today, 01.25.24, w/ f/u in 5-6 wks  - pt wishes to proceed  - RBA of procedure discussed, questions answered  - informed consent obtained and signed  - see procedure note  - IVA consent form signed and scanned, 03.04.21  - IVE consent form re-signed and scanned, 04.20.23  - IVV consent form signed and scanned, 07.18.23  - f/u in 5-6 wks -- DFE/OCT, possible injection  2. Age related macular degeneration, non-exudative, OD  - The incidence, anatomy, and pathology of dry AMD, risk of progression, and the AREDS and AREDS 2 study including smoking risks discussed with patient.   - recommend Amsler grid monitoring  3. Pigmentary retinopathy OU  - peripheral pigmented CR scarring and perivascular pigment clumping / bone spicules OU  - VA remains fairly good (20/20 OD, 20/40 OS with exudative ARMD OS)  - denies night blindness  - denies family history of vision problems  -  discussed findings  - broad differential for pigmentary retinopathy:  Congenital (RP), infectious (TB, lyme dx, syphilis, toxo, bartonella), autoimmune and toxic etiologies  - labs checked (4.2.21):  All essential WNL   Rheumatoid factor   CBC   Comprehensive  metabolic panel     RPR   ANA   ESR, CRP   ACE   ANCA   Toxoplasma antibodies IgG / IgM   Quantiferon - TB Gold Plus   HLA, A, B, C (IR)   Fluorescent treponemal ab (fla) - IgG - bld   VDRL Serum   Bartonella antibody, 06.04.21  - [facility] -- drawn on 6.4.21                         Results Positive for AGBL5, ALMS1, OPA1 -- unknown significance              - monitor  4,5. Hypertensive retinopathy OU  - discussed importance of tight BP control  - continue to monitor  6. Mixed form age related cataracts OU  - The symptoms of cataract, surgical options, and treatments and risks were discussed with patient.  - discussed diagnosis and progression  - not yet visually significant  - continue to monitor  Ophthalmic Meds Ordered this visit:  Meds ordered this encounter  Medications   faricimab-svoa (VABYSMO) 73m/0.05mL intravitreal injection     Return for f/u 5-6 weeks, exu ARMD OS, DFE, OCT.  There are no Patient Instructions on file for this visit.  This document serves as a record of services personally performed by BGardiner Sleeper MD, PhD. It was created on their behalf by ASan Jetty BOwens Shark OA an ophthalmic technician. The creation of this record is the provider's dictation and/or activities during the visit.    Electronically signed by: ASan Jetty BOwens Shark O[place]01.11.2024 2:00 AM  BGardiner Sleeper M.D., Ph.D. Diseases & Surgery of the Retina and Vitreous [facility] I have reviewed the above documentation for accuracy and completeness, and I agree with the above. BGardiner Sleeper M.D., Ph.D. 05/11/22 2:02 AM  Abbreviations: M myopia (nearsighted); A astigmatism; H hyperopia (farsighted); P presbyopia; Mrx spectacle prescription;  CTL contact lenses; OD right eye; OS left eye; OU both eyes  XT exotropia; ET esotropia; PEK punctate epithelial keratitis; PEE punctate epithelial erosions; DES dry eye syndrome; MGD meibomian gland dysfunction;  ATs artificial tears; PFAT's preservative free artificial tears; NPackwoodnuclear sclerotic cataract; PSC posterior subcapsular cataract; ERM epi-retinal membrane; PVD posterior vitreous detachment; RD retinal detachment; DM diabetes mellitus; DR diabetic retinopathy; NPDR non-proliferative diabetic retinopathy; PDR proliferative diabetic retinopathy; CSME clinically significant macular edema; DME diabetic macular edema; dbh dot blot hemorrhages; CWS cotton wool spot; POAG primary open angle glaucoma; C/D cup-to-disc ratio; HVF humphrey visual field; GVF goldmann visual field; OCT optical coherence tomography; IOP intraocular pressure; BRVO Branch retinal vein occlusion; CRVO central retinal vein occlusion; CRAO central retinal artery occlusion; BRAO branch retinal artery occlusion; RT retinal tear; SB scleral buckle; PPV pars plana vitrectomy; VH Vitreous hemorrhage; PRP panretinal laser photocoagulation; IVK intravitreal kenalog; VMT vitreomacular traction; MH Macular hole;  NVD neovascularization of the disc; NVE neovascularization elsewhere; AREDS age related eye disease study; ARMD age related macular degeneration; POAG primary open angle glaucoma; EBMD epithelial/anterior basement membrane dystrophy; ACIOL anterior chamber intraocular lens; IOL intraocular lens; PCIOL posterior chamber intraocular lens; Phaco/IOL phacoemulsification with intraocular lens placement; PRK photorefractive keratectomy; LASIK laser assisted in situ  keratomileusis; HTN hypertension; DM diabetes mellitus; COPD chronic obstructive pulmonary disease

## 2022-05-10 ENCOUNTER — Encounter (INDEPENDENT_AMBULATORY_CARE_PROVIDER_SITE_OTHER): Payer: Self-pay | Admitting: Ophthalmology

## 2022-05-10 ENCOUNTER — Ambulatory Visit (INDEPENDENT_AMBULATORY_CARE_PROVIDER_SITE_OTHER): Payer: Medicare PPO | Admitting: Ophthalmology

## 2022-05-10 DIAGNOSIS — H353221 Exudative age-related macular degeneration, left eye, with active choroidal neovascularization: Secondary | ICD-10-CM

## 2022-05-10 DIAGNOSIS — H353112 Nonexudative age-related macular degeneration, right eye, intermediate dry stage: Secondary | ICD-10-CM

## 2022-05-10 DIAGNOSIS — H3552 Pigmentary retinal dystrophy: Secondary | ICD-10-CM | POA: Diagnosis not present

## 2022-05-10 DIAGNOSIS — H25813 Combined forms of age-related cataract, bilateral: Secondary | ICD-10-CM

## 2022-05-10 DIAGNOSIS — I10 Essential (primary) hypertension: Secondary | ICD-10-CM | POA: Diagnosis not present

## 2022-05-10 DIAGNOSIS — H35033 Hypertensive retinopathy, bilateral: Secondary | ICD-10-CM | POA: Diagnosis not present

## 2022-05-10 MED ORDER — FARICIMAB-SVOA 6 MG/0.05ML IZ SOLN
6.0000 mg | INTRAVITREAL | Status: AC | PRN
  Administered 2022-05-10: 6 mg via INTRAVITREAL

## 2022-05-31 NOTE — Progress Notes (Addendum)
[facility] Note  06/14/2022    CHIEF COMPLAINT Patient presents for Retina Follow Up  HISTORY OF PRESENT ILLNESS: Eric Key is a 67 y.o. male who presents to the clinic today for:   HPI     Retina Follow Up   Patient presents with  Dry AMD.  In left eye.  This started years ago.  Duration of 5 weeks.  Since onset it is stable.  I, the attending physician,  performed the HPI with the patient and updated documentation appropriately.        Comments   Patient denies noticing any vision changes at this time. He is not using any eye drops at this time.       Last edited by [name], MD on 06/14/2022  8:30 AM.    Pt states there is no change in his vision  Referring physician: [name], [name], MD [place],  [place] [id number]  HISTORICAL INFORMATION:   Selected notes from the MEDICAL RECORD NUMBER Referred by Dr. [name] for concern of mac heme OS.   CURRENT MEDICATIONS: No current outpatient medications on file. (Ophthalmic Drugs)   No current facility-administered medications for this visit. (Ophthalmic Drugs)   Current Outpatient Medications (Other)  Medication Sig   buPROPion (ZYBAN) 150 MG 12 hr tablet Take 1 tablet (150 mg total) by mouth 2 (two) times daily.   meloxicam (MOBIC) 15 MG tablet Take 15 mg by mouth daily.   rosuvastatin (CRESTOR) 5 MG tablet Take 1 tablet (5 mg total) by mouth daily.   No current facility-administered medications for this visit. (Other)   REVIEW OF SYSTEMS: ROS   Positive for: Eyes Negative for: Constitutional, Gastrointestinal, Neurological, Skin, Genitourinary, Musculoskeletal, HENT, Endocrine, Cardiovascular, Respiratory, Psychiatric, Allergic/Imm, Heme/Lymph Last edited by Annie Paras, COT on 06/14/2022  7:48 AM.     ALLERGIES Allergies  Allergen Reactions   Penicillins Hives   PAST MEDICAL HISTORY Past Medical History:  Diagnosis Date   Cataract     Mixed form OU   History of adenomatous polyps of colon 03/16/2020   Hypertension    past hx    Hypertensive retinopathy    OU   Macular degeneration    Dry OD; Wet OS   Past Surgical History:  Procedure Laterality Date   ACHILLES TENDON REPAIR  2010   CHOLECYSTECTOMY     COLONOSCOPY     GALLBLADDER SURGERY     FAMILY HISTORY Family History  Problem Relation Age of Onset   Diabetes Brother    Heart disease Brother    Diabetes Maternal Grandfather    Colon polyps Neg Hx    Colon cancer Neg Hx    Esophageal cancer Neg Hx    Rectal cancer Neg Hx    Stomach cancer Neg Hx    SOCIAL HISTORY Social History   Tobacco Use   Smoking status: Some Days    Types: Cigarettes, Cigars    Last attempt to quit: 02/05/2020    Years since quitting: 2.3   Smokeless tobacco: Never  Vaping Use   Vaping Use: Never used  Substance Use Topics   Alcohol use: Yes    Comment: occ   Drug use: Never       OPHTHALMIC EXAM: Base Eye Exam     Visual Acuity (Snellen - Linear)       Right Left   Dist cc 20/25 -2 20/30   Dist ph cc  NI    Correction: Glasses         Tonometry (Tonopen, 7:51 AM)       Right Left   Pressure 11 14         Pupils       Dark Light Shape React APD   Right 3 2 Round Brisk None   Left 3 2 Round Brisk None         Visual Fields       Left Right    Full Full         Extraocular Movement       Right Left    Full, Ortho Full, Ortho         Neuro/Psych     Oriented x3: Yes   Mood/Affect: Normal         Dilation     Both eyes: 1.0% Mydriacyl, 2.5% Phenylephrine @ 7:49 AM           Slit Lamp and Fundus Exam     Slit Lamp Exam       Right Left   Lids/Lashes Dermatochalasis - upper lid Dermatochalasis - upper lid   Conjunctiva/Sclera White and quiet White and quiet   Cornea 1-2+ PEE, mild Arcus, trace tear film debris mild Arcus, 2+ inferior punctate epithelial erosions centrally, mild tear film debris, 2.18m epi defect at  0230 limbus -- closed   Anterior Chamber deep, clear, narrow angles deep, clear, narrow angles   Iris Round and moderately dilated, mild anterior bowing Round and moderately dilated, mild anterior bowing   Lens 2-3+ Nuclear sclerosis, 2-3+ Cortical cataract 2-3+ Nuclear sclerosis, 2-3+ Cortical cataract   Anterior Vitreous Vitreous syneresis Vitreous syneresis         Fundus Exam       Right Left   Disc Pink and Sharp, focal PPP ST, Compact Pink and Sharp, Compact   C/D Ratio 0.2 0.4   Macula Flat, Drusen, RPE mottling and clumping, early Atrophy, punctate IRH - improved Abnormal foveal reflex, +CNV with heme improved, +central edema / cystic changes -- improved, Drusen, RPE mottling and clumping, early subretinal fibrosis, punctate IRH - improved   Vessels attenuated, tortuous, perivascular pigment clumping peripherally attenuated, tortuous, perivascular pigment clumping peripherally   Periphery Attached, peripheral pigmented CR scarring and perivascular bone spicules 360 Attached, peripheral pigmented CR scarring and perivascular bone spicules 360           Refraction     Wearing Rx       Sphere Cylinder Axis Add   Right [place] +1.25 177 +2.25   Left [place] +0.75 170 +2.25           IMAGING AND PROCEDURES  Imaging and Procedures for @TODAY$ @  OCT, Retina - OU - Both Eyes       Right Eye Quality was good. Central Foveal Thickness: 295. Progression has been stable. Findings include normal foveal contour, no IRF, no SRF, retinal drusen , pigment epithelial detachment, outer retinal atrophy (Persistent vitreous opacities; stable low lying PEDs and ORA, partial PVD).   Left Eye Quality was good. Central Foveal Thickness: 296. Progression has improved. Findings include abnormal foveal contour, retinal drusen , subretinal hyper-reflective material, intraretinal fluid, pigment epithelial detachment, outer retinal atrophy (Mild interval improvement in IRF overlying stable  low-lying PED / [facility]ula, just trace cystic changes remain).   Notes *Images captured and stored on drive  Diagnosis / Impression:  OD: non-exu ARMD w/ stable low lying PEDs  and ORA OS: exu ARMD -- Mild interval improvement in IRF overlying stable low-lying PED / SRHM temporal fovea and macula, just trace cystic changes remain  Clinical management:  See below  Abbreviations: NFP - Normal foveal profile. CME - cystoid macular edema. PED - pigment epithelial detachment. IRF - intraretinal fluid. SRF - subretinal fluid. EZ - ellipsoid zone. ERM - epiretinal membrane. ORA - outer retinal atrophy. ORT - outer retinal tubulation. SRHM - subretinal hyper-reflective material      Intravitreal Injection, Pharmacologic Agent - OS - Left Eye       Time Out 06/14/2022. 7:57 AM. Confirmed correct patient, procedure, site, and patient consented.   Anesthesia Topical anesthesia was used. Anesthetic medications included Lidocaine 2%, Proparacaine 0.5%.   Procedure Preparation included 5% betadine to ocular surface, eyelid speculum. A (32g) needle was used.   Injection: 6 mg faricimab-svoa 6 MG/0.05ML   Route: Intravitreal, Site: Left Eye   NDC: [id number], Lot: [id number], Expiration date: 05/16/2024, Waste: 0 mL   Post-op Post injection exam found visual acuity of at least counting fingers. The patient tolerated the procedure well. There were no complications. The patient received written and verbal post procedure care education. Post injection medications were not given.            ASSESSMENT/PLAN:   ICD-10-CM   1. Exudative age-related macular degeneration of left eye with active choroidal neovascularization (HCC)  H35.3221 OCT, Retina - OU - Both Eyes    Intravitreal Injection, Pharmacologic Agent - OS - Left Eye    faricimab-svoa (VABYSMO) 29m/0.05mL intravitreal injection    2. 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 Essential hypertension  I10     5. Hypertensive retinopathy of both eyes  H35.033     6. Combined forms of age-related cataract of both eyes  H25.813      1. 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, OS  - s/p IVA OS #1 (03.04.21), #2 (03.04.21), #3 (04.30.21) -- IVA resistance  - s/p IVE OS #1 (06.04.21), #2 (07.08.21), #3 (08.09.21), #4 (09.13.21), #5 (10.18.21), #6 (11.24.21), #7 (12.31.21), #8 (02.11.22), #9 (03.25.22), #10 (05.02.22), #11 (06.06.22), #12 (07.13.22), #13 (08.17.22), #14 (09.21.22), #15 (10.26.22), #16 (11.29.22), #17 (01.03.23), #18 (02.07.23), #19 (03.16.23), #20 (04.20.23), #21 (05.19.23), #22 (06.20.23) -- IVE resistance  - s/p IVV OS #1 (07/18.23), #2 (08.15.23), #3 (09.12.23), #4 (10.10.23), #5 (11.14.23), #6 (12.21.23), #7 (01.25.24)  - pt reports stable vision OS  - exam shows central CNV with +subretinal heme -- improved  - BCVA 20/30 OS (stable)  - OCT shows interval improvement in IRF overlying [facility] low[facility] at 5 weeks  - recommend IVV OS #8 today, 02.29.24, w/ f/u in 6 wks  - pt wishes to proceed  - RBA of procedure discussed, questions answered  - informed consent obtained and signed  - see procedure note  - IVA consent form signed and scanned, 03.04.21  - IVE consent form re-signed and scanned, 04.20.23  - IVV consent form signed and scanned, 07.18.23  - f/u in 6 wks -- DFE/OCT, possible injection  2. Age related macular degeneration, non-exudative, OD  - The incidence, anatomy, and pathology of dry AMD, risk of progression, and the AREDS and AREDS 2 study including smoking risks discussed with patient.   - recommend Amsler grid monitoring  3. Pigmentary retinopathy OU  - peripheral pigmented CR  scarring and perivascular pigment clumping / bone spicules OU  - VA remains fairly good (20/20 OD, 20/40 OS with exudative ARMD OS)  - denies night  blindness  - denies family history of vision problems  - discussed findings  - broad differential for pigmentary retinopathy:  Congenital (RP), infectious (TB, lyme dx, syphilis, toxo, bartonella), autoimmune and toxic etiologies  - labs checked (4.2.21):  All essential WNL   Rheumatoid factor   CBC   Comprehensive metabolic panel     RPR   ANA   ESR, CRP   ACE   ANCA   Toxoplasma antibodies IgG / IgM   Quantiferon - TB Gold Plus   HLA, A, B, C (IR)   Fluorescent treponemal ab (fla) - IgG - bld   VDRL Serum   Bartonella antibody, 06.04.21  - [facility] -- drawn on 6.4.21                         Results Positive for AGBL5, ALMS1, OPA1 -- unknown significance              - monitor  4,5. Hypertensive retinopathy OU  - discussed importance of tight BP control  - continue to monitor  6. Mixed form age related cataracts OU  - The symptoms of cataract, surgical options, and treatments and risks were discussed with patient.  - discussed diagnosis and progression  - not yet visually significant  - continue to monitor  Ophthalmic Meds Ordered this visit:  Meds ordered this encounter  Medications   faricimab-svoa (VABYSMO) 53m/0.05mL intravitreal injection     Return in 6 weeks (on 07/26/2022) for f/u 6 weeks, exu ARMD OS, DFE, OCT, possible injxn.  There are no Patient Instructions on file for this visit.  This document serves as a record of services personally performed by BGardiner Sleeper MD, PhD. It was created on their behalf by ASan Jetty BOwens Shark OA an ophthalmic technician. The creation of this record is the provider's dictation and/or activities during the visit.    Electronically signed by: ASan Jetty BOwens Shark O[place]02.15.2024 9:08 AM   BGardiner Sleeper M.D., Ph.D. Diseases & Surgery of the Retina and Vitreous [facility] I have reviewed the above documentation for accuracy and completeness, and I agree with the above. BGardiner Sleeper  M.D., Ph.D. 06/14/22 9:08 AM   Abbreviations: M myopia (nearsighted); A astigmatism; H hyperopia (farsighted); P presbyopia; Mrx spectacle prescription;  CTL contact lenses; OD right eye; OS left eye; OU both eyes  XT exotropia; ET esotropia; PEK punctate epithelial keratitis; PEE punctate epithelial erosions; DES dry eye syndrome; MGD meibomian gland dysfunction; ATs artificial tears; PFAT's preservative free artificial tears; NBryantnuclear sclerotic cataract; PSC posterior subcapsular cataract; ERM epi-retinal membrane; PVD posterior vitreous detachment; RD retinal detachment; DM diabetes mellitus; DR diabetic retinopathy; NPDR non-proliferative diabetic retinopathy; PDR proliferative diabetic retinopathy; CSME clinically significant macular edema; DME diabetic macular edema; dbh dot blot hemorrhages; CWS cotton wool spot; POAG primary open angle glaucoma; C/D cup-to-disc ratio; HVF humphrey visual field; GVF goldmann visual field; OCT optical coherence tomography; IOP intraocular pressure; BRVO Branch retinal vein occlusion; CRVO central retinal vein occlusion; CRAO central retinal artery occlusion; BRAO branch retinal artery occlusion; RT retinal tear; SB scleral buckle; PPV pars plana vitrectomy; VH Vitreous hemorrhage; PRP panretinal laser photocoagulation; IVK intravitreal kenalog; VMT vitreomacular traction; MH Macular hole;  NVD neovascularization of the disc; NVE neovascularization elsewhere; AREDS  age related eye disease study; ARMD age related macular degeneration; POAG primary open angle glaucoma; EBMD epithelial/anterior basement membrane dystrophy; ACIOL anterior chamber intraocular lens; IOL intraocular lens; PCIOL posterior chamber intraocular lens; Phaco/IOL phacoemulsification with intraocular lens placement; [place] photorefractive keratectomy; LASIK laser assisted in situ keratomileusis; HTN hypertension; DM diabetes mellitus; COPD chronic obstructive pulmonary disease

## 2022-06-14 ENCOUNTER — Ambulatory Visit (INDEPENDENT_AMBULATORY_CARE_PROVIDER_SITE_OTHER): Payer: Medicare PPO | Admitting: Ophthalmology

## 2022-06-14 ENCOUNTER — Encounter (INDEPENDENT_AMBULATORY_CARE_PROVIDER_SITE_OTHER): Payer: Self-pay | Admitting: Ophthalmology

## 2022-06-14 DIAGNOSIS — H35033 Hypertensive retinopathy, bilateral: Secondary | ICD-10-CM | POA: Diagnosis not present

## 2022-06-14 DIAGNOSIS — H353112 Nonexudative age-related macular degeneration, right eye, intermediate dry stage: Secondary | ICD-10-CM | POA: Diagnosis not present

## 2022-06-14 DIAGNOSIS — H1830 Unspecified corneal membrane change: Secondary | ICD-10-CM

## 2022-06-14 DIAGNOSIS — H25813 Combined forms of age-related cataract, bilateral: Secondary | ICD-10-CM

## 2022-06-14 DIAGNOSIS — I10 Essential (primary) hypertension: Secondary | ICD-10-CM

## 2022-06-14 DIAGNOSIS — H353221 Exudative age-related macular degeneration, left eye, with active choroidal neovascularization: Secondary | ICD-10-CM

## 2022-06-14 DIAGNOSIS — H3552 Pigmentary retinal dystrophy: Secondary | ICD-10-CM | POA: Diagnosis not present

## 2022-06-14 MED ORDER — FARICIMAB-SVOA 6 MG/0.05ML IZ SOLN
6.0000 mg | INTRAVITREAL | Status: AC | PRN
  Administered 2022-06-14: 6 mg via INTRAVITREAL

## 2022-07-09 ENCOUNTER — Other Ambulatory Visit (INDEPENDENT_AMBULATORY_CARE_PROVIDER_SITE_OTHER): Payer: Self-pay

## 2022-07-09 DIAGNOSIS — H25813 Combined forms of age-related cataract, bilateral: Secondary | ICD-10-CM

## 2022-07-09 DIAGNOSIS — I10 Essential (primary) hypertension: Secondary | ICD-10-CM

## 2022-07-09 DIAGNOSIS — H35033 Hypertensive retinopathy, bilateral: Secondary | ICD-10-CM

## 2022-07-09 DIAGNOSIS — H1830 Unspecified corneal membrane change: Secondary | ICD-10-CM

## 2022-07-09 DIAGNOSIS — H353221 Exudative age-related macular degeneration, left eye, with active choroidal neovascularization: Secondary | ICD-10-CM

## 2022-07-09 DIAGNOSIS — H3552 Pigmentary retinal dystrophy: Secondary | ICD-10-CM

## 2022-07-09 DIAGNOSIS — H353112 Nonexudative age-related macular degeneration, right eye, intermediate dry stage: Secondary | ICD-10-CM

## 2022-07-09 NOTE — Progress Notes (Signed)
[facility] Note  07/09/2022    CHIEF COMPLAINT Patient presents for No chief complaint on file.  HISTORY OF PRESENT ILLNESS: Eric Key is a 67 y.o. male who presents to the clinic today for:    Pt states there is no change in his vision  Referring physician: No referring provider defined for this encounter.  HISTORICAL INFORMATION:   Selected notes from the MEDICAL RECORD NUMBER Referred by Dr. [name] for concern of mac heme OS.   CURRENT MEDICATIONS: No current outpatient medications on file. (Ophthalmic Drugs)   No current facility-administered medications for this visit. (Ophthalmic Drugs)   Current Outpatient Medications (Other)  Medication Sig   buPROPion (ZYBAN) 150 MG 12 hr tablet Take 1 tablet (150 mg total) by mouth 2 (two) times daily.   meloxicam (MOBIC) 15 MG tablet Take 15 mg by mouth daily.   rosuvastatin (CRESTOR) 5 MG tablet Take 1 tablet (5 mg total) by mouth daily.   No current facility-administered medications for this visit. (Other)   REVIEW OF SYSTEMS:   ALLERGIES Allergies  Allergen Reactions   Penicillins Hives   PAST MEDICAL HISTORY Past Medical History:  Diagnosis Date   Cataract    Mixed form OU   History of adenomatous polyps of colon 03/16/2020   Hypertension    past hx    Hypertensive retinopathy    OU   Macular degeneration    Dry OD; Wet OS   Past Surgical History:  Procedure Laterality Date   ACHILLES TENDON REPAIR  2010   CHOLECYSTECTOMY     COLONOSCOPY     GALLBLADDER SURGERY     FAMILY HISTORY Family History  Problem Relation Age of Onset   Diabetes Brother    Heart disease Brother    Diabetes Maternal Grandfather    Colon polyps Neg Hx    Colon cancer Neg Hx    Esophageal cancer Neg Hx    Rectal cancer Neg Hx    Stomach cancer Neg Hx    SOCIAL HISTORY Social History   Tobacco Use   Smoking status: Some Days    Types: Cigarettes, Cigars    Last attempt to quit:  02/05/2020    Years since quitting: 2.4   Smokeless tobacco: Never  Vaping Use   Vaping Use: Never used  Substance Use Topics   Alcohol use: Yes    Comment: occ   Drug use: Never       OPHTHALMIC EXAM: Not recorded    IMAGING AND PROCEDURES  Imaging and Procedures for @TODAY @          ASSESSMENT/PLAN:   ICD-10-CM   1. Exudative age-related macular degeneration of left eye with active choroidal neovascularization ([place])  H35.3221     2. 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 Essential hypertension  I10     5. Hypertensive retinopathy of both eyes  H35.033     6. 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, OS  - s/p IVA OS #1 (03.04.21), #2 (03.04.21), #3 (04.30.21) -- IVA resistance  - s/p IVE OS #1 (06.04.21), #2 (07.08.21), #3 (08.09.21), #4 (09.13.21), #5 (10.18.21), #6 (11.24.21), #7 (12.31.21), #8 (02.11.22), #9 (03.25.22), #10 (05.02.22), #11 (06.06.22), #12 (07.13.22), #13 (  08.17.22), #14 (09.21.22), #15 (10.26.22), #16 (11.29.22), #17 (01.03.23), #18 (02.07.23), #19 (03.16.23), #20 (04.20.23), #21 (05.19.23), #22 (06.20.23) -- IVE resistance  - s/p IVV OS #1 (07/18.23), #2 (08.15.23), #3 (09.12.23), #4 (10.10.23), #5 (11.14.23), #6 (12.21.23), #7 (01.25.24), #8 (02.29.24)  - pt reports stable vision OS  - exam shows central CNV with +subretinal heme -- improved  - BCVA 20/30 OS (stable)  - OCT shows interval improvement in IRF overlying stable low-lying PED / [facility] temporal fovea and macula at 5 weeks  - recommend IVV OS #9 today, 04.11.24, w/ f/u in 6 wks  - pt wishes to proceed  - RBA of procedure discussed, questions answered  - informed consent obtained and signed  - see procedure note  - IVA consent form signed and scanned, 03.04.21  - IVE consent form  re-signed and scanned, 04.20.23  - IVV consent form signed and scanned, 07.18.23  - f/u in 6 wks -- DFE/OCT, possible injection  2. Age related macular degeneration, non-exudative, OD  - The incidence, anatomy, and pathology of dry AMD, risk of progression, and the AREDS and AREDS 2 study including smoking risks discussed with patient.   - recommend Amsler grid monitoring  3. Pigmentary retinopathy OU  - peripheral pigmented CR scarring and perivascular pigment clumping / bone spicules OU  - VA remains fairly good (20/20 OD, 20/40 OS with exudative ARMD OS)  - denies night blindness  - denies family history of vision problems  - discussed findings  - broad differential for pigmentary retinopathy:  Congenital (RP), infectious (TB, lyme dx, syphilis, toxo, bartonella), autoimmune and toxic etiologies  - labs checked (4.2.21):  All essential WNL   Rheumatoid factor   CBC   Comprehensive metabolic panel     RPR   ANA   ESR, CRP   ACE   ANCA   Toxoplasma antibodies IgG / IgM   Quantiferon - TB Gold Plus   HLA, A, B, C (IR)   Fluorescent treponemal ab (fla) - IgG - bld   VDRL Serum   Bartonella antibody, 06.04.21  - [facility] -- drawn on 6.4.21                         Results Positive for AGBL5, ALMS1, OPA1 -- unknown significance              - monitor  4,5. Hypertensive retinopathy OU  - discussed importance of tight BP control  - continue to monitor  6. Mixed form age related cataracts OU  - The symptoms of cataract, surgical options, and treatments and risks were discussed with patient.  - discussed diagnosis and progression  - not yet visually significant  - continue to monitor  Ophthalmic Meds Ordered this visit:  No orders of the defined types were placed in this encounter.    No follow-ups on file.  There are no Patient Instructions on file for this visit.  This document serves as a record of services personally performed by Gardiner Sleeper, MD, PhD.  It was created on their behalf by San Jetty. Owens Shark, OA an ophthalmic technician. The creation of this record is the provider's dictation and/or activities during the visit.    Electronically signed by: San Jetty. Owens Shark, [place] 03.25.2024 12:23 PM    Gardiner Sleeper, M.D., Ph.D. Diseases & Surgery of the Retina and Vitreous [facility]: M myopia (nearsighted); A astigmatism; H  hyperopia (farsighted); P presbyopia; Mrx spectacle prescription;  CTL contact lenses; OD right eye; OS left eye; OU both eyes  XT exotropia; ET esotropia; PEK punctate epithelial keratitis; PEE punctate epithelial erosions; DES dry eye syndrome; MGD meibomian gland dysfunction; ATs artificial tears; PFAT's preservative free artificial tears; [place] nuclear sclerotic cataract; PSC posterior subcapsular cataract; ERM epi-retinal membrane; PVD posterior vitreous detachment; RD retinal detachment; DM diabetes mellitus; DR diabetic retinopathy; NPDR non-proliferative diabetic retinopathy; PDR proliferative diabetic retinopathy; CSME clinically significant macular edema; DME diabetic macular edema; dbh dot blot hemorrhages; CWS cotton wool spot; POAG primary open angle glaucoma; C/D cup-to-disc ratio; HVF humphrey visual field; GVF goldmann visual field; OCT optical coherence tomography; IOP intraocular pressure; BRVO Branch retinal vein occlusion; CRVO central retinal vein occlusion; CRAO central retinal artery occlusion; BRAO branch retinal artery occlusion; RT retinal tear; SB scleral buckle; PPV pars plana vitrectomy; VH Vitreous hemorrhage; PRP panretinal laser photocoagulation; IVK intravitreal kenalog; VMT vitreomacular traction; MH Macular hole;  NVD neovascularization of the disc; NVE neovascularization elsewhere; AREDS age related eye disease study; ARMD age related macular degeneration; POAG primary open angle glaucoma; EBMD epithelial/anterior basement membrane dystrophy; ACIOL anterior  chamber intraocular lens; IOL intraocular lens; PCIOL posterior chamber intraocular lens; Phaco/IOL phacoemulsification with intraocular lens placement; [place] photorefractive keratectomy; LASIK laser assisted in situ keratomileusis; HTN hypertension; DM diabetes mellitus; COPD chronic obstructive pulmonary disease

## 2022-07-25 NOTE — Progress Notes (Signed)
[facility] - Clinic Note  07/26/2022    CHIEF COMPLAINT Patient presents for Retina Follow Up  HISTORY OF PRESENT ILLNESS: Eric Key is a 67 y.o. male who presents to the clinic today for:   HPI     Retina Follow Up   Patient presents with  Wet AMD.  In left eye.  This started 6 weeks ago.  I, the attending physician,  performed the HPI with the patient and updated documentation appropriately.        Comments   Patient here for 6 weeks retina follow up for exu ARMD OS. Patient states vision is fine. No eye pain.       Last edited by Rennis ChrisZamora, Tyon Cerasoli, MD on 07/26/2022  8:40 AM.    Pt states vision is stable  Referring physician: [name], Elige RadonJoshua A, MD [address] [place] [id number]  HISTORICAL INFORMATION:   Selected notes from the MEDICAL RECORD NUMBER Referred by Dr. Ginette OttoGertrude Roth for concern of mac heme OS.   CURRENT MEDICATIONS: No current outpatient medications on file. (Ophthalmic Drugs)   No current facility-administered medications for this visit. (Ophthalmic Drugs)   Current Outpatient Medications (Other)  Medication Sig   buPROPion (ZYBAN) 150 MG 12 hr tablet Take 1 tablet (150 mg total) by mouth 2 (two) times daily.   meloxicam (MOBIC) 15 MG tablet Take 15 mg by mouth daily.   rosuvastatin (CRESTOR) 5 MG tablet Take 1 tablet (5 mg total) by mouth daily.   No current facility-administered medications for this visit. (Other)   REVIEW OF SYSTEMS: ROS   Positive for: Eyes Negative for: Constitutional, Gastrointestinal, Neurological, Skin, Genitourinary, Musculoskeletal, HENT, Endocrine, Cardiovascular, Respiratory, Psychiatric, Allergic/Imm, Heme/Lymph Last edited by Laddie Aquaslarke, Rebecca S, COA on 07/26/2022  7:47 AM.      ALLERGIES Allergies  Allergen Reactions   Penicillins Hives   PAST MEDICAL HISTORY Past Medical History:  Diagnosis Date   Cataract    Mixed form OU   History of adenomatous polyps of colon 03/16/2020    Hypertension    past hx    Hypertensive retinopathy    OU   Macular degeneration    Dry OD; Wet OS   Past Surgical History:  Procedure Laterality Date   ACHILLES TENDON REPAIR  2010   CHOLECYSTECTOMY     COLONOSCOPY     GALLBLADDER SURGERY     FAMILY HISTORY Family History  Problem Relation Age of Onset   Diabetes Brother    Heart disease Brother    Diabetes Maternal Grandfather    Colon polyps Neg Hx    Colon cancer Neg Hx    Esophageal cancer Neg Hx    Rectal cancer Neg Hx    Stomach cancer Neg Hx    SOCIAL HISTORY Social History   Tobacco Use   Smoking status: Some Days    Types: Cigarettes, Cigars    Last attempt to quit: 02/05/2020    Years since quitting: 2.4   Smokeless tobacco: Never  Vaping Use   Vaping Use: Never used  Substance Use Topics   Alcohol use: Yes    Comment: occ   Drug use: Never       OPHTHALMIC EXAM: Base Eye Exam     Visual Acuity (Snellen - Linear)       Right Left   Dist cc 20/25 -2 20/30   Dist ph cc  NI    Correction: Glasses  Tonometry (Tonopen, 7:45 AM)       Right Left   Pressure 11 06         Pupils       Dark Light Shape React APD   Right 3 2 Round Brisk None   Left 3 2 Round Brisk None         Visual Fields (Counting fingers)       Left Right    Full Full         Extraocular Movement       Right Left    Full, Ortho Full, Ortho         Neuro/Psych     Oriented x3: Yes   Mood/Affect: Normal         Dilation     Both eyes: 1.0% Mydriacyl, 2.5% Phenylephrine @ 7:45 AM           Slit Lamp and Fundus Exam     Slit Lamp Exam       Right Left   Lids/Lashes Dermatochalasis - upper lid Dermatochalasis - upper lid   Conjunctiva/Sclera White and quiet White and quiet   Cornea 1-2+ PEE, mild Arcus, trace tear film debris mild Arcus, 2+ inferior punctate epithelial erosions centrally, mild tear film debris, 2.77mm epi defect at 0230 limbus -- closed   Anterior Chamber  deep, clear, narrow angles deep, clear, narrow angles   Iris Round and moderately dilated, mild anterior bowing Round and moderately dilated, mild anterior bowing   Lens 2-3+ Nuclear sclerosis, 2-3+ Cortical cataract 2-3+ Nuclear sclerosis, 2-3+ Cortical cataract   Anterior Vitreous Vitreous syneresis Vitreous syneresis         Fundus Exam       Right Left   Disc Pink and Sharp, focal PPP ST, Compact Pink and Sharp, Compact   C/D Ratio 0.2 0.4   Macula Flat, Drusen, RPE mottling and clumping, early Atrophy, punctate IRH - improved Abnormal foveal reflex, +CNV with heme improved, +central edema / cystic changes -- improved, Drusen, RPE mottling and clumping, early subretinal fibrosis, punctate IRH - improved   Vessels attenuated, tortuous, perivascular pigment clumping peripherally attenuated, tortuous, perivascular pigment clumping peripherally   Periphery Attached, peripheral pigmented CR scarring and perivascular bone spicules 360 Attached, peripheral pigmented CR scarring and perivascular bone spicules 360           Refraction     Wearing Rx       Sphere Cylinder Axis Add   Right [place] +1.25 177 +2.25   Left [place] +0.75 170 +2.25           IMAGING AND PROCEDURES  Imaging and Procedures for @TODAY @  OCT, Retina - OU - Both Eyes       Right Eye Quality was good. Central Foveal Thickness: 287. Progression has been stable. Findings include normal foveal contour, no IRF, no SRF, retinal drusen , pigment epithelial detachment, outer retinal atrophy (Persistent vitreous opacities; stable low lying PEDs and ORA, partial PVD).   Left Eye Quality was good. Central Foveal Thickness: 298. Progression has been stable. Findings include abnormal foveal contour, retinal drusen , subretinal hyper-reflective material, intraretinal fluid, pigment epithelial detachment, outer retinal atrophy (persistent IRF overlying stable low-lying PED / SRHM temporal fovea and macula, just trace cystic  changes remain).   Notes *Images captured and stored on drive  Diagnosis / Impression:  OD: non-exu ARMD w/ stable low lying PEDs and ORA OS: exu ARMD -- persistent IRF overlying stable low-lying PED / [facility]  temporal fovea and macula, just trace cystic changes remain  Clinical management:  See below  Abbreviations: NFP - Normal foveal profile. CME - cystoid macular edema. PED - pigment epithelial detachment. IRF - intraretinal fluid. SRF - subretinal fluid. EZ - ellipsoid zone. ERM - epiretinal membrane. ORA - outer retinal atrophy. ORT - outer retinal tubulation. SRHM - subretinal hyper-reflective material      Intravitreal Injection, Pharmacologic Agent - OS - Left Eye       Time Out 07/26/2022. 7:53 AM. Confirmed correct patient, procedure, site, and patient consented.   Anesthesia Topical anesthesia was used. Anesthetic medications included Lidocaine 2%, Proparacaine 0.5%.   Procedure Preparation included 5% betadine to ocular surface, eyelid speculum. A (32g) needle was used.   Injection: 6 mg faricimab-svoa 6 MG/0.05ML   Route: Intravitreal, Site: Left Eye   NDC: 16109-604-54, Lot: [id number], Expiration date: 05/16/2024, Waste: 0 mL   Post-op Post injection exam found visual acuity of at least counting fingers. The patient tolerated the procedure well. There were no complications. The patient received written and verbal post procedure care education. Post injection medications were not given.            ASSESSMENT/PLAN:   ICD-10-CM   1. Exudative age-related macular degeneration of left eye with active choroidal neovascularization  H35.3221 OCT, Retina - OU - Both Eyes    Intravitreal Injection, Pharmacologic Agent - OS - Left Eye    faricimab-svoa (VABYSMO) 6mg /0.11mL intravitreal injection    2. 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 Essential hypertension  I10      5. Hypertensive retinopathy of both eyes  H35.033     6. Combined forms of age-related cataract of both eyes  H25.813      1. 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, OS  - s/p IVA OS #1 (03.04.21), #2 (03.04.21), #3 (04.30.21) -- IVA resistance  - s/p IVE OS #1 (06.04.21), #2 (07.08.21), #3 (08.09.21), #4 (09.13.21), #5 (10.18.21), #6 (11.24.21), #7 (12.31.21), #8 (02.11.22), #9 (03.25.22), #10 (05.02.22), #11 (06.06.22), #12 (07.13.22), #13 (08.17.22), #14 (09.21.22), #15 (10.26.22), #16 (11.29.22), #17 (01.03.23), #18 (02.07.23), #19 (03.16.23), #20 (04.20.23), #21 (05.19.23), #22 (06.20.23) -- IVE resistance  - s/p IVV OS #1 (07/18.23), #2 (08.15.23), #3 (09.12.23), #4 (10.10.23), #5 (11.14.23), #6 (12.21.23), #7 (01.25.24), #8 (02.29.24)  - pt reports stable vision OS  - exam shows central CNV with +subretinal heme -- improved  - BCVA 20/30 OS (stable)  - OCT shows persistent IRF overlying stable low-lying PED / [facility] temporal fovea and macula, just trace cystic changes remain at 6 weeks  - recommend IVV OS #9 today, 04.11.24, w/ f/u in 6 wks  - pt wishes to proceed  - RBA of procedure discussed, questions answered  - informed consent obtained and signed  - see procedure note  - IVA consent form signed and scanned, 03.04.21  - IVE consent form re-signed and scanned, 04.20.23  - IVV consent form signed and scanned, 07.18.23  - f/u in 6 wks -- DFE/OCT, possible injection  2. Age related macular degeneration, non-exudative, OD  - The incidence, anatomy, and pathology of dry AMD, risk of progression, and the AREDS and AREDS 2 study including smoking risks discussed with patient.   - recommend Amsler grid monitoring  3. Pigmentary retinopathy OU  - peripheral pigmented CR scarring and perivascular pigment clumping / bone spicules OU  - VA remains  fairly good (20/20 OD, 20/40 OS with exudative ARMD OS)  - denies night blindness  - denies family history of vision problems  -  discussed findings  - broad differential for pigmentary retinopathy:  Congenital (RP), infectious (TB, lyme dx, syphilis, toxo, bartonella), autoimmune and toxic etiologies  - labs checked (4.2.21):  All essential WNL   Rheumatoid factor   CBC   Comprehensive metabolic panel     RPR   ANA   ESR, CRP   ACE   ANCA   Toxoplasma antibodies IgG / IgM   Quantiferon - TB Gold Plus   HLA, A, B, C (IR)   Fluorescent treponemal ab (fla) - IgG - bld   VDRL Serum   Bartonella antibody, 06.04.21  - [facility] -- drawn on 6.4.21                         Results Positive for AGBL5, ALMS1, OPA1 -- unknown significance              - monitor  4,5. Hypertensive retinopathy OU  - discussed importance of tight BP control  - continue to monitor  6. Mixed form age related cataracts OU  - The symptoms of cataract, surgical options, and treatments and risks were discussed with patient.  - discussed diagnosis and progression  - not yet visually significant  - continue to monitor  Ophthalmic Meds Ordered this visit:  Meds ordered this encounter  Medications   faricimab-svoa (VABYSMO) 6mg /0.38mL intravitreal injection     Return in about 6 weeks (around 09/06/2022) for f/u exu ARMD OS, DFE, OCT.  There are no Patient Instructions on file for this visit.  This document serves as a record of services personally performed by Karie Chimera, MD, PhD. It was created on their behalf by Glee Arvin. Manson Passey, OA an ophthalmic technician. The creation of this record is the provider's dictation and/or activities during the visit.    Electronically signed by: Glee Arvin. [place], [place] 03.25.2024 9:10 AM  Karie Chimera, M.D., Ph.D. Diseases & Surgery of the Retina and Vitreous [facility]  I have reviewed the above documentation for accuracy and completeness, and I agree with the above. Karie Chimera, M.D., Ph.D. 07/26/22 9:10 AM   Abbreviations: M myopia (nearsighted); A  astigmatism; H hyperopia (farsighted); P presbyopia; Mrx spectacle prescription;  CTL contact lenses; OD right eye; OS left eye; OU both eyes  XT exotropia; ET esotropia; PEK punctate epithelial keratitis; PEE punctate epithelial erosions; DES dry eye syndrome; MGD meibomian gland dysfunction; ATs artificial tears; PFAT's preservative free artificial tears; NSC nuclear sclerotic cataract; PSC posterior subcapsular cataract; ERM epi-retinal membrane; PVD posterior vitreous detachment; RD retinal detachment; DM diabetes mellitus; DR diabetic retinopathy; NPDR non-proliferative diabetic retinopathy; PDR proliferative diabetic retinopathy; CSME clinically significant macular edema; DME diabetic macular edema; dbh dot blot hemorrhages; CWS cotton wool spot; POAG primary open angle glaucoma; C/D cup-to-disc ratio; HVF humphrey visual field; GVF goldmann visual field; OCT optical coherence tomography; IOP intraocular pressure; BRVO Branch retinal vein occlusion; CRVO central retinal vein occlusion; CRAO central retinal artery occlusion; BRAO branch retinal artery occlusion; RT retinal tear; SB scleral buckle; PPV pars plana vitrectomy; VH Vitreous hemorrhage; PRP panretinal laser photocoagulation; IVK intravitreal kenalog; VMT vitreomacular traction; MH Macular hole;  NVD neovascularization of the disc; NVE neovascularization elsewhere; AREDS age related eye disease study; ARMD age related macular degeneration; POAG primary open angle glaucoma; EBMD epithelial/anterior  basement membrane dystrophy; ACIOL anterior chamber intraocular lens; IOL intraocular lens; PCIOL posterior chamber intraocular lens; Phaco/IOL phacoemulsification with intraocular lens placement; [place] photorefractive keratectomy; LASIK laser assisted in situ keratomileusis; HTN hypertension; DM diabetes mellitus; COPD chronic obstructive pulmonary disease

## 2022-07-26 ENCOUNTER — Ambulatory Visit (INDEPENDENT_AMBULATORY_CARE_PROVIDER_SITE_OTHER): Payer: Medicare PPO | Admitting: Ophthalmology

## 2022-07-26 ENCOUNTER — Encounter (INDEPENDENT_AMBULATORY_CARE_PROVIDER_SITE_OTHER): Payer: Self-pay | Admitting: Ophthalmology

## 2022-07-26 DIAGNOSIS — H353112 Nonexudative age-related macular degeneration, right eye, intermediate dry stage: Secondary | ICD-10-CM

## 2022-07-26 DIAGNOSIS — H25813 Combined forms of age-related cataract, bilateral: Secondary | ICD-10-CM

## 2022-07-26 DIAGNOSIS — H35033 Hypertensive retinopathy, bilateral: Secondary | ICD-10-CM

## 2022-07-26 DIAGNOSIS — I10 Essential (primary) hypertension: Secondary | ICD-10-CM

## 2022-07-26 DIAGNOSIS — H3552 Pigmentary retinal dystrophy: Secondary | ICD-10-CM

## 2022-07-26 DIAGNOSIS — H353221 Exudative age-related macular degeneration, left eye, with active choroidal neovascularization: Secondary | ICD-10-CM | POA: Diagnosis not present

## 2022-07-26 DIAGNOSIS — H1830 Unspecified corneal membrane change: Secondary | ICD-10-CM

## 2022-07-26 MED ORDER — FARICIMAB-SVOA 6 MG/0.05ML IZ SOLN
6.0000 mg | INTRAVITREAL | Status: AC | PRN
  Administered 2022-07-26: 6 mg via INTRAVITREAL

## 2022-08-08 IMAGING — CT CT ANGIO CHEST-ABD-PELV FOR DISSECTION W/ AND WO/W CM
2 of 9 series · 13 of 46 positions shown, 15 images · non-contrast
Comparison: None.

CLINICAL DATA: Chest and abdominal pain.

EXAM:
CT ANGIOGRAPHY CHEST, ABDOMEN AND PELVIS
TECHNIQUE: Non-contrast CT of the chest was initially obtained.

[Series 6: axial arterial · axial · arterial · 0.98mm/px · z∈[-730,-61]mm · 10 of 249 slices shown, 12 images]
[im 13/249  soft-tissue]
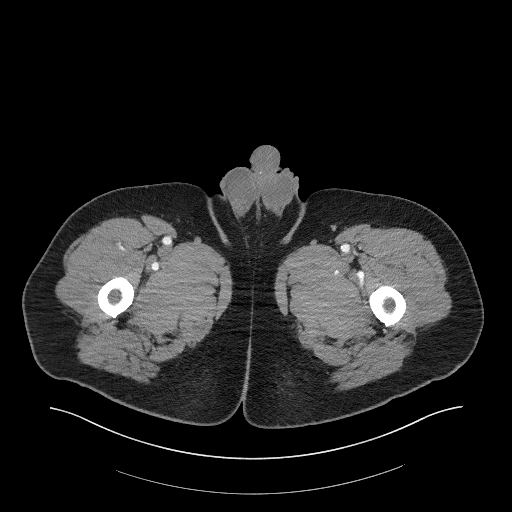
[im 13/249  bone]
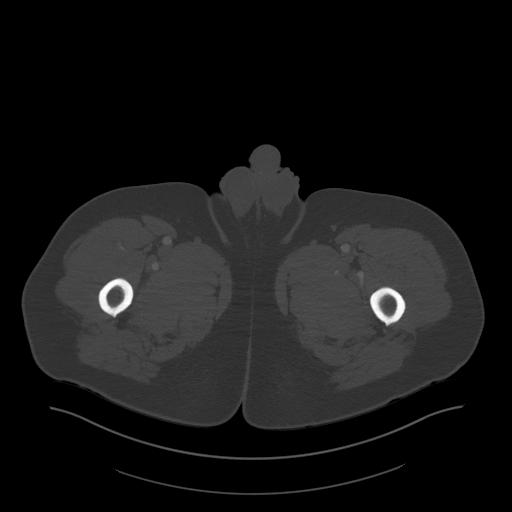
[im 38/249  soft-tissue]
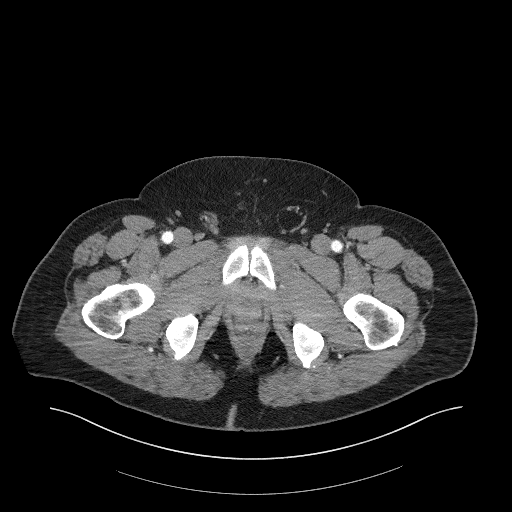
[im 63/249  soft-tissue]
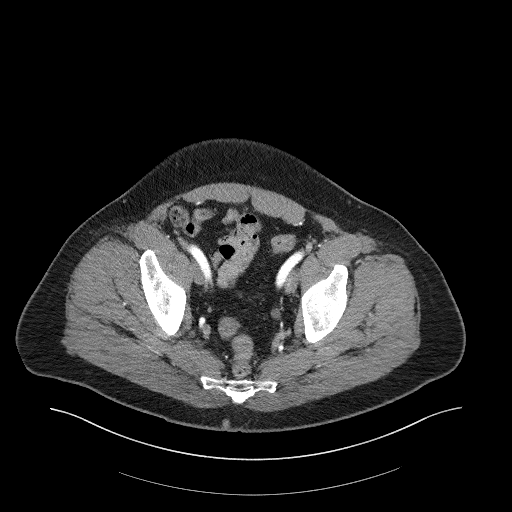
[im 87/249  soft-tissue]
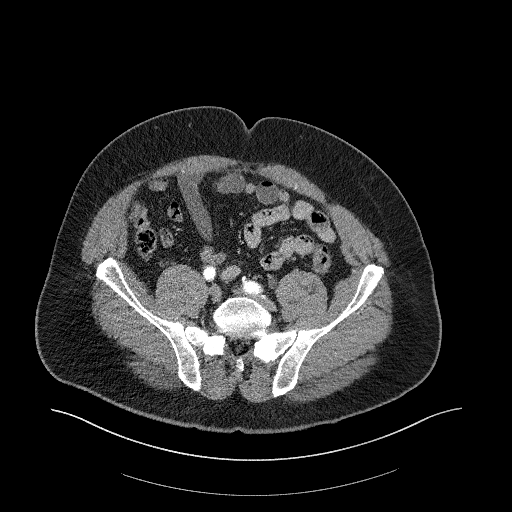
[im 112/249  soft-tissue]
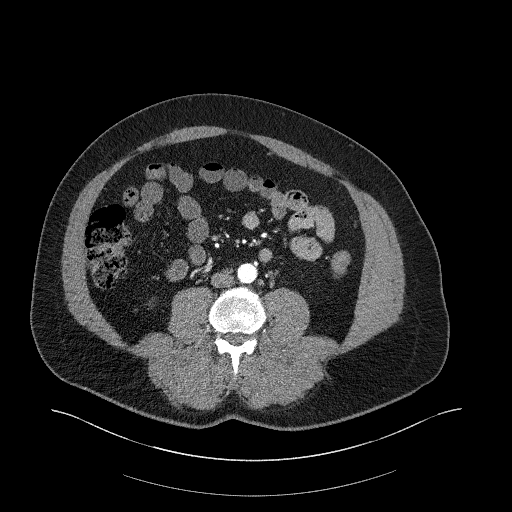
[im 137/249  soft-tissue]
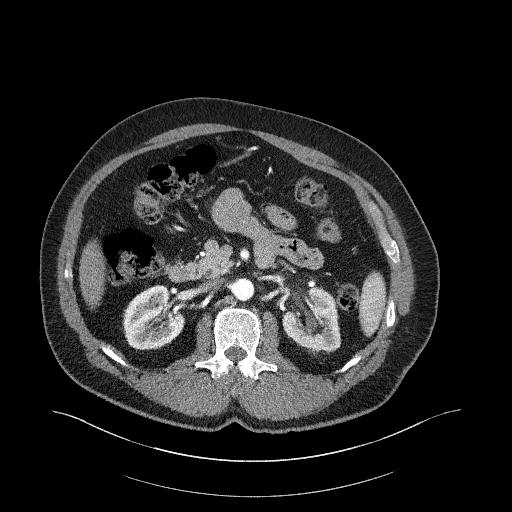
[im 162/249  soft-tissue]
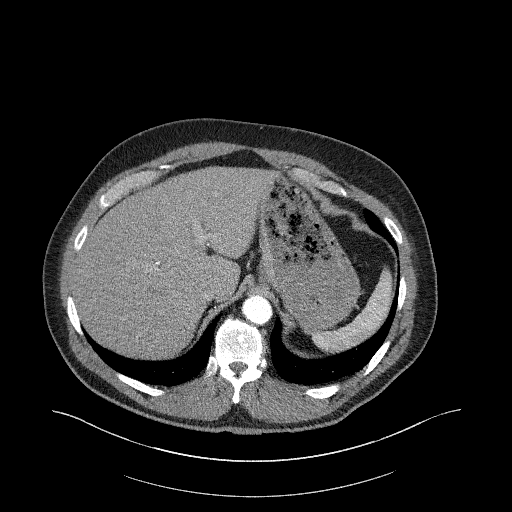
[im 187/249  soft-tissue]
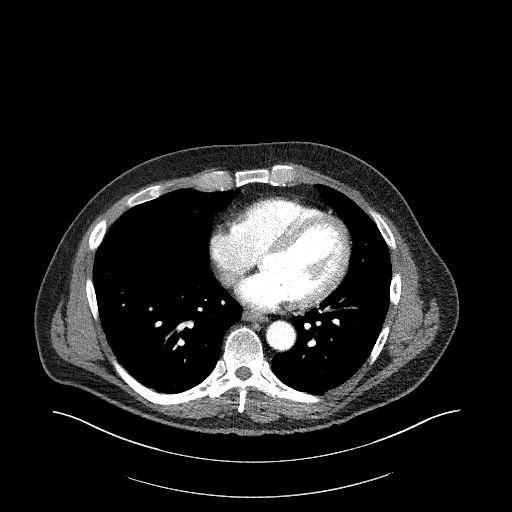
[im 211/249  soft-tissue]
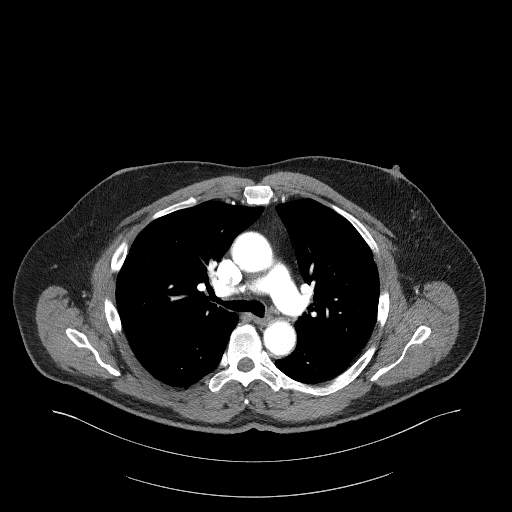
[im 211/249  bone]
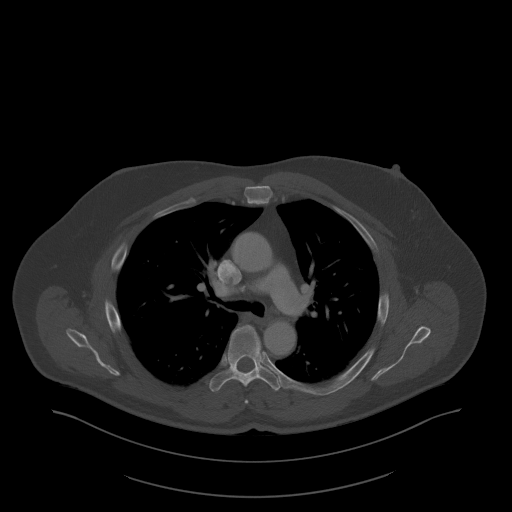
[im 236/249  soft-tissue]
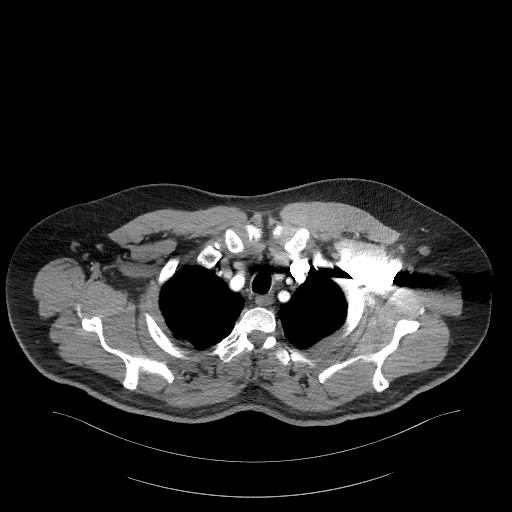

[Series 9: coronals · coronal · 0.97mm/px · 3 of 169 slices shown]
[im 43/169  soft-tissue]
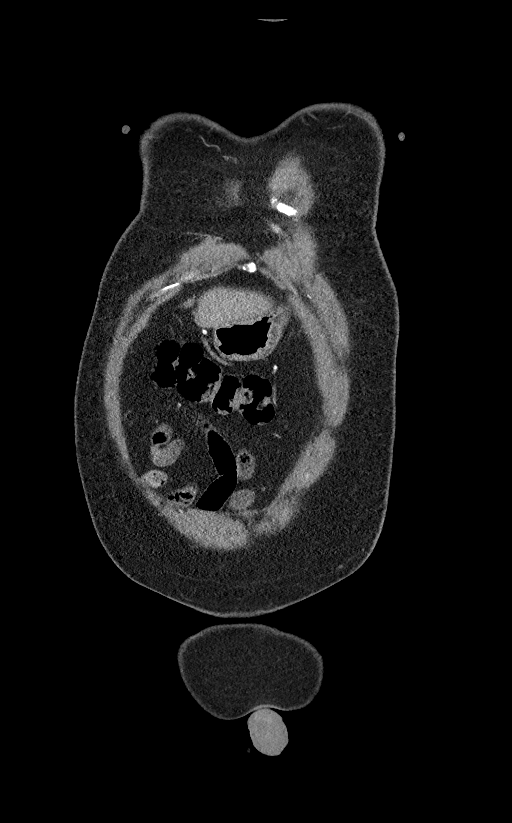
[im 85/169  soft-tissue]
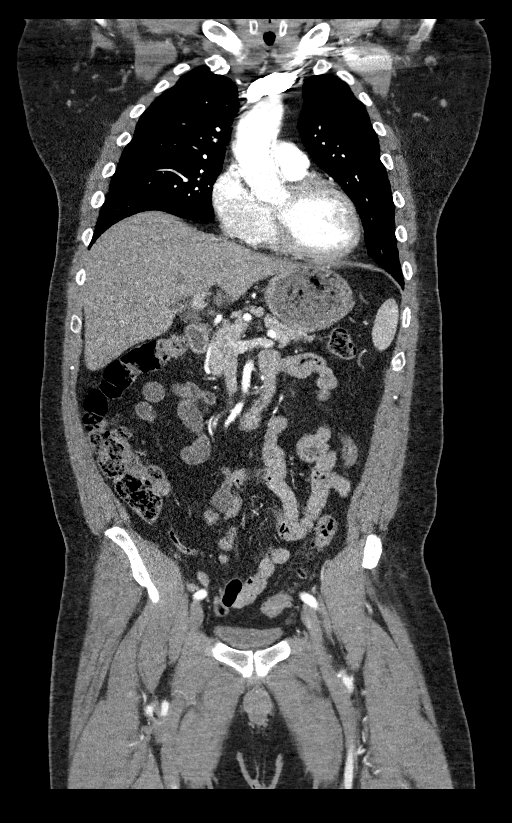
[im 127/169  soft-tissue]
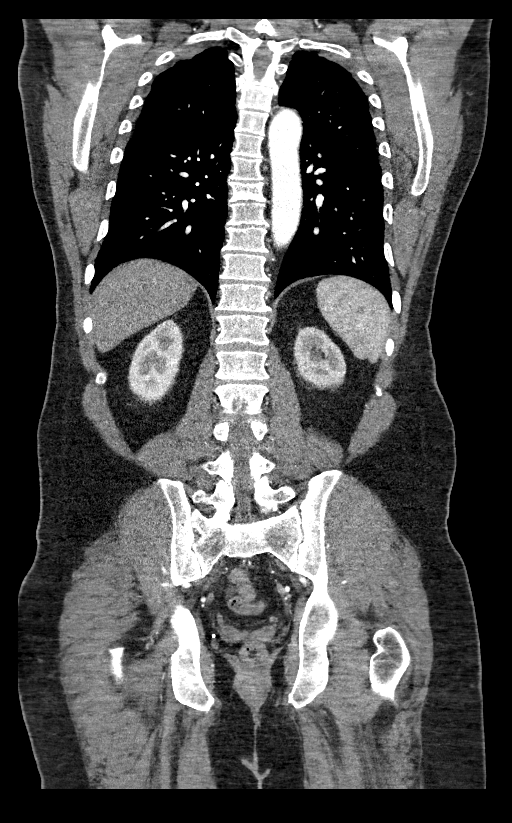

[13 of 46 positions shown; findings below may reference images not displayed]

Multidetector CT imaging through the chest, abdomen and pelvis was
performed using the standard protocol during bolus administration of
intravenous contrast. Multiplanar reconstructed images and MIPs were
obtained and reviewed to evaluate the vascular anatomy.

CONTRAST:  100mL OMNIPAQUE IOHEXOL 350 MG/ML SOLN
FINDINGS: CTA CHEST FINDINGS

Cardiovascular: The heart is normal in size. No pericardial
effusion. The aorta is normal in caliber. No dissection. No focal
aneurysm. The branch vessels are patent. Minimal scattered aortic
and coronary artery calcifications.

The pulmonary arterial tree is fairly well opacified. No filling
defects to suggest pulmonary embolism.

Mediastinum/Nodes: Calcified mediastinal and right hilar lymph nodes
consistent with remote granulomatous disease. No adenopathy. The
esophagus is grossly normal. The thyroid gland is unremarkable.

Lungs/Pleura: No acute pulmonary findings. No infiltrates, edema or
effusions. No worrisome pulmonary lesions. No interstitial lung
disease bronchiectasis.

Musculoskeletal: No chest wall mass, supraclavicular or axillary
adenopathy. The bony structures are intact. No worrisome bone
lesions.

Review of the MIP images confirms the above findings.

CTA ABDOMEN AND PELVIS FINDINGS

VASCULAR

Aorta: Normal caliber. Minimal scattered atherosclerotic
calcifications. No dissection.

Celiac: Normal

SMA: Minimal ostial calcification.  No aneurysm or stenosis.

Renals: 2 right renal arteries and 1 left renal artery. No
atherosclerotic calcifications or aneurysm.

IMA: Patent.

Inflow: Minimal atherosclerotic calcifications. No aneurysm or
dissection.

Veins: Grossly normal.

Review of the MIP images confirms the above findings.

NON-VASCULAR

Hepatobiliary: No hepatic lesions or intrahepatic biliary
dilatation. The gallbladder is surgically absent. No common bile
duct dilatation.

Pancreas: No mass, inflammation or ductal dilatation.

Spleen: Normal size.  No focal lesions.

Adrenals/Urinary Tract: The adrenal glands are normal.

Moderate left-sided hydroureteronephrosis due to a 1-2 mm ureteral
calculus distally at the level of the mid acetabulum. No right-sided
renal or ureteral calculi. No worrisome renal lesions. The bladder
is unremarkable.

Stomach/Bowel: The stomach, duodenum, small bowel and colon are
grossly normal without oral contrast. No inflammatory changes, mass
lesions or obstructive findings. The appendix is normal. Moderate
scattered sigmoid colon diverticulosis.

Lymphatic: No mesenteric or retroperitoneal mass or adenopathy.

Reproductive: The prostate gland and seminal vesicles are
unremarkable.

Other: No pelvic mass or adenopathy. No free pelvic fluid
collections. No inguinal mass or adenopathy. No abdominal wall
hernia or subcutaneous lesions. Small left inguinal hernia noted.

Musculoskeletal: No significant bony findings.

Review of the MIP images confirms the above findings.
IMPRESSION: 1. No CT findings for aortic aneurysm or dissection.
2. No CT findings for pulmonary embolism.
3. Moderate left-sided hydroureteronephrosis due to a 1-2 mm
ureteral calculus distally.
4. No acute abdominal/pelvic findings, mass lesions or
lymphadenopathy.
5. Status post cholecystectomy. No biliary dilatation.
6. Aortic atherosclerosis.

Aortic Atherosclerosis ([id number]).

## 2022-08-30 NOTE — Progress Notes (Signed)
[facility] - Clinic Note  09/03/2022    CHIEF COMPLAINT Patient presents for Retina Follow Up  HISTORY OF PRESENT ILLNESS: Eric Key is a 67 y.o. male who presents to the clinic today for:   HPI     Retina Follow Up   Patient presents with  Wet AMD.  In left eye.  This started 6 weeks ago.  I, the attending physician,  performed the HPI with the patient and updated documentation appropriately.        Comments   Patient here for 6 weeks retina follow up for exu ARMD OS. Patient states vision is fine. Needs new glasses. The glasses are all scratched up. No eye pain.      Last edited by Rennis Chris, MD on 09/03/2022  1:22 PM.    Pt states vision is stable  Referring physician: [name], [name], MD [address] [place],  [place] [address]  HISTORICAL INFORMATION:   Selected notes from the MEDICAL RECORD NUMBER Referred by Dr. Ginette Otto for concern of mac heme OS.   CURRENT MEDICATIONS: No current outpatient medications on file. (Ophthalmic Drugs)   No current facility-administered medications for this visit. (Ophthalmic Drugs)   Current Outpatient Medications (Other)  Medication Sig   buPROPion (ZYBAN) 150 MG 12 hr tablet Take 1 tablet (150 mg total) by mouth 2 (two) times daily.   meloxicam (MOBIC) 15 MG tablet Take 15 mg by mouth daily.   rosuvastatin (CRESTOR) 5 MG tablet Take 1 tablet (5 mg total) by mouth daily.   No current facility-administered medications for this visit. (Other)   REVIEW OF SYSTEMS: ROS   Positive for: Eyes Negative for: Constitutional, Gastrointestinal, Neurological, Skin, Genitourinary, Musculoskeletal, HENT, Endocrine, Cardiovascular, Respiratory, Psychiatric, Allergic/Imm, Heme/Lymph Last edited by Laddie Aquas, COA on 09/03/2022 10:08 AM.     ALLERGIES Allergies  Allergen Reactions   Penicillins Hives   PAST MEDICAL HISTORY Past Medical History:  Diagnosis Date   Cataract    Mixed form OU    History of adenomatous polyps of colon 03/16/2020   Hypertension    past hx    Hypertensive retinopathy    OU   Macular degeneration    Dry OD; Wet OS   Past Surgical History:  Procedure Laterality Date   ACHILLES TENDON REPAIR  2010   CHOLECYSTECTOMY     COLONOSCOPY     GALLBLADDER SURGERY     FAMILY HISTORY Family History  Problem Relation Age of Onset   Diabetes Brother    Heart disease Brother    Diabetes Maternal Grandfather    Colon polyps Neg Hx    Colon cancer Neg Hx    Esophageal cancer Neg Hx    Rectal cancer Neg Hx    Stomach cancer Neg Hx    SOCIAL HISTORY Social History   Tobacco Use   Smoking status: Some Days    Types: Cigarettes, Cigars    Last attempt to quit: 02/05/2020    Years since quitting: 2.5   Smokeless tobacco: Never  Vaping Use   Vaping Use: Never used  Substance Use Topics   Alcohol use: Yes    Comment: occ   Drug use: Never       OPHTHALMIC EXAM: Base Eye Exam     Visual Acuity (Snellen - Linear)       Right Left   Dist cc 20/20 -1 20/25    Correction: Glasses  Tonometry (Tonopen, 10:05 AM)       Right Left   Pressure 16 15         Pupils       Dark Light Shape React APD   Right 3 2 Round Brisk None   Left 3 2 Round Brisk None         Visual Fields (Counting fingers)       Left Right    Full Full         Extraocular Movement       Right Left    Full, Ortho Full, Ortho         Neuro/Psych     Oriented x3: Yes   Mood/Affect: Normal         Dilation     Both eyes: 1.0% Mydriacyl, 2.5% Phenylephrine @ 10:05 AM           Slit Lamp and Fundus Exam     Slit Lamp Exam       Right Left   Lids/Lashes Dermatochalasis - upper lid Dermatochalasis - upper lid   Conjunctiva/Sclera White and quiet White and quiet   Cornea 1-2+ PEE, mild Arcus, trace tear film debris mild Arcus, 2+ inferior punctate epithelial erosions centrally, mild tear film debris, 2.5mm epi defect at 0230 limbus  -- closed   Anterior Chamber deep, clear, narrow angles deep, clear, narrow angles   Iris Round and moderately dilated, mild anterior bowing Round and moderately dilated, mild anterior bowing   Lens 2-3+ Nuclear sclerosis, 2-3+ Cortical cataract 2-3+ Nuclear sclerosis, 2-3+ Cortical cataract   Anterior Vitreous Vitreous syneresis Vitreous syneresis         Fundus Exam       Right Left   Disc Pink and Sharp, focal PPP ST, Compact Pink and Sharp, Compact   C/D Ratio 0.2 0.4   Macula Flat, Drusen, RPE mottling and clumping, early Atrophy, punctate IRH Abnormal foveal reflex, +CNV with heme improved, +central edema / cystic changes -- improved, Drusen, RPE mottling and clumping, early subretinal fibrosis, punctate IRH - improved   Vessels attenuated, mild tortuousity, perivascular pigment clumping peripherally attenuated, mild tortuousity, perivascular pigment clumping peripherally   Periphery Attached, peripheral pigmented CR scarring and perivascular bone spicules 360, No heme Attached, peripheral pigmented CR scarring and perivascular bone spicules 360, No heme           Refraction     Wearing Rx       Sphere Cylinder Axis Add   Right [place] +1.25 177 +2.25   Left [place] +0.75 170 +2.25           IMAGING AND PROCEDURES  Imaging and Procedures for @TODAY @  OCT, Retina - OU - Both Eyes       Right Eye Quality was good. Central Foveal Thickness: 291. Progression has been stable. Findings include normal foveal contour, no IRF, no SRF, retinal drusen , pigment epithelial detachment, outer retinal atrophy (Persistent vitreous opacities; stable low lying PEDs and ORA, partial PVD).   Left Eye Quality was good. Central Foveal Thickness: 295. Progression has improved. Findings include abnormal foveal contour, retinal drusen , subretinal hyper-reflective material, intraretinal fluid, pigment epithelial detachment, outer retinal atrophy (persistent IRF overlying stable low-lying PED /  [facility] temporal fovea and macula -- improved, just trace cystic changes remain).   Notes *Images captured and stored on drive  Diagnosis / Impression:  OD: non-exu ARMD w/ stable low lying PEDs and ORA OS: exu ARMD -- persistent IRF overlying  stable low-lying PED / [facility] temporal fovea and macula -- improved, just trace cystic changes remain  Clinical management:  See below  Abbreviations: NFP - Normal foveal profile. CME - cystoid macular edema. PED - pigment epithelial detachment. IRF - intraretinal fluid. SRF - subretinal fluid. EZ - ellipsoid zone. ERM - epiretinal membrane. ORA - outer retinal atrophy. ORT - outer retinal tubulation. SRHM - subretinal hyper-reflective material      Intravitreal Injection, Pharmacologic Agent - OS - Left Eye       Time Out 09/03/2022. 11:29 AM. Confirmed correct patient, procedure, site, and patient consented.   Anesthesia Topical anesthesia was used. Anesthetic medications included Lidocaine 2%, Proparacaine 0.5%.   Procedure Preparation included 5% betadine to ocular surface, eyelid speculum. A (32g) needle was used.   Injection: 6 mg faricimab-svoa 6 MG/0.05ML   Route: Intravitreal, Site: Left Eye   NDC: O8010301, Lot: [id number], Expiration date: 07/14/2024, Waste: 0 mL   Post-op Post injection exam found visual acuity of at least counting fingers. The patient tolerated the procedure well. There were no complications. The patient received written and verbal post procedure care education. Post injection medications were not given.            ASSESSMENT/PLAN:   ICD-10-CM   1. Exudative age-related macular degeneration of left eye with active choroidal neovascularization (HCC)  H35.3221 OCT, Retina - OU - Both Eyes    Intravitreal Injection, Pharmacologic Agent - OS - Left Eye    faricimab-svoa (VABYSMO) 6mg /0.106mL intravitreal injection    2. 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 Essential hypertension  I10     5. Hypertensive retinopathy of both eyes  H35.033     6. Combined forms of age-related cataract of both eyes  H25.813      1. 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, OS  - s/p IVA OS #1 (03.04.21), #2 (03.04.21), #3 (04.30.21) -- IVA resistance  - s/p IVE OS #1 (06.04.21), #2 (07.08.21), #3 (08.09.21), #4 (09.13.21), #5 (10.18.21), #6 (11.24.21), #7 (12.31.21), #8 (02.11.22), #9 (03.25.22), #10 (05.02.22), #11 (06.06.22), #12 (07.13.22), #13 (08.17.22), #14 (09.21.22), #15 (10.26.22), #16 (11.29.22), #17 (01.03.23), #18 (02.07.23), #19 (03.16.23), #20 (04.20.23), #21 (05.19.23), #22 (06.20.23) -- IVE resistance  - s/p IVV OS #1 (07/18.23), #2 (08.15.23), #3 (09.12.23), #4 (10.10.23), #5 (11.14.23), #6 (12.21.23), #7 (01.25.24), #8 (02.29.24), #9 (04.11.24)  - pt reports stable vision OS  - exam shows central CNV with +subretinal heme -- improved  - BCVA 20/25 OS (improved)  - OCT shows persistent IRF overlying stable low-lying PED / [facility] temporal fovea and macula -- improved, just trace cystic changes remain at 6 weeks  - recommend IVV OS #10 today, 05.20.24, w/ f/u in 6 wks  - pt wishes to proceed  - RBA of procedure discussed, questions answered  - informed consent obtained and signed  - see procedure note  - IVA consent form signed and scanned, 03.04.21  - IVE consent form re-signed and scanned, 04.20.23  - IVV consent form signed and scanned, 07.18.23  - f/u in 6 wks -- DFE/OCT, possible injection  2. Age related macular degeneration, non-exudative, OD  - The incidence, anatomy, and pathology of dry AMD, risk of progression, and the AREDS and AREDS 2 study including smoking risks discussed with patient.   - recommend Amsler grid monitoring  3. Pigmentary retinopathy OU  - peripheral pigmented CR scarring  and perivascular pigment clumping / bone spicules OU  - VA remains fairly good (20/20 OD, 20/40 OS  with exudative ARMD OS)  - denies night blindness  - denies family history of vision problems  - discussed findings  - broad differential for pigmentary retinopathy:  Congenital (RP), infectious (TB, lyme dx, syphilis, toxo, bartonella), autoimmune and toxic etiologies  - labs checked (4.2.21):  All essential WNL   Rheumatoid factor   CBC   Comprehensive metabolic panel     RPR   ANA   ESR, CRP   ACE   ANCA   Toxoplasma antibodies IgG / IgM   Quantiferon - TB Gold Plus   HLA, A, B, C (IR)   Fluorescent treponemal ab (fla) - IgG - bld   VDRL Serum   Bartonella antibody, 06.04.21  - [facility] -- drawn on 6.4.21                         Results Positive for AGBL5, ALMS1, OPA1 -- unknown significance              - monitor  4,5. Hypertensive retinopathy OU  - discussed importance of tight BP control  - continue to monitor  6. Mixed form age related cataracts OU  - The symptoms of cataract, surgical options, and treatments and risks were discussed with patient.  - discussed diagnosis and progression  - not yet visually significant  - continue to monitor  Ophthalmic Meds Ordered this visit:  Meds ordered this encounter  Medications   faricimab-svoa (VABYSMO) 6mg /0.9mL intravitreal injection     Return in about 6 weeks (around 10/15/2022) for f/u exu ARMD OS, DFE, OCT.  There are no Patient Instructions on file for this visit.  This document serves as a record of services personally performed by Karie Chimera, MD, PhD. It was created on their behalf by Annalee Genta, COMT. The creation of this record is the provider's dictation and/or activities during the visit.  Electronically signed by: Annalee Genta, COMT 09/03/22 1:23 PM  This document serves as a record of services personally performed by Karie Chimera, MD, PhD. It was created on their behalf by Glee Arvin. Manson Passey, OA an ophthalmic technician. The creation of this record is the provider's dictation and/or  activities during the visit.    Electronically signed by: Glee Arvin. Rud[place], [place] 05.20.2024 1:23 PM   Karie Chimera, M.D., Ph.D. Diseases & Surgery of the Retina and Vitreous [facility]  I have reviewed the above documentation for accuracy and completeness, and I agree with the above. Karie Chimera, M.D., Ph.D. 09/03/22 1:25 PM  Abbreviations: M myopia (nearsighted); A astigmatism; H hyperopia (farsighted); P presbyopia; Mrx spectacle prescription;  CTL contact lenses; OD right eye; OS left eye; OU both eyes  XT exotropia; ET esotropia; PEK punctate epithelial keratitis; PEE punctate epithelial erosions; DES dry eye syndrome; MGD meibomian gland dysfunction; ATs artificial tears; PFAT's preservative free artificial tears; NSC nuclear sclerotic cataract; PSC posterior subcapsular cataract; ERM epi-retinal membrane; PVD posterior vitreous detachment; RD retinal detachment; DM diabetes mellitus; DR diabetic retinopathy; NPDR non-proliferative diabetic retinopathy; PDR proliferative diabetic retinopathy; CSME clinically significant macular edema; DME diabetic macular edema; dbh dot blot hemorrhages; CWS cotton wool spot; POAG primary open angle glaucoma; C/D cup-to-disc ratio; HVF humphrey visual field; GVF goldmann visual field; OCT optical coherence tomography; IOP intraocular pressure; BRVO Branch retinal vein occlusion; CRVO central retinal vein occlusion;  CRAO central retinal artery occlusion; BRAO branch retinal artery occlusion; RT retinal tear; SB scleral buckle; PPV pars plana vitrectomy; VH Vitreous hemorrhage; PRP panretinal laser photocoagulation; IVK intravitreal kenalog; VMT vitreomacular traction; MH Macular hole;  NVD neovascularization of the disc; NVE neovascularization elsewhere; AREDS age related eye disease study; ARMD age related macular degeneration; POAG primary open angle glaucoma; EBMD epithelial/anterior basement membrane dystrophy; ACIOL anterior chamber  intraocular lens; IOL intraocular lens; PCIOL posterior chamber intraocular lens; Phaco/IOL phacoemulsification with intraocular lens placement; PRK photorefractive keratectomy; LASIK laser assisted in situ keratomileusis; HTN hypertension; DM diabetes mellitus; COPD chronic obstructive pulmonary disease

## 2022-09-03 ENCOUNTER — Ambulatory Visit (INDEPENDENT_AMBULATORY_CARE_PROVIDER_SITE_OTHER): Payer: Medicare PPO | Admitting: Ophthalmology

## 2022-09-03 ENCOUNTER — Encounter (INDEPENDENT_AMBULATORY_CARE_PROVIDER_SITE_OTHER): Payer: Self-pay | Admitting: Ophthalmology

## 2022-09-03 DIAGNOSIS — H3552 Pigmentary retinal dystrophy: Secondary | ICD-10-CM

## 2022-09-03 DIAGNOSIS — H353112 Nonexudative age-related macular degeneration, right eye, intermediate dry stage: Secondary | ICD-10-CM | POA: Diagnosis not present

## 2022-09-03 DIAGNOSIS — H353221 Exudative age-related macular degeneration, left eye, with active choroidal neovascularization: Secondary | ICD-10-CM

## 2022-09-03 DIAGNOSIS — I10 Essential (primary) hypertension: Secondary | ICD-10-CM | POA: Diagnosis not present

## 2022-09-03 DIAGNOSIS — H25813 Combined forms of age-related cataract, bilateral: Secondary | ICD-10-CM

## 2022-09-03 DIAGNOSIS — H35033 Hypertensive retinopathy, bilateral: Secondary | ICD-10-CM

## 2022-09-03 MED ORDER — FARICIMAB-SVOA 6 MG/0.05ML IZ SOLN
6.0000 mg | INTRAVITREAL | Status: AC | PRN
Start: 2022-09-03 — End: 2022-09-03
  Administered 2022-09-03: 6 mg via INTRAVITREAL

## 2022-09-05 ENCOUNTER — Encounter (INDEPENDENT_AMBULATORY_CARE_PROVIDER_SITE_OTHER): Payer: Medicare PPO | Admitting: Ophthalmology

## 2022-10-08 NOTE — Progress Notes (Signed)
[facility] - Clinic Note  10/15/2022    CHIEF COMPLAINT Patient presents for Retina Follow Up  HISTORY OF PRESENT ILLNESS: Eric Key is a 67 y.o. male who presents to the clinic today for:   HPI     Retina Follow Up   In left eye.  This started 6 weeks ago.  Duration of 6 weeks.  Since onset it is stable.  I, the attending physician,  performed the HPI with the patient and updated documentation appropriately.        Comments   6 week retina follow up AMD and IVV OS pt denies any vision changes noticed he denies any flashes or floaters       Last edited by Rennis Chris, MD on 10/15/2022  8:59 AM.    Pt states vision is "normal", he is going to get new glasses this weekend  Referring physician: [name], [name], MD [address] [place],  [place] [address]  HISTORICAL INFORMATION:   Selected notes from the MEDICAL RECORD NUMBER Referred by Dr. Ginette Otto for concern of mac heme OS.   CURRENT MEDICATIONS: No current outpatient medications on file. (Ophthalmic Drugs)   No current facility-administered medications for this visit. (Ophthalmic Drugs)   Current Outpatient Medications (Other)  Medication Sig   buPROPion (ZYBAN) 150 MG 12 hr tablet Take 1 tablet (150 mg total) by mouth 2 (two) times daily.   meloxicam (MOBIC) 15 MG tablet Take 15 mg by mouth daily.   rosuvastatin (CRESTOR) 5 MG tablet Take 1 tablet (5 mg total) by mouth daily.   No current facility-administered medications for this visit. (Other)   REVIEW OF SYSTEMS: ROS   Positive for: Eyes Negative for: Constitutional, Gastrointestinal, Neurological, Skin, Genitourinary, Musculoskeletal, HENT, Endocrine, Cardiovascular, Respiratory, Psychiatric, Allergic/Imm, Heme/Lymph Last edited by Etheleen Mayhew, COT on 10/15/2022  7:54 AM.      ALLERGIES Allergies  Allergen Reactions   Penicillins Hives   PAST MEDICAL HISTORY Past Medical History:  Diagnosis Date   Cataract     Mixed form OU   History of adenomatous polyps of colon 03/16/2020   Hypertension    past hx    Hypertensive retinopathy    OU   Macular degeneration    Dry OD; Wet OS   Past Surgical History:  Procedure Laterality Date   ACHILLES TENDON REPAIR  2010   CHOLECYSTECTOMY     COLONOSCOPY     GALLBLADDER SURGERY     FAMILY HISTORY Family History  Problem Relation Age of Onset   Diabetes Brother    Heart disease Brother    Diabetes Maternal Grandfather    Colon polyps Neg Hx    Colon cancer Neg Hx    Esophageal cancer Neg Hx    Rectal cancer Neg Hx    Stomach cancer Neg Hx    SOCIAL HISTORY Social History   Tobacco Use   Smoking status: Some Days    Types: Cigarettes, Cigars    Last attempt to quit: 02/05/2020    Years since quitting: 2.6   Smokeless tobacco: Never  Vaping Use   Vaping Use: Never used  Substance Use Topics   Alcohol use: Yes    Comment: occ   Drug use: Never       OPHTHALMIC EXAM: Base Eye Exam     Visual Acuity (Snellen - Linear)       Right Left   Dist cc 20/25 -2 20/30 -1   Dist  ph cc NI NI    Correction: Glasses         Tonometry (Tonopen, 7:58 AM)       Right Left   Pressure 14 10         Pupils       Pupils Dark Light Shape React APD   Right PERRL 3 2 Round Brisk None   Left PERRL 3 2 Round Brisk None         Visual Fields       Left Right    Full Full         Extraocular Movement       Right Left    Full, Ortho Full, Ortho         Neuro/Psych     Oriented x3: Yes   Mood/Affect: Normal         Dilation     Both eyes: 2.5% Phenylephrine @ 7:58 AM           Slit Lamp and Fundus Exam     Slit Lamp Exam       Right Left   Lids/Lashes Dermatochalasis - upper lid Dermatochalasis - upper lid   Conjunctiva/Sclera White and quiet White and quiet   Cornea 1-2+ PEE, mild Arcus, trace tear film debris mild Arcus, 2+ inferior punctate epithelial erosions centrally, mild tear film debris, 2.108mm  epi defect at 0230 limbus -- closed   Anterior Chamber deep, clear, narrow angles deep, clear, narrow angles   Iris Round and moderately dilated, mild anterior bowing Round and moderately dilated, mild anterior bowing   Lens 2-3+ Nuclear sclerosis, 2-3+ Cortical cataract 2-3+ Nuclear sclerosis, 2-3+ Cortical cataract   Anterior Vitreous Vitreous syneresis Vitreous syneresis         Fundus Exam       Right Left   Disc Pink and Sharp, focal PPP ST, Compact Pink and Sharp, Compact   C/D Ratio 0.2 0.4   Macula Flat, Drusen, RPE mottling and clumping, early Atrophy, punctate IRH -- fading Abnormal foveal reflex, +CNV with heme improved, +central edema / cystic changes -- stably improved, Drusen, RPE mottling and clumping, early subretinal fibrosis, punctate IRH - improved   Vessels attenuated, tortuous, perivascular pigment clumping peripherally attenuated, mild tortuousity, perivascular pigment clumping peripherally   Periphery Attached, peripheral pigmented CR scarring and perivascular bone spicules 360, No heme Attached, peripheral pigmented CR scarring and perivascular bone spicules 360, No heme           Refraction     Wearing Rx       Sphere Cylinder Axis Add   Right [place] +1.25 177 +2.25   Left [place] +0.75 170 +2.25           IMAGING AND PROCEDURES  Imaging and Procedures for @TODAY @  OCT, Retina - OU - Both Eyes       Right Eye Quality was good. Central Foveal Thickness: 294. Progression has been stable. Findings include normal foveal contour, no IRF, no SRF, retinal drusen , pigment epithelial detachment, outer retinal atrophy (Persistent vitreous opacities; stable low lying PEDs and ORA, partial PVD).   Left Eye Quality was good. Central Foveal Thickness: 302. Progression has been stable. Findings include abnormal foveal contour, retinal drusen , subretinal hyper-reflective material, intraretinal fluid, pigment epithelial detachment, outer retinal atrophy  (persistent IRF overlying stable low-lying PED / SRHM temporal fovea and macula -- stably improved, just trace cystic changes remain).   Notes *Images captured and stored on drive  Diagnosis /  Impression:  OD: non-exu ARMD w/ stable low lying PEDs and ORA OS: exu ARMD -- persistent IRF overlying stable low-lying PED / SRHM temporal fovea and macula -- stably improved, just trace cystic changes remain  Clinical management:  See below  Abbreviations: NFP - Normal foveal profile. CME - cystoid macular edema. PED - pigment epithelial detachment. IRF - intraretinal fluid. SRF - subretinal fluid. EZ - ellipsoid zone. ERM - epiretinal membrane. ORA - outer retinal atrophy. ORT - outer retinal tubulation. SRHM - subretinal hyper-reflective material      Intravitreal Injection, Pharmacologic Agent - OS - Left Eye       Time Out 10/15/2022. 8:42 AM. Confirmed correct patient, procedure, site, and patient consented.   Anesthesia Topical anesthesia was used. Anesthetic medications included Lidocaine 2%, Proparacaine 0.5%.   Procedure Preparation included 5% betadine to ocular surface, eyelid speculum. A (32g) needle was used.   Injection: 6 mg faricimab-svoa 6 MG/0.05ML   Route: Intravitreal, Site: Left Eye   NDC: O8010301, Lot: [id number], Expiration date: 08/13/2024, Waste: 0 mL   Post-op Post injection exam found visual acuity of at least counting fingers. The patient tolerated the procedure well. There were no complications. The patient received written and verbal post procedure care education. Post injection medications were not given.            ASSESSMENT/PLAN:   ICD-10-CM   1. Exudative age-related macular degeneration of left eye with active choroidal neovascularization (HCC)  H35.3221 OCT, Retina - OU - Both Eyes    Intravitreal Injection, Pharmacologic Agent - OS - Left Eye    faricimab-svoa (VABYSMO) 6mg /0.71mL intravitreal injection    2. 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 Essential hypertension  I10     5. Hypertensive retinopathy of both eyes  H35.033     6. Combined forms of age-related cataract of both eyes  H25.813      1. 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, OS  - s/p IVA OS #1 (03.04.21), #2 (03.04.21), #3 (04.30.21) -- IVA resistance  - s/p IVE OS #1 (06.04.21), #2 (07.08.21), #3 (08.09.21), #4 (09.13.21), #5 (10.18.21), #6 (11.24.21), #7 (12.31.21), #8 (02.11.22), #9 (03.25.22), #10 (05.02.22), #11 (06.06.22), #12 (07.13.22), #13 (08.17.22), #14 (09.21.22), #15 (10.26.22), #16 (11.29.22), #17 (01.03.23), #18 (02.07.23), #19 (03.16.23), #20 (04.20.23), #21 (05.19.23), #22 (06.20.23) -- IVE resistance  - s/p IVV OS #1 (07/18.23), #2 (08.15.23), #3 (09.12.23), #4 (10.10.23), #5 (11.14.23), #6 (12.21.23), #7 (01.25.24), #8 (02.29.24), #9 (04.11.24), #10 (05.20.24)  - pt reports stable vision OS  - exam shows central CNV with +subretinal heme -- improved  - BCVA 20/30 OS (decreased)  - OCT shows persistent IRF overlying stable low-lying PED / [facility] temporal fovea and macula -- stably improved, just trace cystic changes remain at 6 weeks  - recommend IVV OS #11 today, 07.01.24, w/ f/u in 6 wks  - pt wishes to proceed  - RBA of procedure discussed, questions answered  - informed consent obtained and signed  - see procedure note  - IVV consent form signed and scanned, 07.18.23  - f/u in 6 wks -- DFE/OCT, possible injection  2. Age related macular degeneration, non-exudative, OD  - The incidence, anatomy, and pathology of dry AMD, risk of progression, and the AREDS and AREDS 2 study including smoking risks discussed with patient.   - recommend Amsler grid monitoring  3. Pigmentary retinopathy OU  -  peripheral pigmented CR scarring and perivascular pigment clumping / bone spicules OU  - VA remains fairly good (20/20 OD, 20/40 OS with  exudative ARMD OS)  - denies night blindness  - denies family history of vision problems  - discussed findings  - broad differential for pigmentary retinopathy:  Congenital (RP), infectious (TB, lyme dx, syphilis, toxo, bartonella), autoimmune and toxic etiologies  - labs checked (4.2.21):  All essential WNL   Rheumatoid factor   CBC   Comprehensive metabolic panel     RPR   ANA   ESR, CRP   ACE   ANCA   Toxoplasma antibodies IgG / IgM   Quantiferon - TB Gold Plus   HLA, A, B, C (IR)   Fluorescent treponemal ab (fla) - IgG - bld   VDRL Serum   Bartonella antibody, 06.04.21  - [facility] -- drawn on 6.4.21                         Results Positive for AGBL5, ALMS1, OPA1 -- unknown significance              - monitor  4,5. Hypertensive retinopathy OU  - discussed importance of tight BP control  - continue to monitor  6. Mixed form age related cataracts OU  - The symptoms of cataract, surgical options, and treatments and risks were discussed with patient.  - discussed diagnosis and progression  - not yet visually significant  - continue to monitor  Ophthalmic Meds Ordered this visit:  Meds ordered this encounter  Medications   faricimab-svoa (VABYSMO) 6mg /0.22mL intravitreal injection     Return in about 6 weeks (around 11/26/2022) for ex ARMD OS, Dilated Exam, OCT, Possible Injxn.  There are no Patient Instructions on file for this visit.  This document serves as a record of services personally performed by Karie Chimera, MD, PhD. It was created on their behalf by Glee Arvin. Manson Passey, OA an ophthalmic technician. The creation of this record is the provider's dictation and/or activities during the visit.    Electronically signed by: Glee Arvin. Manson Passey, [place] 06.24.2024 9:01 AM   Karie Chimera, M.D., Ph.D. Diseases & Surgery of the Retina and Vitreous [facility]  I have reviewed the above documentation for accuracy and completeness, and I  agree with the above. Karie Chimera, M.D., Ph.D. 10/15/22 9:01 AM   Abbreviations: M myopia (nearsighted); A astigmatism; H hyperopia (farsighted); P presbyopia; Mrx spectacle prescription;  CTL contact lenses; OD right eye; OS left eye; OU both eyes  XT exotropia; ET esotropia; PEK punctate epithelial keratitis; PEE punctate epithelial erosions; DES dry eye syndrome; MGD meibomian gland dysfunction; ATs artificial tears; PFAT's preservative free artificial tears; NSC nuclear sclerotic cataract; PSC posterior subcapsular cataract; ERM epi-retinal membrane; PVD posterior vitreous detachment; RD retinal detachment; DM diabetes mellitus; DR diabetic retinopathy; NPDR non-proliferative diabetic retinopathy; PDR proliferative diabetic retinopathy; CSME clinically significant macular edema; DME diabetic macular edema; dbh dot blot hemorrhages; CWS cotton wool spot; POAG primary open angle glaucoma; C/D cup-to-disc ratio; HVF humphrey visual field; GVF goldmann visual field; OCT optical coherence tomography; IOP intraocular pressure; BRVO Branch retinal vein occlusion; CRVO central retinal vein occlusion; CRAO central retinal artery occlusion; BRAO branch retinal artery occlusion; RT retinal tear; SB scleral buckle; PPV pars plana vitrectomy; VH Vitreous hemorrhage; PRP panretinal laser photocoagulation; IVK intravitreal kenalog; VMT vitreomacular traction; MH Macular hole;  NVD neovascularization of the disc; NVE neovascularization  elsewhere; AREDS age related eye disease study; ARMD age related macular degeneration; POAG primary open angle glaucoma; EBMD epithelial/anterior basement membrane dystrophy; ACIOL anterior chamber intraocular lens; IOL intraocular lens; PCIOL posterior chamber intraocular lens; Phaco/IOL phacoemulsification with intraocular lens placement; [place] photorefractive keratectomy; LASIK laser assisted in situ keratomileusis; HTN hypertension; DM diabetes mellitus; COPD chronic obstructive  pulmonary disease

## 2022-10-15 ENCOUNTER — Ambulatory Visit (INDEPENDENT_AMBULATORY_CARE_PROVIDER_SITE_OTHER): Payer: Medicare PPO | Admitting: Ophthalmology

## 2022-10-15 ENCOUNTER — Encounter (INDEPENDENT_AMBULATORY_CARE_PROVIDER_SITE_OTHER): Payer: Self-pay | Admitting: Ophthalmology

## 2022-10-15 DIAGNOSIS — I10 Essential (primary) hypertension: Secondary | ICD-10-CM

## 2022-10-15 DIAGNOSIS — H25813 Combined forms of age-related cataract, bilateral: Secondary | ICD-10-CM

## 2022-10-15 DIAGNOSIS — H353112 Nonexudative age-related macular degeneration, right eye, intermediate dry stage: Secondary | ICD-10-CM

## 2022-10-15 DIAGNOSIS — H353221 Exudative age-related macular degeneration, left eye, with active choroidal neovascularization: Secondary | ICD-10-CM

## 2022-10-15 DIAGNOSIS — H35033 Hypertensive retinopathy, bilateral: Secondary | ICD-10-CM

## 2022-10-15 DIAGNOSIS — H3552 Pigmentary retinal dystrophy: Secondary | ICD-10-CM

## 2022-10-15 MED ORDER — FARICIMAB-SVOA 6 MG/0.05ML IZ SOLN
6.0000 mg | INTRAVITREAL | Status: AC | PRN
Start: 2022-10-15 — End: 2022-10-15
  Administered 2022-10-15: 6 mg via INTRAVITREAL

## 2022-11-13 NOTE — Progress Notes (Addendum)
[facility] - Clinic Note  11/27/2022    CHIEF COMPLAINT Patient presents for Retina Follow Up  HISTORY OF PRESENT ILLNESS: Eric Key is a 67 y.o. male who presents to the clinic today for:   HPI     Retina Follow Up   Patient presents with  Wet AMD.  In left eye.  Severity is moderate.  Duration of 6 weeks.  Since onset it is stable.  I, the attending physician,  performed the HPI with the patient and updated documentation appropriately.        Comments   Pt here for 6 wk ret f/u exu ARMD OS. Pt states VA the same. Got new rx specs.       Last edited by Rennis Chris, MD on 11/27/2022  8:50 AM.    Pt states his vision is "fine", he thinks he had a bad day reading the eye chart, he got new glasses from Dr. Despina Arias  Referring physician: [name], [name], MD [address] [place],  [place] [address]  HISTORICAL INFORMATION:   Selected notes from the MEDICAL RECORD NUMBER Referred by Dr. Ginette Otto for concern of mac heme OS.   CURRENT MEDICATIONS: No current outpatient medications on file. (Ophthalmic Drugs)   No current facility-administered medications for this visit. (Ophthalmic Drugs)   Current Outpatient Medications (Other)  Medication Sig   buPROPion (ZYBAN) 150 MG 12 hr tablet Take 1 tablet (150 mg total) by mouth 2 (two) times daily.   meloxicam (MOBIC) 15 MG tablet Take 15 mg by mouth daily.   rosuvastatin (CRESTOR) 5 MG tablet Take 1 tablet (5 mg total) by mouth daily.   No current facility-administered medications for this visit. (Other)   REVIEW OF SYSTEMS: ROS   Positive for: Eyes Negative for: Constitutional, Gastrointestinal, Neurological, Skin, Genitourinary, Musculoskeletal, HENT, Endocrine, Cardiovascular, Respiratory, Psychiatric, Allergic/Imm, Heme/Lymph Last edited by Thompson Grayer, COT on 11/27/2022  7:49 AM.       ALLERGIES Allergies  Allergen Reactions   Penicillins Hives   PAST MEDICAL HISTORY Past  Medical History:  Diagnosis Date   Cataract    Mixed form OU   History of adenomatous polyps of colon 03/16/2020   Hypertension    past hx    Hypertensive retinopathy    OU   Macular degeneration    Dry OD; Wet OS   Past Surgical History:  Procedure Laterality Date   ACHILLES TENDON REPAIR  2010   CHOLECYSTECTOMY     COLONOSCOPY     GALLBLADDER SURGERY     FAMILY HISTORY Family History  Problem Relation Age of Onset   Diabetes Brother    Heart disease Brother    Diabetes Maternal Grandfather    Colon polyps Neg Hx    Colon cancer Neg Hx    Esophageal cancer Neg Hx    Rectal cancer Neg Hx    Stomach cancer Neg Hx    SOCIAL HISTORY Social History   Tobacco Use   Smoking status: Some Days    Current packs/day: 0.00    Types: Cigarettes, Cigars    Last attempt to quit: 02/05/2020    Years since quitting: 2.8   Smokeless tobacco: Never  Vaping Use   Vaping status: Never Used  Substance Use Topics   Alcohol use: Yes    Comment: occ   Drug use: Never       OPHTHALMIC EXAM: Base Eye Exam     Visual Acuity (Snellen -  Linear)       Right Left   Dist cc 20/25 +1 20/40   Dist ph cc  NI    Correction: Glasses         Tonometry (Tonopen, 7:56 AM)       Right Left   Pressure 12 12         Pupils       Pupils Dark Light Shape React APD   Right PERRL 3 2 Round Brisk None   Left PERRL 3 2 Round Brisk None         Visual Fields (Counting fingers)       Left Right    Full Full         Extraocular Movement       Right Left    Full, Ortho Full, Ortho         Neuro/Psych     Oriented x3: Yes   Mood/Affect: Normal         Dilation     Both eyes: 1.0% Mydriacyl, 2.5% Phenylephrine @ 7:57 AM           Slit Lamp and Fundus Exam     Slit Lamp Exam       Right Left   Lids/Lashes Dermatochalasis - upper lid Dermatochalasis - upper lid   Conjunctiva/Sclera White and quiet White and quiet   Cornea 2-3+ PEE, mild Arcus, tear film  debris mild Arcus, 1-2+ inferior punctate epithelial erosions, tear film debris, 2.102mm epi defect at 0230 limbus -- closed   Anterior Chamber deep, clear, narrow angles deep, clear, narrow angles   Iris Round and moderately dilated, mild anterior bowing Round and moderately dilated, mild anterior bowing   Lens 2-3+ Nuclear sclerosis, 2-3+ Cortical cataract 2-3+ Nuclear sclerosis, 2-3+ Cortical cataract   Anterior Vitreous Vitreous syneresis Vitreous syneresis         Fundus Exam       Right Left   Disc Pink and Sharp, focal PPP ST, Compact Pink and Sharp, Compact   C/D Ratio 0.2 0.4   Macula Flat, Drusen, RPE mottling and clumping, early Atrophy, punctate IRH -- fading Abnormal foveal reflex, +CNV with heme improved, +central edema / cystic changes -- improved, Drusen, RPE mottling and clumping, early subretinal fibrosis, punctate IRH - improved   Vessels attenuated, tortuous, perivascular pigment clumping peripherally attenuated, mild tortuousity, perivascular pigment clumping peripherally   Periphery Attached, peripheral pigmented CR scarring and perivascular bone spicules 360, No heme Attached, peripheral pigmented CR scarring and perivascular bone spicules 360, No heme           Refraction     Wearing Rx       Sphere Cylinder Axis Add   Right +0.25 +1.50 003 +2.00   Left +0.25 +0.75 169 +2.00  New rx specs MS        Manifest Refraction       Sphere Cylinder Axis Dist VA   Right       Left +0.25 +0.75 150 20/NI           IMAGING AND PROCEDURES  Imaging and Procedures for @TODAY @  OCT, Retina - OU - Both Eyes       Right Eye Quality was good. Central Foveal Thickness: 285. Progression has been stable. Findings include normal foveal contour, no IRF, no SRF, retinal drusen , pigment epithelial detachment, outer retinal atrophy (Persistent vitreous opacities; stable low lying PEDs and ORA, partial PVD).   Left Eye Quality was good. Central  Foveal Thickness: 292.  Progression has improved. Findings include abnormal foveal contour, retinal drusen , subretinal hyper-reflective material, intraretinal fluid, pigment epithelial detachment, outer retinal atrophy (Mild interval improvement in IRF overlying stable low-lying PED / [facility] temporal fovea and macula ).   Notes *Images captured and stored on drive  Diagnosis / Impression:  OD: non-exu ARMD w/ stable low lying PEDs and ORA OS: exu ARMD -- Mild interval improvement in IRF overlying stable low-lying PED / [facility] temporal fovea and macula   Clinical management:  See below  Abbreviations: NFP - Normal foveal profile. CME - cystoid macular edema. PED - pigment epithelial detachment. IRF - intraretinal fluid. SRF - subretinal fluid. EZ - ellipsoid zone. ERM - epiretinal membrane. ORA - outer retinal atrophy. ORT - outer retinal tubulation. SRHM - subretinal hyper-reflective material      Intravitreal Injection, Pharmacologic Agent - OS - Left Eye       Time Out 11/27/2022. 8:04 AM. Confirmed correct patient, procedure, site, and patient consented.   Anesthesia Topical anesthesia was used. Anesthetic medications included Lidocaine 2%, Proparacaine 0.5%.   Procedure Preparation included 5% betadine to ocular surface, eyelid speculum. A (32g) needle was used.   Injection: 6 mg faricimab-svoa 6 MG/0.05ML   Route: Intravitreal, Site: Left Eye   NDC: O8010301, Lot: [id number], Expiration date: 12/14/2024, Waste: 0 mL   Post-op Post injection exam found visual acuity of at least counting fingers. The patient tolerated the procedure well. There were no complications. The patient received written and verbal post procedure care education. Post injection medications were not given.            ASSESSMENT/PLAN:   ICD-10-CM   1. Exudative age-related macular degeneration of left eye with active choroidal neovascularization (HCC)  H35.3221 OCT, Retina - OU - Both Eyes    Intravitreal Injection,  Pharmacologic Agent - OS - Left Eye    faricimab-svoa (VABYSMO) 6mg /0.21mL intravitreal injection    2. 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 Essential hypertension  I10     5. Hypertensive retinopathy of both eyes  H35.033     6. Combined forms of age-related cataract of both eyes  H25.813     7. Dry eyes  H04.123      1. 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, OS  - s/p IVA OS #1 (03.04.21), #2 (03.04.21), #3 (04.30.21) -- IVA resistance  - s/p IVE OS #1 (06.04.21), #2 (07.08.21), #3 (08.09.21), #4 (09.13.21), #5 (10.18.21), #6 (11.24.21), #7 (12.31.21), #8 (02.11.22), #9 (03.25.22), #10 (05.02.22), #11 (06.06.22), #12 (07.13.22), #13 (08.17.22), #14 (09.21.22), #15 (10.26.22), #16 (11.29.22), #17 (01.03.23), #18 (02.07.23), #19 (03.16.23), #20 (04.20.23), #21 (05.19.23), #22 (06.20.23) -- IVE resistance  - s/p IVV OS #1 (07.18.23), #2 (08.15.23), #3 (09.12.23), #4 (10.10.23), #5 (11.14.23), #6 (12.21.23), #7 (01.25.24), #8 (02.29.24), #9 (04.11.24), #10 (05.20.24), #11 (07.01.24)  - pt reports stable vision OS  - exam shows central CNV with +subretinal heme -- improved  - BCVA 20/40 OS - decreased (dry eyes)  - OCT shows mild interval improvement in IRF overlying stable low-lying PED / [facility] temporal fovea and macula at 6 weeks  - recommend IVV OS #12 today, 08.13.24, w/ f/u ext to 7 wks  - pt wishes to proceed  - RBA of procedure discussed, questions answered  - IVV informed consent obtained and signed, 07.18.23  - see procedure note  - IVV consent  form signed and scanned, 07.18.23  - f/u in 7 wks -- DFE/OCT, possible injection  2. Age related macular degeneration, non-exudative, OD  - The incidence, anatomy, and pathology of dry AMD, risk of progression, and the AREDS and AREDS 2 study including smoking risks discussed with patient.   - recommend Amsler grid monitoring  3.  Pigmentary retinopathy OU  - peripheral pigmented CR scarring and perivascular pigment clumping / bone spicules OU  - VA remains fairly good (20/20 OD, 20/40 OS with exudative ARMD OS)  - denies night blindness  - denies family history of vision problems  - discussed findings  - broad differential for pigmentary retinopathy:  Congenital (RP), infectious (TB, lyme dx, syphilis, toxo, bartonella), autoimmune and toxic etiologies  - labs checked (4.2.21):  All essential WNL   Rheumatoid factor   CBC   Comprehensive metabolic panel     RPR   ANA   ESR, CRP   ACE   ANCA   Toxoplasma antibodies IgG / IgM   Quantiferon - TB Gold Plus   HLA, A, B, C (IR)   Fluorescent treponemal ab (fla) - IgG - bld   VDRL Serum   Bartonella antibody, 06.04.21  - [facility] -- drawn on 6.4.21                         Results Positive for AGBL5, ALMS1, OPA1 -- unknown significance              - monitor  4,5. Hypertensive retinopathy OU  - discussed importance of tight BP control  - continue to monitor  6. Mixed form age related cataracts OU  - The symptoms of cataract, surgical options, and treatments and risks were discussed with patient.  - discussed diagnosis and progression  - not yet visually significant  - continue to monitor  7. Dry eyes OU - recommend artificial tears and lubricating ointment as needed   Ophthalmic Meds Ordered this visit:  Meds ordered this encounter  Medications   faricimab-svoa (VABYSMO) 6mg /0.38mL intravitreal injection     Return in about 7 weeks (around 01/15/2023) for f/u exu ARMD OS, DFE, OCT.  There are no Patient Instructions on file for this visit. This document serves as a record of services personally performed by Karie Chimera, MD, PhD. It was created on their behalf by Gerilyn Nestle, COT an ophthalmic technician. The creation of this record is the provider's dictation and/or activities during the visit.    Electronically signed by:   Charlette Caffey, COT  11/27/22 8:53 AM  This document serves as a record of services personally performed by Karie Chimera, MD, PhD. It was created on their behalf by Glee Arvin. Manson Passey, OA an ophthalmic technician. The creation of this record is the provider's dictation and/or activities during the visit.    Electronically signed by: Glee Arvin. Manson Passey, OA 11/27/22 8:53 AM  Karie Chimera, M.D., Ph.D. Diseases & Surgery of the Retina and Vitreous [facility]  I have reviewed the above documentation for accuracy and completeness, and I agree with the above. Karie Chimera, M.D., Ph.D. 11/27/22 8:53 AM   Abbreviations: M myopia (nearsighted); A astigmatism; H hyperopia (farsighted); P presbyopia; Mrx spectacle prescription;  CTL contact lenses; OD right eye; OS left eye; OU both eyes  XT exotropia; ET esotropia; PEK punctate epithelial keratitis; PEE punctate epithelial erosions; DES dry eye syndrome; MGD meibomian gland dysfunction; ATs  artificial tears; PFAT's preservative free artificial tears; NSC nuclear sclerotic cataract; PSC posterior subcapsular cataract; ERM epi-retinal membrane; PVD posterior vitreous detachment; RD retinal detachment; DM diabetes mellitus; DR diabetic retinopathy; NPDR non-proliferative diabetic retinopathy; PDR proliferative diabetic retinopathy; CSME clinically significant macular edema; DME diabetic macular edema; dbh dot blot hemorrhages; CWS cotton wool spot; POAG primary open angle glaucoma; C/D cup-to-disc ratio; HVF humphrey visual field; GVF goldmann visual field; OCT optical coherence tomography; IOP intraocular pressure; BRVO Branch retinal vein occlusion; CRVO central retinal vein occlusion; CRAO central retinal artery occlusion; BRAO branch retinal artery occlusion; RT retinal tear; SB scleral buckle; PPV pars plana vitrectomy; VH Vitreous hemorrhage; PRP panretinal laser photocoagulation; IVK intravitreal kenalog; VMT vitreomacular  traction; MH Macular hole;  NVD neovascularization of the disc; NVE neovascularization elsewhere; AREDS age related eye disease study; ARMD age related macular degeneration; POAG primary open angle glaucoma; EBMD epithelial/anterior basement membrane dystrophy; ACIOL anterior chamber intraocular lens; IOL intraocular lens; PCIOL posterior chamber intraocular lens; Phaco/IOL phacoemulsification with intraocular lens placement; PRK photorefractive keratectomy; LASIK laser assisted in situ keratomileusis; HTN hypertension; DM diabetes mellitus; COPD chronic obstructive pulmonary disease

## 2022-11-27 ENCOUNTER — Ambulatory Visit (INDEPENDENT_AMBULATORY_CARE_PROVIDER_SITE_OTHER): Payer: Medicare PPO | Admitting: Ophthalmology

## 2022-11-27 ENCOUNTER — Encounter (INDEPENDENT_AMBULATORY_CARE_PROVIDER_SITE_OTHER): Payer: Self-pay | Admitting: Ophthalmology

## 2022-11-27 DIAGNOSIS — H35033 Hypertensive retinopathy, bilateral: Secondary | ICD-10-CM

## 2022-11-27 DIAGNOSIS — H25813 Combined forms of age-related cataract, bilateral: Secondary | ICD-10-CM

## 2022-11-27 DIAGNOSIS — H3552 Pigmentary retinal dystrophy: Secondary | ICD-10-CM | POA: Diagnosis not present

## 2022-11-27 DIAGNOSIS — H353221 Exudative age-related macular degeneration, left eye, with active choroidal neovascularization: Secondary | ICD-10-CM | POA: Diagnosis not present

## 2022-11-27 DIAGNOSIS — H04123 Dry eye syndrome of bilateral lacrimal glands: Secondary | ICD-10-CM

## 2022-11-27 DIAGNOSIS — H353112 Nonexudative age-related macular degeneration, right eye, intermediate dry stage: Secondary | ICD-10-CM | POA: Diagnosis not present

## 2022-11-27 DIAGNOSIS — I10 Essential (primary) hypertension: Secondary | ICD-10-CM

## 2022-11-27 MED ORDER — FARICIMAB-SVOA 6 MG/0.05ML IZ SOLN
6.0000 mg | INTRAVITREAL | Status: AC | PRN
Start: 2022-11-27 — End: 2022-11-27
  Administered 2022-11-27: 6 mg via INTRAVITREAL

## 2022-12-03 ENCOUNTER — Other Ambulatory Visit: Payer: Self-pay | Admitting: Family Medicine

## 2022-12-03 DIAGNOSIS — E7800 Pure hypercholesterolemia, unspecified: Secondary | ICD-10-CM

## 2023-01-03 NOTE — Progress Notes (Signed)
[facility] - Clinic Note  01/15/2023    CHIEF COMPLAINT Patient presents for Retina Follow Up  HISTORY OF PRESENT ILLNESS: Eric Key is a 67 y.o. male who presents to the clinic today for:   HPI     Retina Follow Up   Patient presents with  Wet AMD.  In left eye.  Severity is moderate.  Duration of 7 weeks.  Since onset it is stable.  I, the attending physician,  performed the HPI with the patient and updated documentation appropriately.        Comments   Pt here for 7 wk ret f/u exu ARMD OS. Pt states VA is the same, no changes. Not currently using any eye gtts.       Last edited by Rennis Chris, MD on 01/15/2023  8:52 AM.    Pt feels like he can't see as well out of his left eye  Referring physician: [name], [name], MD [address] [place],  [place] [address]  HISTORICAL INFORMATION:   Selected notes from the MEDICAL RECORD NUMBER Referred by Dr. Ginette Otto for concern of mac heme OS.   CURRENT MEDICATIONS: No current outpatient medications on file. (Ophthalmic Drugs)   No current facility-administered medications for this visit. (Ophthalmic Drugs)   Current Outpatient Medications (Other)  Medication Sig   buPROPion (ZYBAN) 150 MG 12 hr tablet Take 1 tablet (150 mg total) by mouth 2 (two) times daily.   meloxicam (MOBIC) 15 MG tablet Take 15 mg by mouth daily.   rosuvastatin (CRESTOR) 5 MG tablet Take 1 tablet by mouth once daily   No current facility-administered medications for this visit. (Other)   REVIEW OF SYSTEMS: ROS   Positive for: Eyes Negative for: Constitutional, Gastrointestinal, Neurological, Skin, Genitourinary, Musculoskeletal, HENT, Endocrine, Cardiovascular, Respiratory, Psychiatric, Allergic/Imm, Heme/Lymph Last edited by Thompson Grayer, COT on 01/15/2023  7:46 AM.        ALLERGIES Allergies  Allergen Reactions   Penicillins Hives   PAST MEDICAL HISTORY Past Medical History:  Diagnosis Date    Cataract    Mixed form OU   History of adenomatous polyps of colon 03/16/2020   Hypertension    past hx    Hypertensive retinopathy    OU   Macular degeneration    Dry OD; Wet OS   Past Surgical History:  Procedure Laterality Date   ACHILLES TENDON REPAIR  2010   CHOLECYSTECTOMY     COLONOSCOPY     GALLBLADDER SURGERY     FAMILY HISTORY Family History  Problem Relation Age of Onset   Diabetes Brother    Heart disease Brother    Diabetes Maternal Grandfather    Colon polyps Neg Hx    Colon cancer Neg Hx    Esophageal cancer Neg Hx    Rectal cancer Neg Hx    Stomach cancer Neg Hx    SOCIAL HISTORY Social History   Tobacco Use   Smoking status: Some Days    Current packs/day: 0.00    Types: Cigarettes, Cigars    Last attempt to quit: 02/05/2020    Years since quitting: 2.9   Smokeless tobacco: Never  Vaping Use   Vaping status: Never Used  Substance Use Topics   Alcohol use: Yes    Comment: occ   Drug use: Never       OPHTHALMIC EXAM: Base Eye Exam     Visual Acuity (Snellen - Linear)  Right Left   Dist cc 20/25 -2 20/40 -3   Dist ph cc NI 20/40 -2    Correction: Glasses         Tonometry (Tonopen, 7:52 AM)       Right Left   Pressure 11 13         Pupils       Pupils Dark Light Shape React APD   Right PERRL 3 2 Round Brisk None   Left PERRL 3 2 Round Brisk None         Visual Fields (Counting fingers)       Left Right    Full Full         Extraocular Movement       Right Left    Full, Ortho Full, Ortho         Neuro/Psych     Oriented x3: Yes   Mood/Affect: Normal         Dilation     Both eyes: 1.0% Mydriacyl, 2.5% Phenylephrine @ 7:52 AM           Slit Lamp and Fundus Exam     Slit Lamp Exam       Right Left   Lids/Lashes Dermatochalasis - upper lid Dermatochalasis - upper lid   Conjunctiva/Sclera White and quiet White and quiet   Cornea 2+ PEE, mild Arcus, tear film debris mild Arcus, 1-2+  inferior punctate epithelial erosions, tear film debris, 2.14mm epi defect at 0230 limbus -- closed   Anterior Chamber deep, clear, narrow angles deep, clear, narrow angles   Iris Round and dilated, mild anterior bowing Round and moderately dilated, mild anterior bowing   Lens 2-3+ Nuclear sclerosis, 2-3+ Cortical cataract 2-3+ Nuclear sclerosis, 2-3+ Cortical cataract, 2+ Posterior subcapsular cataract   Anterior Vitreous Vitreous syneresis Vitreous syneresis         Fundus Exam       Right Left   Disc Pink and Sharp, focal PPP ST, Compact Pink and Sharp, Compact   C/D Ratio 0.2 0.4   Macula Flat, Drusen, RPE mottling and clumping, early Atrophy, punctate IRH -- fading Hazy view, abnormal foveal reflex, +CNV with heme stably improved, +central edema / cystic changes -- stably improved, Drusen, RPE mottling and clumping, early subretinal fibrosis, punctate IRH - improved   Vessels attenuated, tortuous, perivascular pigment clumping peripherally, mild AV crossing changes attenuated, mild tortuousity, perivascular pigment clumping peripherally   Periphery Attached, peripheral pigmented CR scarring / atrophy and perivascular bone spicules 360, No heme Attached, peripheral pigmented CR scarring / atrophy and perivascular bone spicules 360, No heme           Refraction     Wearing Rx       Sphere Cylinder Axis Add   Right +0.25 +1.50 003 +2.00   Left +0.25 +0.75 169 +2.00           IMAGING AND PROCEDURES  Imaging and Procedures for @TODAY @  OCT, Retina - OU - Both Eyes       Right Eye Quality was good. Central Foveal Thickness: 279. Progression has been stable. Findings include normal foveal contour, no IRF, no SRF, retinal drusen , pigment epithelial detachment, outer retinal atrophy (Persistent vitreous opacities; stable low lying PEDs and ORA, partial PVD).   Left Eye Quality was good. Central Foveal Thickness: 287. Progression has been stable. Findings include abnormal  foveal contour, retinal drusen , subretinal hyper-reflective material, intraretinal fluid, pigment epithelial detachment, outer retinal atrophy (stable improvement  in IRF overlying stable low-lying PED / [facility] temporal fovea and macula ).   Notes *Images captured and stored on drive  Diagnosis / Impression:  OD: non-exu ARMD w/ stable low lying PEDs and ORA OS: exu ARMD -- stable improvement in IRF overlying stable low-lying PED / [facility] temporal fovea and macula -- no fluid  Clinical management:  See below  Abbreviations: NFP - Normal foveal profile. CME - cystoid macular edema. PED - pigment epithelial detachment. IRF - intraretinal fluid. SRF - subretinal fluid. EZ - ellipsoid zone. ERM - epiretinal membrane. ORA - outer retinal atrophy. ORT - outer retinal tubulation. SRHM - subretinal hyper-reflective material      Intravitreal Injection, Pharmacologic Agent - OS - Left Eye       Time Out 01/15/2023. 7:51 AM. Confirmed correct patient, procedure, site, and patient consented.   Anesthesia Topical anesthesia was used. Anesthetic medications included Lidocaine 2%, Proparacaine 0.5%.   Procedure Preparation included 5% betadine to ocular surface, eyelid speculum. A supplied (32g) needle was used.   Injection: 6 mg faricimab-svoa 6 MG/0.05ML   Route: Intravitreal, Site: Left Eye   NDC: 16109-604-54, Lot: [id number], Expiration date: 01/14/2024, Waste: 0 mL   Post-op Post injection exam found visual acuity of at least counting fingers. The patient tolerated the procedure well. There were no complications. The patient received written and verbal post procedure care education. Post injection medications were not given.            ASSESSMENT/PLAN:   ICD-10-CM   1. Exudative age-related macular degeneration of left eye with active choroidal neovascularization (HCC)  H35.3221 OCT, Retina - OU - Both Eyes    Intravitreal Injection, Pharmacologic Agent - OS - Left Eye    faricimab-svoa  (VABYSMO) 6mg /0.70mL intravitreal injection    2. 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 Essential hypertension  I10     5. Hypertensive retinopathy of both eyes  H35.033     6. Combined forms of age-related cataract of both eyes  H25.813     7. Dry eyes  H04.123      1. 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, OS  - s/p IVA OS #1 (03.04.21), #2 (03.04.21), #3 (04.30.21) -- IVA resistance  - s/p IVE OS #1 (06.04.21), #2 (07.08.21), #3 (08.09.21), #4 (09.13.21), #5 (10.18.21), #6 (11.24.21), #7 (12.31.21), #8 (02.11.22), #9 (03.25.22), #10 (05.02.22), #11 (06.06.22), #12 (07.13.22), #13 (08.17.22), #14 (09.21.22), #15 (10.26.22), #16 (11.29.22), #17 (01.03.23), #18 (02.07.23), #19 (03.16.23), #20 (04.20.23), #21 (05.19.23), #22 (06.20.23) -- IVE resistance  - s/p IVV OS #1 (07.18.23), #2 (08.15.23), #3 (09.12.23), #4 (10.10.23), #5 (11.14.23), #6 (12.21.23), #7 (01.25.24), #8 (02.29.24), #9 (04.11.24), #10 (05.20.24), #11 (07.01.24), #12 (08.13.24)  - pt reports stable vision OS  - exam shows central CNV with +subretinal heme -- improved  - BCVA 20/40 OS -- stable  - OCT shows stable improvement in IRF overlying stable low-lying PED / [facility] temporal fovea and macula at 7 weeks -- no fluid  - recommend IVV OS #13 today, 10.01.24, w/ f/u ext to 8 wks  - pt wishes to proceed  - RBA of procedure discussed, questions answered  - IVV informed consent obtained and signed, 10.01.24  - see procedure note  - f/u in 8 wks -- DFE/OCT, possible injection  2. Age related macular degeneration, non-exudative, OD  - The incidence, anatomy, and pathology of dry AMD, risk  of progression, and the AREDS and AREDS 2 study including smoking risks discussed with patient.   - recommend Amsler grid monitoring  3. Pigmentary retinopathy OU  - peripheral pigmented CR scarring and perivascular pigment clumping / bone  spicules OU  - VA remains fairly good (20/20 OD, 20/40 OS with exudative ARMD OS)  - denies night blindness  - denies family history of vision problems  - discussed findings  - broad differential for pigmentary retinopathy:  Congenital (RP), infectious (TB, lyme dx, syphilis, toxo, bartonella), autoimmune and toxic etiologies  - labs checked (4.2.21):  All essential WNL   Rheumatoid factor   CBC   Comprehensive metabolic panel     RPR   ANA   ESR, CRP   ACE   ANCA   Toxoplasma antibodies IgG / IgM   Quantiferon - TB Gold Plus   HLA, A, B, C (IR)   Fluorescent treponemal ab (fla) - IgG - bld   VDRL Serum   Bartonella antibody, 06.04.21  - [facility] -- drawn on 6.4.21                         Results Positive for AGBL5, ALMS1, OPA1 -- unknown significance              - monitor  4,5. Hypertensive retinopathy OU  - discussed importance of tight BP control  - continue to monitor  6. Mixed form age related cataracts OU  - The symptoms of cataract, surgical options, and treatments and risks were discussed with patient.  - discussed diagnosis and progression  - will refer to Dr. Cathey Endow for consult  7. Dry eyes OU - recommend artificial tears and lubricating ointment as needed   Ophthalmic Meds Ordered this visit:  Meds ordered this encounter  Medications   faricimab-svoa (VABYSMO) 6mg /0.61mL intravitreal injection     Return in about 8 weeks (around 03/12/2023) for f/u exu ARMD OS, DFE, OCT.  There are no Patient Instructions on file for this visit. This document serves as a record of services personally performed by Karie Chimera, MD, PhD. It was created on their behalf by Charlette Caffey, COT an ophthalmic technician. The creation of this record is the provider's dictation and/or activities during the visit.    Electronically signed by:  Charlette Caffey, COT  01/15/23 8:54 AM  This document serves as a record of services personally performed by Karie Chimera, MD, PhD. It was created on their behalf by Glee Arvin. Manson Passey, OA an ophthalmic technician. The creation of this record is the provider's dictation and/or activities during the visit.    Electronically signed by: Glee Arvin. Manson Passey, OA 01/15/23 8:54 AM  Karie Chimera, M.D., Ph.D. Diseases & Surgery of the Retina and Vitreous [facility] [place]  I have reviewed the above documentation for accuracy and completeness, and I agree with the above. Karie Chimera, M.D., Ph.D. 01/15/23 8:55 AM  Abbreviations: M myopia (nearsighted); A astigmatism; H hyperopia (farsighted); P presbyopia; Mrx spectacle prescription;  CTL contact lenses; OD right eye; OS left eye; OU both eyes  XT exotropia; ET esotropia; PEK punctate epithelial keratitis; PEE punctate epithelial erosions; DES dry eye syndrome; MGD meibomian gland dysfunction; ATs artificial tears; PFAT's preservative free artificial tears; NSC nuclear sclerotic cataract; PSC posterior subcapsular cataract; ERM epi-retinal membrane; PVD posterior vitreous detachment; RD retinal detachment; DM diabetes mellitus; DR diabetic retinopathy; NPDR non-proliferative diabetic retinopathy; PDR  proliferative diabetic retinopathy; CSME clinically significant macular edema; DME diabetic macular edema; dbh dot blot hemorrhages; CWS cotton wool spot; POAG primary open angle glaucoma; C/D cup-to-disc ratio; HVF humphrey visual field; GVF goldmann visual field; OCT optical coherence tomography; IOP intraocular pressure; BRVO Branch retinal vein occlusion; CRVO central retinal vein occlusion; CRAO central retinal artery occlusion; BRAO branch retinal artery occlusion; RT retinal tear; SB scleral buckle; PPV pars plana vitrectomy; VH Vitreous hemorrhage; PRP panretinal laser photocoagulation; IVK intravitreal kenalog; VMT vitreomacular traction; MH Macular hole;  NVD neovascularization of the disc; NVE neovascularization elsewhere; AREDS age related eye disease  study; ARMD age related macular degeneration; POAG primary open angle glaucoma; EBMD epithelial/anterior basement membrane dystrophy; ACIOL anterior chamber intraocular lens; IOL intraocular lens; PCIOL posterior chamber intraocular lens; Phaco/IOL phacoemulsification with intraocular lens placement; PRK photorefractive keratectomy; LASIK laser assisted in situ keratomileusis; HTN hypertension; DM diabetes mellitus; COPD chronic obstructive pulmonary disease

## 2023-01-15 ENCOUNTER — Encounter (INDEPENDENT_AMBULATORY_CARE_PROVIDER_SITE_OTHER): Payer: Self-pay | Admitting: Ophthalmology

## 2023-01-15 ENCOUNTER — Ambulatory Visit (INDEPENDENT_AMBULATORY_CARE_PROVIDER_SITE_OTHER): Payer: Medicare PPO | Admitting: Ophthalmology

## 2023-01-15 DIAGNOSIS — H04123 Dry eye syndrome of bilateral lacrimal glands: Secondary | ICD-10-CM

## 2023-01-15 DIAGNOSIS — I10 Essential (primary) hypertension: Secondary | ICD-10-CM | POA: Diagnosis not present

## 2023-01-15 DIAGNOSIS — H353112 Nonexudative age-related macular degeneration, right eye, intermediate dry stage: Secondary | ICD-10-CM | POA: Diagnosis not present

## 2023-01-15 DIAGNOSIS — H25813 Combined forms of age-related cataract, bilateral: Secondary | ICD-10-CM

## 2023-01-15 DIAGNOSIS — H353221 Exudative age-related macular degeneration, left eye, with active choroidal neovascularization: Secondary | ICD-10-CM | POA: Diagnosis not present

## 2023-01-15 DIAGNOSIS — H3552 Pigmentary retinal dystrophy: Secondary | ICD-10-CM | POA: Diagnosis not present

## 2023-01-15 DIAGNOSIS — H35033 Hypertensive retinopathy, bilateral: Secondary | ICD-10-CM

## 2023-01-15 MED ORDER — FARICIMAB-SVOA 6 MG/0.05ML IZ SOSY
6.0000 mg | PREFILLED_SYRINGE | INTRAVITREAL | Status: AC | PRN
Start: 2023-01-15 — End: 2023-01-15
  Administered 2023-01-15: 6 mg via INTRAVITREAL

## 2023-01-18 ENCOUNTER — Encounter: Payer: Self-pay | Admitting: Family Medicine

## 2023-01-18 ENCOUNTER — Ambulatory Visit (INDEPENDENT_AMBULATORY_CARE_PROVIDER_SITE_OTHER): Payer: Medicare PPO | Admitting: Family Medicine

## 2023-01-18 VITALS — BP 147/78 | HR 102 | Ht 73.0 in | Wt 249.0 lb

## 2023-01-18 DIAGNOSIS — E7800 Pure hypercholesterolemia, unspecified: Secondary | ICD-10-CM

## 2023-01-18 DIAGNOSIS — F17210 Nicotine dependence, cigarettes, uncomplicated: Secondary | ICD-10-CM

## 2023-01-18 DIAGNOSIS — Z0001 Encounter for general adult medical examination with abnormal findings: Secondary | ICD-10-CM | POA: Diagnosis not present

## 2023-01-18 DIAGNOSIS — Z0000 Encounter for general adult medical examination without abnormal findings: Secondary | ICD-10-CM

## 2023-01-18 DIAGNOSIS — Z125 Encounter for screening for malignant neoplasm of prostate: Secondary | ICD-10-CM | POA: Diagnosis not present

## 2023-01-18 DIAGNOSIS — Z716 Tobacco abuse counseling: Secondary | ICD-10-CM | POA: Diagnosis not present

## 2023-01-18 MED ORDER — BUPROPION HCL ER (SMOKING DET) 150 MG PO TB12
150.0000 mg | ORAL_TABLET | Freq: Two times a day (BID) | ORAL | 3 refills | Status: DC
Start: 2023-01-18 — End: 2023-05-15

## 2023-01-18 MED ORDER — ROSUVASTATIN CALCIUM 5 MG PO TABS
5.0000 mg | ORAL_TABLET | Freq: Every day | ORAL | 3 refills | Status: DC
Start: 2023-01-18 — End: 2023-08-16

## 2023-01-18 NOTE — Progress Notes (Signed)
BP (!) 147/78   Pulse (!) 102   Ht 6\' 1"  (1.854 m)   Wt 249 lb (112.9 kg)   SpO2 95%   BMI 32.85 kg/m    Subjective:   [name], male    DOB: [birth year], 67 y.o.   MRN: [id number]  HPI: Eric Key is a 67 y.o. male presenting on 01/18/2023 for Medical Management of Chronic Issues (CPE)   HPI Physical exam Patient denies any chest pain, shortness of breath, headaches or vision issues, abdominal complaints, diarrhea, nausea, vomiting, or joint issues.   Hyperlipidemia Patient is coming in for recheck of his hyperlipidemia. The patient is currently taking Crestor. They deny any issues with myalgias or history of liver damage from it. They deny any focal numbness or weakness or chest pain.   Smoking cessation He did try the Zyban before and it did help him quit but then he stopped it the week after he quit he got very irritable and that lasted for couple months and then he restarted smoking.  He wants to quit again but this time we discussed doing Zyban but keeping it going for couple months after he quits so he can still get the benefit from it.  Relevant past medical, surgical, family and social history reviewed and updated as indicated. Interim medical history since our last visit reviewed. Allergies and medications reviewed and updated.  Review of Systems  Constitutional:  Negative for chills and fever.  HENT:  Negative for ear pain and tinnitus.   Eyes:  Negative for pain and visual disturbance.  Respiratory:  Negative for cough, shortness of breath and wheezing.   Cardiovascular:  Negative for chest pain, palpitations and leg swelling.  Gastrointestinal:  Negative for abdominal pain, blood in stool, constipation and diarrhea.  Genitourinary:  Negative for dysuria and hematuria.  Musculoskeletal:  Negative for back pain, gait problem and myalgias.  Skin:  Negative for rash.  Neurological:  Negative for dizziness, weakness and headaches.   Psychiatric/Behavioral:  Negative for suicidal ideas.   All other systems reviewed and are negative.   Per HPI unless specifically indicated above   Allergies as of 01/18/2023       Reactions   Penicillins Hives        Medication List        Accurate as of January 18, 2023  3:21 PM. If you have any questions, ask your nurse or doctor.          STOP taking these medications    meloxicam 15 MG tablet Commonly known as: MOBIC Stopped by: [name] Dao Memmott       TAKE these medications    buPROPion 150 MG 12 hr tablet Commonly known as: Zyban Take 1 tablet (150 mg total) by mouth 2 (two) times daily.   rosuvastatin 5 MG tablet Commonly known as: CRESTOR Take 1 tablet (5 mg total) by mouth daily.         Objective:   BP (!) 147/78   Pulse (!) 102   Ht 6\' 1"  (1.854 m)   Wt 249 lb (112.9 kg)   SpO2 95%   BMI 32.85 kg/m   Wt Readings from Last 3 Encounters:  01/18/23 249 lb (112.9 kg)  12/28/21 255 lb (115.7 kg)  12/20/21 253 lb (114.8 kg)    Physical Exam Vitals and nursing note reviewed.  Constitutional:      General: He is not in acute distress.    Appearance: He is  well-developed. He is not diaphoretic.  HENT:     Right Ear: External ear normal.     Left Ear: External ear normal.     Nose: Nose normal.     Mouth/Throat:     Pharynx: No oropharyngeal exudate.  Eyes:     General: No scleral icterus.       Right eye: No discharge.     Conjunctiva/sclera: Conjunctivae normal.     Pupils: Pupils are equal, round, and reactive to light.  Neck:     Thyroid: No thyromegaly.  Cardiovascular:     Rate and Rhythm: Normal rate and regular rhythm.     Heart sounds: Normal heart sounds. No murmur heard. Pulmonary:     Effort: Pulmonary effort is normal. No respiratory distress.     Breath sounds: Normal breath sounds. No wheezing.  Abdominal:     General: Bowel sounds are normal. There is no distension.     Palpations: Abdomen is soft.      Tenderness: There is no abdominal tenderness. There is no guarding or rebound.  Musculoskeletal:        General: Normal range of motion.     Cervical back: Neck supple.  Lymphadenopathy:     Cervical: No cervical adenopathy.  Skin:    General: Skin is warm and dry.     Findings: No rash.  Neurological:     Mental Status: He is alert and oriented to person, place, and time.     Coordination: Coordination normal.  Psychiatric:        Behavior: Behavior normal.       Assessment & Plan:   Problem List Items Addressed This Visit       Other   Pure hypercholesterolemia   Relevant Medications   rosuvastatin (CRESTOR) 5 MG tablet   Other Relevant Orders   CBC with Differential/Platelet   CMP14+EGFR   Lipid panel   Other Visit Diagnoses     Physical exam    -  Primary   Relevant Orders   CBC with Differential/Platelet   CMP14+EGFR   Lipid panel   Encounter for smoking cessation counseling       Relevant Medications   buPROPion (ZYBAN) 150 MG 12 hr tablet   Prostate cancer screening       Relevant Orders   PSA, total and free   Smokes less than 1 pack a day with greater than 20 pack year history       Relevant Orders   Ambulatory Referral Lung Cancer Screening [facility]       Will do lung cancer screening and will restart the Zyban to help him quit smoking and then keep it going a little bit longer so he does not get irritable and can stay off the cigarettes.  Continue Crestor, taking about 3 times per day and will do blood work today. Follow up plan: Return in about 1 year (around 01/18/2024), or if symptoms worsen or fail to improve, for Physical and cholesterol recheck.  Counseling provided for all of the vaccine components Orders Placed This Encounter  Procedures   PSA, total and free   CBC with Differential/Platelet   CMP14+EGFR   Lipid panel   Ambulatory Referral Lung Cancer Screening [facility]    Arville Care, MD [facility]  Family Medicine 01/18/2023, 3:21 PM

## 2023-01-19 LAB — CBC WITH DIFFERENTIAL/PLATELET
Basophils Absolute: 0.1 10*3/uL (ref 0.0–0.2)
Basos: 1 %
EOS (ABSOLUTE): 0.1 10*3/uL (ref 0.0–0.4)
Eos: 2 %
Hematocrit: 49.3 % (ref 37.5–51.0)
Hemoglobin: 15.9 g/dL (ref 13.0–17.7)
Immature Grans (Abs): 0 10*3/uL (ref 0.0–0.1)
Immature Granulocytes: 0 %
Lymphocytes Absolute: 3.1 10*3/uL (ref 0.7–3.1)
Lymphs: 36 %
MCH: 29.9 pg (ref 26.6–33.0)
MCHC: 32.3 g/dL (ref 31.5–35.7)
MCV: 93 fL (ref 79–97)
Monocytes Absolute: 0.6 10*3/uL (ref 0.1–0.9)
Monocytes: 7 %
Neutrophils Absolute: 4.7 10*3/uL (ref 1.4–7.0)
Neutrophils: 54 %
Platelets: 142 10*3/uL — ABNORMAL LOW (ref 150–450)
RBC: 5.31 x10E6/uL (ref 4.14–5.80)
RDW: 11.5 % — ABNORMAL LOW (ref 11.6–15.4)
WBC: 8.5 10*3/uL (ref 3.4–10.8)

## 2023-01-19 LAB — CMP14+EGFR
ALT: 13 [IU]/L (ref 0–44)
AST: 16 [IU]/L (ref 0–40)
Albumin: 4.1 g/dL (ref 3.9–4.9)
Alkaline Phosphatase: 83 [IU]/L (ref 44–121)
BUN/Creatinine Ratio: 15 (ref 10–24)
BUN: 16 mg/dL (ref 8–27)
Bilirubin Total: 0.3 mg/dL (ref 0.0–1.2)
CO2: 21 mmol/L (ref 20–29)
Calcium: 9.3 mg/dL (ref 8.6–10.2)
Chloride: 96 mmol/L (ref 96–106)
Creatinine, Ser: 1.09 mg/dL (ref 0.76–1.27)
Globulin, Total: 3.4 g/dL (ref 1.5–4.5)
Glucose: 317 mg/dL — ABNORMAL HIGH (ref 70–99)
Potassium: 4.2 mmol/L (ref 3.5–5.2)
Sodium: 132 mmol/L — ABNORMAL LOW (ref 134–144)
Total Protein: 7.5 g/dL (ref 6.0–8.5)
eGFR: 75 mL/min/{1.73_m2} (ref >59)

## 2023-01-19 LAB — LIPID PANEL
Chol/HDL Ratio: 5.5 {ratio} — ABNORMAL HIGH (ref 0.0–5.0)
Cholesterol, Total: 202 mg/dL — ABNORMAL HIGH (ref 100–199)
HDL: 37 mg/dL — ABNORMAL LOW (ref >39)
LDL Chol Calc (NIH): 110 mg/dL — ABNORMAL HIGH (ref 0–99)
Triglycerides: 320 mg/dL — ABNORMAL HIGH (ref 0–149)
VLDL Cholesterol Cal: 55 mg/dL — ABNORMAL HIGH (ref 5–40)

## 2023-01-19 LAB — PSA, TOTAL AND FREE
PSA, Free Pct: 22.5 %
PSA, Free: 0.27 ng/mL
Prostate Specific Ag, Serum: 1.2 ng/mL (ref 0.0–4.0)

## 2023-01-23 ENCOUNTER — Ambulatory Visit (INDEPENDENT_AMBULATORY_CARE_PROVIDER_SITE_OTHER): Payer: Medicare PPO | Admitting: Ophthalmology

## 2023-01-23 ENCOUNTER — Encounter (INDEPENDENT_AMBULATORY_CARE_PROVIDER_SITE_OTHER): Payer: Self-pay | Admitting: Ophthalmology

## 2023-01-23 DIAGNOSIS — H353112 Nonexudative age-related macular degeneration, right eye, intermediate dry stage: Secondary | ICD-10-CM | POA: Diagnosis not present

## 2023-01-23 DIAGNOSIS — I10 Essential (primary) hypertension: Secondary | ICD-10-CM | POA: Diagnosis not present

## 2023-01-23 DIAGNOSIS — H3552 Pigmentary retinal dystrophy: Secondary | ICD-10-CM | POA: Diagnosis not present

## 2023-01-23 DIAGNOSIS — H353221 Exudative age-related macular degeneration, left eye, with active choroidal neovascularization: Secondary | ICD-10-CM | POA: Diagnosis not present

## 2023-01-23 DIAGNOSIS — H35033 Hypertensive retinopathy, bilateral: Secondary | ICD-10-CM

## 2023-01-23 DIAGNOSIS — H04123 Dry eye syndrome of bilateral lacrimal glands: Secondary | ICD-10-CM

## 2023-01-23 DIAGNOSIS — H25813 Combined forms of age-related cataract, bilateral: Secondary | ICD-10-CM

## 2023-01-23 MED ORDER — OFLOXACIN 0.3 % OP SOLN: 1.0000 [drp] | Freq: Four times a day (QID) | OPHTHALMIC | 0 refills | Status: DC

## 2023-01-23 MED ORDER — PREDNISOLONE ACETATE 1 % OP SUSP: 1.0000 [drp] | Freq: Four times a day (QID) | OPHTHALMIC | 0 refills | Status: DC

## 2023-01-23 NOTE — Progress Notes (Signed)
[facility] - Clinic Note  01/23/2023    CHIEF COMPLAINT Patient presents for Retina Evaluation  HISTORY OF PRESENT ILLNESS: Eric Key is a 67 y.o. male who presents to the clinic today for:   HPI     Retina Evaluation   In left eye.  This started 3 days ago.  Associated Symptoms Pain and Redness.  I, the attending physician,  performed the HPI with the patient and updated documentation appropriately.        Comments   Patient here for 3 days Retina Evaluation. Patient had injection 01-15-23. Started Sunday having pain and redness. Vision is bad. OD is fine. Patient was rubbing OS.       Last edited by Rennis Chris, MD on 01/23/2023  8:11 AM.    Pt states his left eye started to hurt Sunday, he also has decreased VA in that eye, he has been rubbing his eye a lot  Referring physician: [name], [name], MD [address] [place],  [place] [address]  HISTORICAL INFORMATION:   Selected notes from the MEDICAL RECORD NUMBER Referred by Dr. Ginette Otto for concern of mac heme OS.   CURRENT MEDICATIONS: Current Outpatient Medications (Ophthalmic Drugs)  Medication Sig   ofloxacin (OCUFLOX) 0.3 % ophthalmic solution Place 1 drop into the left eye 4 (four) times daily.   prednisoLONE acetate (PRED FORTE) 1 % ophthalmic suspension Place 1 drop into the left eye 4 (four) times daily.   No current facility-administered medications for this visit. (Ophthalmic Drugs)   Current Outpatient Medications (Other)  Medication Sig   buPROPion (ZYBAN) 150 MG 12 hr tablet Take 1 tablet (150 mg total) by mouth 2 (two) times daily.   rosuvastatin (CRESTOR) 5 MG tablet Take 1 tablet (5 mg total) by mouth daily.   No current facility-administered medications for this visit. (Other)   REVIEW OF SYSTEMS: ROS   Positive for: Eyes Negative for: Constitutional, Gastrointestinal, Neurological, Skin, Genitourinary, Musculoskeletal, HENT, Endocrine, Cardiovascular,  Respiratory, Psychiatric, Allergic/Imm, Heme/Lymph Last edited by Laddie Aquas, COA on 01/23/2023  7:59 AM.     ALLERGIES Allergies  Allergen Reactions   Penicillins Hives   PAST MEDICAL HISTORY Past Medical History:  Diagnosis Date   Cataract    Mixed form OU   History of adenomatous polyps of colon 03/16/2020   Hypertension    past hx    Hypertensive retinopathy    OU   Macular degeneration    Dry OD; Wet OS   Past Surgical History:  Procedure Laterality Date   ACHILLES TENDON REPAIR  2010   CHOLECYSTECTOMY     COLONOSCOPY     GALLBLADDER SURGERY     FAMILY HISTORY Family History  Problem Relation Age of Onset   Diabetes Brother    Heart disease Brother    Diabetes Maternal Grandfather    Colon polyps Neg Hx    Colon cancer Neg Hx    Esophageal cancer Neg Hx    Rectal cancer Neg Hx    Stomach cancer Neg Hx    SOCIAL HISTORY Social History   Tobacco Use   Smoking status: Some Days    Current packs/day: 0.00    Types: Cigarettes, Cigars    Last attempt to quit: 02/05/2020    Years since quitting: 2.9   Smokeless tobacco: Never  Vaping Use   Vaping status: Never Used  Substance Use Topics   Alcohol use: Yes    Comment: occ  Drug use: Never       OPHTHALMIC EXAM: Base Eye Exam     Visual Acuity (Snellen - Linear)       Right Left   Dist cc 20/20 20/70 -1   Dist ph cc  20/50    Correction: Glasses         Tonometry (Tonopen, 7:56 AM)       Right Left   Pressure 11 10         Pupils       Dark Light Shape React APD   Right 3 2 Round Brisk None   Left 3 2 Round Brisk None         Visual Fields (Counting fingers)       Left Right    Full Full         Extraocular Movement       Right Left    Full, Ortho Full, Ortho         Neuro/Psych     Oriented x3: Yes   Mood/Affect: Normal         Dilation     Left eye: 1.0% Mydriacyl, 2.5% Phenylephrine @ 7:56 AM           Slit Lamp and Fundus Exam     Slit  Lamp Exam       Right Left   Lids/Lashes Dermatochalasis - upper lid Dermatochalasis - upper lid   Conjunctiva/Sclera White and quiet 2-3+ Injection temporal   Cornea 2+ PEE, mild Arcus, tear film debris mild Arcus, 3+ inferior punctate epithelial erosions, early band K IT limbus, tear film debris   Anterior Chamber deep, clear, narrow angles deep, clear, narrow angles, No cell or flare, no hypopyon   Iris Round and dilated, mild anterior bowing Round and moderately dilated, mild anterior bowing   Lens 2-3+ Nuclear sclerosis, 2-3+ Cortical cataract 2-3+ Nuclear sclerosis, 2-3+ Cortical cataract, 2+ Posterior subcapsular cataract   Anterior Vitreous Vitreous syneresis Vitreous syneresis         Fundus Exam       Right Left   Disc Pink and Sharp, focal PPP ST, Compact Pink and Sharp, Compact   C/D Ratio 0.2 0.4   Macula Flat, Drusen, RPE mottling and clumping, early Atrophy, punctate IRH -- fading Hazy view, abnormal foveal reflex, +CNV with heme stably improved, +central edema / cystic changes -- stably improved, Drusen, RPE mottling and clumping, early subretinal fibrosis, punctate IRH - improved   Vessels attenuated, tortuous, perivascular pigment clumping peripherally, mild AV crossing changes attenuated, mild tortuousity, perivascular pigment clumping peripherally   Periphery Attached, peripheral pigmented CR scarring / atrophy and perivascular bone spicules 360, No heme Attached, peripheral pigmented CR scarring / atrophy and perivascular bone spicules 360, No heme           Refraction     Wearing Rx       Sphere Cylinder Axis Add   Right +0.25 +1.50 003 +2.00   Left +0.25 +0.75 169 +2.00           IMAGING AND PROCEDURES  Imaging and Procedures for @TODAY @  OCT, Retina - OU - Both Eyes       Right Eye Quality was good. Central Foveal Thickness: 285. Progression has been stable. Findings include normal foveal contour, no IRF, no SRF, retinal drusen , pigment  epithelial detachment, outer retinal atrophy (Persistent vitreous opacities; stable low lying PEDs and ORA, partial PVD).   Left Eye Quality was borderline. Central Foveal  Thickness: 294. Progression has been stable. Findings include abnormal foveal contour, retinal drusen , subretinal hyper-reflective material, intraretinal fluid, pigment epithelial detachment, outer retinal atrophy (stable improvement in IRF overlying stable low-lying PED / [facility] temporal fovea and macula ).   Notes *Images captured and stored on drive  Diagnosis / Impression:  OD: non-exu ARMD w/ stable low lying PEDs and ORA OS: exu ARMD -- stable improvement in IRF overlying stable low-lying PED / [facility] temporal fovea and macula -- no fluid  Clinical management:  See below  Abbreviations: NFP - Normal foveal profile. CME - cystoid macular edema. PED - pigment epithelial detachment. IRF - intraretinal fluid. SRF - subretinal fluid. EZ - ellipsoid zone. ERM - epiretinal membrane. ORA - outer retinal atrophy. ORT - outer retinal tubulation. SRHM - subretinal hyper-reflective material            ASSESSMENT/PLAN:   ICD-10-CM   1. Exudative age-related macular degeneration of left eye with active choroidal neovascularization (HCC)  H35.3221 OCT, Retina - OU - Both Eyes    2. 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 Essential hypertension  I10     5. Hypertensive retinopathy of both eyes  H35.033     6. Combined forms of age-related cataract of both eyes  H25.813     7. Dry eyes  H04.123      **Pt presents acutely for pain, redness and decreased VA OS since Sunday, 10.06.24 - pt underwent IVV OS on 10.01.24  - pt reports frequent eye rubbing - BCVA OS 20/50 from 20/40 - exam shows corneal dryness and mild conjunctivitis OS -- no endophthalmitis / hypopyon  - will start Ofloxacin and PF QID OS  - recommend artificial  tears and avoidance of eye rubbing  - f/u Friday, October 11  1. 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, OS  - s/p IVA OS #1 (03.04.21), #2 (03.04.21), #3 (04.30.21) -- IVA resistance  - s/p IVE OS #1 (06.04.21), #2 (07.08.21), #3 (08.09.21), #4 (09.13.21), #5 (10.18.21), #6 (11.24.21), #7 (12.31.21), #8 (02.11.22), #9 (03.25.22), #10 (05.02.22), #11 (06.06.22), #12 (07.13.22), #13 (08.17.22), #14 (09.21.22), #15 (10.26.22), #16 (11.29.22), #17 (01.03.23), #18 (02.07.23), #19 (03.16.23), #20 (04.20.23), #21 (05.19.23), #22 (06.20.23) -- IVE resistance  - s/p IVV OS #1 (07.18.23), #2 (08.15.23), #3 (09.12.23), #4 (10.10.23), #5 (11.14.23), #6 (12.21.23), #7 (01.25.24), #8 (02.29.24), #9 (04.11.24), #10 (05.20.24), #11 (07.01.24), #12 (08.13.24), #13 (10.01.24)  - exam shows central CNV with +subretinal heme -- improved  - BCVA 20/40 OS -- stable  - OCT shows stable improvement in IRF overlying stable low-lying PED / [facility] temporal fovea and macula -- no fluid  - IVV informed consent obtained and signed, 10.01.24  - f/u November 26th as scheduled -- DFE/OCT, possible injection  2. Age related macular degeneration, non-exudative, OD  - The incidence, anatomy, and pathology of dry AMD, risk of progression, and the AREDS and AREDS 2 study including smoking risks discussed with patient.   - recommend Amsler grid monitoring  3. Pigmentary retinopathy OU  - peripheral pigmented CR scarring and perivascular pigment clumping / bone spicules OU  - VA remains fairly good (20/20 OD, 20/40 OS with exudative ARMD OS)  - denies night blindness  - denies family history of vision problems  - discussed findings  - broad differential for pigmentary retinopathy:  Congenital (RP), infectious (TB, lyme dx, syphilis, toxo, bartonella), autoimmune and toxic  etiologies  - labs checked (4.2.21):  All essential WNL   Rheumatoid factor   CBC   Comprehensive metabolic panel     RPR   ANA   ESR,  CRP   ACE   ANCA   Toxoplasma antibodies IgG / IgM   Quantiferon - TB Gold Plus   HLA, A, B, C (IR)   Fluorescent treponemal ab (fla) - IgG - bld   VDRL Serum   Bartonella antibody, 06.04.21  - [facility] -- drawn on 6.4.21                         Results Positive for AGBL5, ALMS1, OPA1 -- unknown significance              - monitor  4,5. Hypertensive retinopathy OU  - discussed importance of tight BP control  - continue to monitor  6. Mixed form age related cataracts OU  - The symptoms of cataract, surgical options, and treatments and risks were discussed with patient.  - discussed diagnosis and progression  - will refer to Dr. Cathey Endow for consult  7. Dry eyes OU - recommend artificial tears and lubricating ointment as needed   Ophthalmic Meds Ordered this visit:  Meds ordered this encounter  Medications   ofloxacin (OCUFLOX) 0.3 % ophthalmic solution    Sig: Place 1 drop into the left eye 4 (four) times daily.    Dispense:  10 mL    Refill:  0   prednisoLONE acetate (PRED FORTE) 1 % ophthalmic suspension    Sig: Place 1 drop into the left eye 4 (four) times daily.    Dispense:  10 mL    Refill:  0     Return in about 2 days (around 01/25/2023) for f/u inflammation OS, DFE, OCT.  There are no Patient Instructions on file for this visit.  This document serves as a record of services personally performed by Karie Chimera, MD, PhD. It was created on their behalf by Glee Arvin. Manson Passey, OA an ophthalmic technician. The creation of this record is the provider's dictation and/or activities during the visit.    Electronically signed by: Glee Arvin. Manson Passey, OA 01/23/23 10:48 PM  Karie Chimera, M.D., Ph.D. Diseases & Surgery of the Retina and Vitreous [facility]  I have reviewed the above documentation for accuracy and completeness, and I agree with the above. Karie Chimera, M.D., Ph.D. 01/23/23 10:48 PM   Abbreviations: M myopia  (nearsighted); A astigmatism; H hyperopia (farsighted); P presbyopia; Mrx spectacle prescription;  CTL contact lenses; OD right eye; OS left eye; OU both eyes  XT exotropia; ET esotropia; PEK punctate epithelial keratitis; PEE punctate epithelial erosions; DES dry eye syndrome; MGD meibomian gland dysfunction; ATs artificial tears; PFAT's preservative free artificial tears; NSC nuclear sclerotic cataract; PSC posterior subcapsular cataract; ERM epi-retinal membrane; PVD posterior vitreous detachment; RD retinal detachment; DM diabetes mellitus; DR diabetic retinopathy; NPDR non-proliferative diabetic retinopathy; PDR proliferative diabetic retinopathy; CSME clinically significant macular edema; DME diabetic macular edema; dbh dot blot hemorrhages; CWS cotton wool spot; POAG primary open angle glaucoma; C/D cup-to-disc ratio; HVF humphrey visual field; GVF goldmann visual field; OCT optical coherence tomography; IOP intraocular pressure; BRVO Branch retinal vein occlusion; CRVO central retinal vein occlusion; CRAO central retinal artery occlusion; BRAO branch retinal artery occlusion; RT retinal tear; SB scleral buckle; PPV pars plana vitrectomy; VH Vitreous hemorrhage; PRP panretinal laser photocoagulation; IVK intravitreal kenalog; VMT  vitreomacular traction; MH Macular hole;  NVD neovascularization of the disc; NVE neovascularization elsewhere; AREDS age related eye disease study; ARMD age related macular degeneration; POAG primary open angle glaucoma; EBMD epithelial/anterior basement membrane dystrophy; ACIOL anterior chamber intraocular lens; IOL intraocular lens; PCIOL posterior chamber intraocular lens; Phaco/IOL phacoemulsification with intraocular lens placement; PRK photorefractive keratectomy; LASIK laser assisted in situ keratomileusis; HTN hypertension; DM diabetes mellitus; COPD chronic obstructive pulmonary disease

## 2023-01-24 NOTE — Progress Notes (Signed)
[facility] - Clinic Note  01/25/2023    CHIEF COMPLAINT Patient presents for Retina Follow Up  HISTORY OF PRESENT ILLNESS: Eric Key is a 67 y.o. male who presents to the clinic today for:   HPI     Retina Follow Up   In left eye.  This started 2 days ago.  Duration of 2 days.  Since onset it is stable.  I, the attending physician,  performed the HPI with the patient and updated documentation appropriately.        Comments   2 day retina follow up inflammation pt is reporting no vision changes noticed he denies any flashes or floaters he is using PF and Oflox qid os       Last edited by Rennis Chris, MD on 01/26/2023  8:54 PM.    Pt states he is starting to feel better and back to baseline, he is planning on having cataract sx with Dr. Cathey Endow  Referring physician: [name], [name], MD [address] [place],  [place] [address]  HISTORICAL INFORMATION:   Selected notes from the MEDICAL RECORD NUMBER Referred by Dr. Ginette Otto for concern of mac heme OS.   CURRENT MEDICATIONS: Current Outpatient Medications (Ophthalmic Drugs)  Medication Sig   ofloxacin (OCUFLOX) 0.3 % ophthalmic solution Place 1 drop into the left eye 4 (four) times daily.   prednisoLONE acetate (PRED FORTE) 1 % ophthalmic suspension Place 1 drop into the left eye 4 (four) times daily.   No current facility-administered medications for this visit. (Ophthalmic Drugs)   Current Outpatient Medications (Other)  Medication Sig   buPROPion (ZYBAN) 150 MG 12 hr tablet Take 1 tablet (150 mg total) by mouth 2 (two) times daily.   rosuvastatin (CRESTOR) 5 MG tablet Take 1 tablet (5 mg total) by mouth daily.   No current facility-administered medications for this visit. (Other)   REVIEW OF SYSTEMS: ROS   Positive for: Eyes Negative for: Constitutional, Gastrointestinal, Neurological, Skin, Genitourinary, Musculoskeletal, HENT, Endocrine, Cardiovascular, Respiratory, Psychiatric,  Allergic/Imm, Heme/Lymph Last edited by Etheleen Mayhew, COT on 01/25/2023  7:51 AM.      ALLERGIES Allergies  Allergen Reactions   Penicillins Hives   PAST MEDICAL HISTORY Past Medical History:  Diagnosis Date   Cataract    Mixed form OU   History of adenomatous polyps of colon 03/16/2020   Hypertension    past hx    Hypertensive retinopathy    OU   Macular degeneration    Dry OD; Wet OS   Past Surgical History:  Procedure Laterality Date   ACHILLES TENDON REPAIR  2010   CHOLECYSTECTOMY     COLONOSCOPY     GALLBLADDER SURGERY     FAMILY HISTORY Family History  Problem Relation Age of Onset   Diabetes Brother    Heart disease Brother    Diabetes Maternal Grandfather    Colon polyps Neg Hx    Colon cancer Neg Hx    Esophageal cancer Neg Hx    Rectal cancer Neg Hx    Stomach cancer Neg Hx    SOCIAL HISTORY Social History   Tobacco Use   Smoking status: Some Days    Current packs/day: 0.00    Types: Cigarettes, Cigars    Last attempt to quit: 02/05/2020    Years since quitting: 2.9   Smokeless tobacco: Never  Vaping Use   Vaping status: Never Used  Substance Use Topics   Alcohol use: Yes  Comment: occ   Drug use: Never       OPHTHALMIC EXAM: Base Eye Exam     Visual Acuity (Snellen - Linear)       Right Left   Dist [place]  20/40 -1   Dist cc 20/20 -2    Dist ph cc  NI    Correction: Glasses         Tonometry (Tonopen, 7:57 AM)       Right Left   Pressure 11 15         Pupils       Pupils Dark Light Shape React APD   Right PERRL 3 2 Round Brisk None   Left PERRL 3 2 Round Brisk None         Visual Fields       Left Right    Full Full         Extraocular Movement       Right Left    Full, Ortho Full, Ortho         Neuro/Psych     Oriented x3: Yes   Mood/Affect: Normal         Dilation     Both eyes: 2.5% Phenylephrine @ 7:57 AM           Slit Lamp and Fundus Exam     Slit Lamp Exam        Right Left   Lids/Lashes Dermatochalasis - upper lid Dermatochalasis - upper lid   Conjunctiva/Sclera White and quiet White and quiet   Cornea 2+ PEE, mild Arcus, tear film debris mild Arcus, 1+ inferior punctate epithelial erosions, tear film debris -- improved   Anterior Chamber deep, clear, narrow angles deep, clear, narrow angles, No cell or flare, no hypopyon   Iris Round and dilated, mild anterior bowing Round and moderately dilated, mild anterior bowing   Lens 2-3+ Nuclear sclerosis, 2-3+ Cortical cataract 2-3+ Nuclear sclerosis, 2-3+ Cortical cataract, 2+ Posterior subcapsular cataract   Anterior Vitreous Vitreous syneresis Vitreous syneresis         Fundus Exam       Right Left   Disc Pink and Sharp, focal PPP ST, Compact Pink and Sharp, Compact   C/D Ratio 0.2 0.4   Macula Flat, Drusen, RPE mottling and clumping, early Atrophy, punctate IRH -- fading Hazy view, abnormal foveal reflex, +CNV with heme stably improved, +central edema / cystic changes -- stably improved, Drusen, RPE mottling and clumping, early subretinal fibrosis, punctate IRH - improved   Vessels attenuated, tortuous, perivascular pigment clumping peripherally, mild AV crossing changes attenuated, mild tortuousity, perivascular pigment clumping peripherally   Periphery Attached, peripheral pigmented CR scarring / atrophy and perivascular bone spicules 360, No heme Attached, peripheral pigmented CR scarring / atrophy and perivascular bone spicules 360, No heme           Refraction     Wearing Rx       Sphere Cylinder Axis Add   Right +0.25 +1.50 003 +2.00   Left +0.25 +0.75 169 +2.00           IMAGING AND PROCEDURES  Imaging and Procedures for @TODAY @  OCT, Retina - OU - Both Eyes       Right Eye Quality was good. Central Foveal Thickness: 285. Progression has been stable. Findings include normal foveal contour, no IRF, no SRF, retinal drusen , pigment epithelial detachment, outer retinal atrophy  (Persistent vitreous opacities; stable low lying PEDs and ORA, partial PVD).  Left Eye Quality was good. Central Foveal Thickness: 288. Progression has been stable. Findings include abnormal foveal contour, retinal drusen , subretinal hyper-reflective material, intraretinal fluid, pigment epithelial detachment, outer retinal atrophy (stable improvement in IRF overlying stable low-lying PED / [facility] temporal fovea and macula ).   Notes *Images captured and stored on drive  Diagnosis / Impression:  OD: non-exu ARMD w/ stable low lying PEDs and ORA OS: exu ARMD -- stable improvement in IRF overlying stable low-lying PED / [facility] temporal fovea and macula -- no fluid  Clinical management:  See below  Abbreviations: NFP - Normal foveal profile. CME - cystoid macular edema. PED - pigment epithelial detachment. IRF - intraretinal fluid. SRF - subretinal fluid. EZ - ellipsoid zone. ERM - epiretinal membrane. ORA - outer retinal atrophy. ORT - outer retinal tubulation. SRHM - subretinal hyper-reflective material            ASSESSMENT/PLAN:   ICD-10-CM   1. Exudative age-related macular degeneration of left eye with active choroidal neovascularization (HCC)  H35.3221 OCT, Retina - OU - Both Eyes    2. 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 Essential hypertension  I10     5. Hypertensive retinopathy of both eyes  H35.033     6. Combined forms of age-related cataract of both eyes  H25.813     7. Dry eyes  H04.123      **Pt presents for f/u of acute pain, redness and decreased VA OS since Sunday, 10.06.24 - pt underwent IVV OS on 10.01.24  - pt reports frequent eye rubbing - BCVA OS back to baseline -- 20/40 from 20/50 - exam shows corneal dryness and mild conjunctivitis OS improved on oflox and PF - no endophthalmitis / hypopyon  - continue Ofloxacin QID until Monday, October 14th, d/c PF,  continue AT's  - recommend artificial tears and avoidance of eye rubbing  - f/u Friday, October 11  1. 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, OS  - s/p IVA OS #1 (03.04.21), #2 (03.04.21), #3 (04.30.21) -- IVA resistance  - s/p IVE OS #1 (06.04.21), #2 (07.08.21), #3 (08.09.21), #4 (09.13.21), #5 (10.18.21), #6 (11.24.21), #7 (12.31.21), #8 (02.11.22), #9 (03.25.22), #10 (05.02.22), #11 (06.06.22), #12 (07.13.22), #13 (08.17.22), #14 (09.21.22), #15 (10.26.22), #16 (11.29.22), #17 (01.03.23), #18 (02.07.23), #19 (03.16.23), #20 (04.20.23), #21 (05.19.23), #22 (06.20.23) -- IVE resistance  - s/p IVV OS #1 (07.18.23), #2 (08.15.23), #3 (09.12.23), #4 (10.10.23), #5 (11.14.23), #6 (12.21.23), #7 (01.25.24), #8 (02.29.24), #9 (04.11.24), #10 (05.20.24), #11 (07.01.24), #12 (08.13.24), #13 (10.01.24)  - exam shows central CNV with +subretinal heme -- improved  - BCVA 20/40 OS -- stable  - OCT shows stable improvement in IRF overlying stable low-lying PED / [facility] temporal fovea and macula -- no fluid  - IVV informed consent obtained and signed, 10.01.24  - f/u November 26th as scheduled -- DFE/OCT, possible injection  2. Age related macular degeneration, non-exudative, OD  - The incidence, anatomy, and pathology of dry AMD, risk of progression, and the AREDS and AREDS 2 study including smoking risks discussed with patient.   - recommend Amsler grid monitoring  3. Pigmentary retinopathy OU  - peripheral pigmented CR scarring and perivascular pigment clumping / bone spicules OU  - VA remains fairly good (20/20 OD, 20/40 OS with exudative ARMD OS)  - denies night blindness  - denies family history of vision problems  -  discussed findings  - broad differential for pigmentary retinopathy:  Congenital (RP), infectious (TB, lyme dx, syphilis, toxo, bartonella), autoimmune and toxic etiologies  - labs checked (4.2.21):  All essential WNL   Rheumatoid factor   CBC   Comprehensive metabolic  panel     RPR   ANA   ESR, CRP   ACE   ANCA   Toxoplasma antibodies IgG / IgM   Quantiferon - TB Gold Plus   HLA, A, B, C (IR)   Fluorescent treponemal ab (fla) - IgG - bld   VDRL Serum   Bartonella antibody, 06.04.21  - [facility] -- drawn on 6.4.21                         Results Positive for AGBL5, ALMS1, OPA1 -- unknown significance              - monitor  4,5. Hypertensive retinopathy OU  - discussed importance of tight BP control  - continue to monitor  6. Mixed form age related cataracts OU  - The symptoms of cataract, surgical options, and treatments and risks were discussed with patient.  - discussed diagnosis and progression  - will refer to Dr. Cathey Endow for consult  7. Dry eyes OU - recommend artificial tears and lubricating ointment as needed   Ophthalmic Meds Ordered this visit:  No orders of the defined types were placed in this encounter.    Return for as scheduled 11.26.24 -- DFE/OCT, Possible Injxn.  There are no Patient Instructions on file for this visit. This document serves as a record of services personally performed by Karie Chimera, MD, PhD. It was created on their behalf by Berlin Hun COT, an ophthalmic technician. The creation of this record is the provider's dictation and/or activities during the visit.    Electronically signed by: Berlin Hun COT 10.10.24 8:54 PM  This document serves as a record of services personally performed by Karie Chimera, MD, PhD. It was created on their behalf by Glee Arvin. Manson Passey, OA an ophthalmic technician. The creation of this record is the provider's dictation and/or activities during the visit.    Electronically signed by: Glee Arvin. Manson Passey, OA 01/26/23 8:54 PM  Karie Chimera, M.D., Ph.D. Diseases & Surgery of the Retina and Vitreous [facility] 01/25/2023   I have reviewed the above documentation for accuracy and completeness, and I agree with the above. Karie Chimera, M.D., Ph.D. 01/26/23 8:57 PM    Abbreviations: M myopia (nearsighted); A astigmatism; H hyperopia (farsighted); P presbyopia; Mrx spectacle prescription;  CTL contact lenses; OD right eye; OS left eye; OU both eyes  XT exotropia; ET esotropia; PEK punctate epithelial keratitis; PEE punctate epithelial erosions; DES dry eye syndrome; MGD meibomian gland dysfunction; ATs artificial tears; PFAT's preservative free artificial tears; NSC nuclear sclerotic cataract; PSC posterior subcapsular cataract; ERM epi-retinal membrane; PVD posterior vitreous detachment; RD retinal detachment; DM diabetes mellitus; DR diabetic retinopathy; NPDR non-proliferative diabetic retinopathy; PDR proliferative diabetic retinopathy; CSME clinically significant macular edema; DME diabetic macular edema; dbh dot blot hemorrhages; CWS cotton wool spot; POAG primary open angle glaucoma; C/D cup-to-disc ratio; HVF humphrey visual field; GVF goldmann visual field; OCT optical coherence tomography; IOP intraocular pressure; BRVO Branch retinal vein occlusion; CRVO central retinal vein occlusion; CRAO central retinal artery occlusion; BRAO branch retinal artery occlusion; RT retinal tear; SB scleral buckle; PPV pars plana vitrectomy; VH Vitreous hemorrhage; PRP panretinal  laser photocoagulation; IVK intravitreal kenalog; VMT vitreomacular traction; MH Macular hole;  NVD neovascularization of the disc; NVE neovascularization elsewhere; AREDS age related eye disease study; ARMD age related macular degeneration; POAG primary open angle glaucoma; EBMD epithelial/anterior basement membrane dystrophy; ACIOL anterior chamber intraocular lens; IOL intraocular lens; PCIOL posterior chamber intraocular lens; Phaco/IOL phacoemulsification with intraocular lens placement; PRK photorefractive keratectomy; LASIK laser assisted in situ keratomileusis; HTN hypertension; DM diabetes mellitus; COPD chronic obstructive pulmonary disease

## 2023-01-25 ENCOUNTER — Ambulatory Visit (INDEPENDENT_AMBULATORY_CARE_PROVIDER_SITE_OTHER): Payer: Medicare PPO | Admitting: Ophthalmology

## 2023-01-25 ENCOUNTER — Encounter (INDEPENDENT_AMBULATORY_CARE_PROVIDER_SITE_OTHER): Payer: Self-pay | Admitting: Ophthalmology

## 2023-01-25 DIAGNOSIS — H353112 Nonexudative age-related macular degeneration, right eye, intermediate dry stage: Secondary | ICD-10-CM | POA: Diagnosis not present

## 2023-01-25 DIAGNOSIS — H04123 Dry eye syndrome of bilateral lacrimal glands: Secondary | ICD-10-CM

## 2023-01-25 DIAGNOSIS — H25813 Combined forms of age-related cataract, bilateral: Secondary | ICD-10-CM

## 2023-01-25 DIAGNOSIS — H3552 Pigmentary retinal dystrophy: Secondary | ICD-10-CM

## 2023-01-25 DIAGNOSIS — H353221 Exudative age-related macular degeneration, left eye, with active choroidal neovascularization: Secondary | ICD-10-CM

## 2023-01-25 DIAGNOSIS — I10 Essential (primary) hypertension: Secondary | ICD-10-CM

## 2023-01-25 DIAGNOSIS — H35033 Hypertensive retinopathy, bilateral: Secondary | ICD-10-CM

## 2023-01-26 ENCOUNTER — Encounter (INDEPENDENT_AMBULATORY_CARE_PROVIDER_SITE_OTHER): Payer: Self-pay | Admitting: Ophthalmology

## 2023-01-30 ENCOUNTER — Encounter: Payer: Self-pay | Admitting: Family Medicine

## 2023-01-30 ENCOUNTER — Ambulatory Visit (INDEPENDENT_AMBULATORY_CARE_PROVIDER_SITE_OTHER): Payer: Medicare PPO | Admitting: Family Medicine

## 2023-01-30 VITALS — BP 136/82 | HR 95 | Ht 73.0 in | Wt 245.0 lb

## 2023-01-30 DIAGNOSIS — E1169 Type 2 diabetes mellitus with other specified complication: Secondary | ICD-10-CM | POA: Insufficient documentation

## 2023-01-30 DIAGNOSIS — Z7984 Long term (current) use of oral hypoglycemic drugs: Secondary | ICD-10-CM

## 2023-01-30 DIAGNOSIS — E119 Type 2 diabetes mellitus without complications: Secondary | ICD-10-CM

## 2023-01-30 LAB — BAYER DCA HB A1C WAIVED: HB A1C (BAYER DCA - WAIVED): 11.9 % — ABNORMAL HIGH (ref 4.8–5.6)

## 2023-01-30 MED ORDER — SEMAGLUTIDE(0.25 OR 0.5MG/DOS) 2 MG/3ML ~~LOC~~ SOPN
PEN_INJECTOR | SUBCUTANEOUS | 1 refills | Status: DC
Start: 2023-01-30 — End: 2023-03-22

## 2023-01-30 MED ORDER — METFORMIN HCL 500 MG PO TABS
500.0000 mg | ORAL_TABLET | Freq: Two times a day (BID) | ORAL | 3 refills | Status: DC
Start: 2023-01-30 — End: 2023-05-15

## 2023-01-30 NOTE — Progress Notes (Signed)
BP 136/82   Pulse 95   Ht 6\' 1"  (1.854 m)   Wt 245 lb (111.1 kg)   SpO2 95%   BMI 32.32 kg/m    Subjective:   [name], male    DOB: 04-08-1956, 67 y.o.   MRN: [id number]  HPI: Eric Key is a 67 y.o. male presenting on 01/30/2023 for Medical Management of Chronic Issues   HPI Elevated blood sugar Patient had an elevated random blood sugar in the last blood sugar and this was a couple weeks ago.  His blood sugar was 317 on his physical exam.  He does have a lot of [employer] which About 20 g of sugar based on what I can find online.  We discussed switching to the zeros.  He also been eating more potatoes and cakes for breakfast.  Relevant past medical, surgical, family and social history reviewed and updated as indicated. Interim medical history since our last visit reviewed. Allergies and medications reviewed and updated.  Review of Systems  Eyes:  Negative for discharge.  Respiratory:  Negative for shortness of breath and wheezing.   Cardiovascular:  Negative for chest pain and leg swelling.  Endocrine: Negative for cold intolerance, heat intolerance, polydipsia and polyuria.  Musculoskeletal:  Negative for back pain and gait problem.  Skin:  Negative for rash.  All other systems reviewed and are negative.   Per HPI unless specifically indicated above   Allergies as of 01/30/2023       Reactions   Penicillins Hives        Medication List        Accurate as of January 30, 2023  9:57 AM. If you have any questions, ask your nurse or doctor.          STOP taking these medications    ofloxacin 0.3 % ophthalmic solution Commonly known as: OCUFLOX Stopped by: [name] Antoniette Peake   prednisoLONE acetate 1 % ophthalmic suspension Commonly known as: PRED FORTE Stopped by: [name] Keevan Wolz       TAKE these medications    buPROPion 150 MG 12 hr tablet Commonly known as: Zyban Take 1 tablet (150 mg total) by mouth 2 (two) times  daily.   metFORMIN 500 MG tablet Commonly known as: GLUCOPHAGE Take 1 tablet (500 mg total) by mouth 2 (two) times daily with a meal. Started by: [name] Shatasia Cutshaw   rosuvastatin 5 MG tablet Commonly known as: CRESTOR Take 1 tablet (5 mg total) by mouth daily.   Semaglutide(0.25 or 0.5MG /DOS) 2 MG/3ML Sopn Inject 0.25 mg into the skin once a week for 28 days, THEN 0.5 mg once a week. Start taking on: January 30, 2023 Started by: Ivin Booty A Zolton Dowson         Objective:   BP 136/82   Pulse 95   Ht 6\' 1"  (1.854 m)   Wt 245 lb (111.1 kg)   SpO2 95%   BMI 32.32 kg/m   Wt Readings from Last 3 Encounters:  01/30/23 245 lb (111.1 kg)  01/18/23 249 lb (112.9 kg)  12/28/21 255 lb (115.7 kg)    Physical Exam Vitals and nursing note reviewed.  Constitutional:      General: He is not in acute distress.    Appearance: He is well-developed. He is not diaphoretic.  Eyes:     General: No scleral icterus.    Conjunctiva/sclera: Conjunctivae normal.  Neck:     Thyroid: No thyromegaly.  Cardiovascular:  Rate and Rhythm: Normal rate and regular rhythm.     Heart sounds: Normal heart sounds. No murmur heard. Pulmonary:     Effort: Pulmonary effort is normal. No respiratory distress.     Breath sounds: Normal breath sounds. No wheezing.  Neurological:     Mental Status: He is alert and oriented to person, place, and time.     Coordination: Coordination normal.  Psychiatric:        Behavior: Behavior normal.     Results for orders placed or performed in visit on 01/18/23  PSA, total and free  Result Value Ref Range   Prostate Specific Ag, Serum 1.2 0.0 - 4.0 ng/mL   PSA, Free 0.27 N/A ng/mL   PSA, Free Pct 22.5 %  CBC with Differential/Platelet  Result Value Ref Range   WBC 8.5 3.4 - 10.8 x10E3/uL   RBC 5.31 4.14 - 5.80 x10E6/uL   Hemoglobin 15.9 13.0 - 17.7 g/dL   Hematocrit 41.3 24.4 - 51.0 %   MCV 93 79 - 97 fL   MCH 29.9 26.6 - 33.0 pg   MCHC 32.3 31.5 - 35.7  g/dL   RDW 01.0 (L) 27.2 - 53.6 %   Platelets 142 (L) 150 - 450 x10E3/uL   Neutrophils 54 Not Estab. %   Lymphs 36 Not Estab. %   Monocytes 7 Not Estab. %   Eos 2 Not Estab. %   Basos 1 Not Estab. %   Neutrophils Absolute 4.7 1.4 - 7.0 x10E3/uL   Lymphocytes Absolute 3.1 0.7 - 3.1 x10E3/uL   Monocytes Absolute 0.6 0.1 - 0.9 x10E3/uL   EOS (ABSOLUTE) 0.1 0.0 - 0.4 x10E3/uL   Basophils Absolute 0.1 0.0 - 0.2 x10E3/uL   Immature Granulocytes 0 Not Estab. %   Immature Grans (Abs) 0.0 0.0 - 0.1 x10E3/uL  CMP14+EGFR  Result Value Ref Range   Glucose 317 (H) 70 - 99 mg/dL   BUN 16 8 - 27 mg/dL   Creatinine, Ser 6.44 0.76 - 1.27 mg/dL   eGFR 75 >03 KV/QQV/9.56   BUN/Creatinine Ratio 15 10 - 24   Sodium 132 (L) 134 - 144 mmol/L   Potassium 4.2 3.5 - 5.2 mmol/L   Chloride 96 96 - 106 mmol/L   CO2 21 20 - 29 mmol/L   Calcium 9.3 8.6 - 10.2 mg/dL   Total Protein 7.5 6.0 - 8.5 g/dL   Albumin 4.1 3.9 - 4.9 g/dL   Globulin, Total 3.4 1.5 - 4.5 g/dL   Bilirubin Total 0.3 0.0 - 1.2 mg/dL   Alkaline Phosphatase 83 44 - 121 IU/L   AST 16 0 - 40 IU/L   ALT 13 0 - 44 IU/L  Lipid panel  Result Value Ref Range   Cholesterol, Total 202 (H) 100 - 199 mg/dL   Triglycerides 387 (H) 0 - 149 mg/dL   HDL 37 (L) >56 mg/dL   VLDL Cholesterol Cal 55 (H) 5 - 40 mg/dL   LDL Chol Calc (NIH) 433 (H) 0 - 99 mg/dL   Chol/HDL Ratio 5.5 (H) 0.0 - 5.0 ratio    Assessment & Plan:   Problem List Items Addressed This Visit       Endocrine   New onset type 2 diabetes mellitus (HCC) - Primary   Relevant Medications   metFORMIN (GLUCOPHAGE) 500 MG tablet   Semaglutide,0.25 or 0.5MG /DOS, 2 MG/3ML SOPN   Other Relevant Orders   Bayer DCA Hb A1c Waived    A1c today is 11.9, new onset diabetes  Discussed extensive dietary and lifestyle changes, he does like to have [employer], we discussed possibly switching to [employer] zeros and reducing potatoes and carbs.  Discussed going to the  nutritionist but he wants to try on his own first.  Will start metformin and Ozempic Follow up plan: Return in about 3 months (around 05/02/2023), or if symptoms worsen or fail to improve, for Diabetes recheck.  Counseling provided for all of the vaccine components Orders Placed This Encounter  Procedures   Bayer DCA Hb A1c Waived    Arville Care, MD [facility] 01/30/2023, 9:57 AM

## 2023-02-24 ENCOUNTER — Encounter: Payer: Self-pay | Admitting: Family Medicine

## 2023-02-25 MED ORDER — FREESTYLE LIBRE 3 PLUS SENSOR MISC: 5 refills | Status: DC

## 2023-02-25 NOTE — Telephone Encounter (Signed)
[name], please review for Libre or dexcom  The referral has been placed for Lung Cancer Screening. Pt will be given info on who to call.

## 2023-02-26 NOTE — Progress Notes (Signed)
[facility] - Clinic Note  03/12/2023    CHIEF COMPLAINT Patient presents for Retina Follow Up  HISTORY OF PRESENT ILLNESS: Eric Key is a 67 y.o. male who presents to the clinic today for:   HPI     Retina Follow Up   In left eye.  This started weeks ago.  Duration of 8 days.  Since onset it is stable.  I, the attending physician,  performed the HPI with the patient and updated documentation appropriately.        Comments   Patient feels the vision is doing just fine. He is not using eye drops. His blood sugar was 113.      Last edited by Rennis Chris, MD on 03/12/2023 11:26 AM.    Pt states vision is the same, he had cataract sx OS with Dr. Cathey Endow on October 23rd   Referring physician: [name], [name], MD [address] [place],  [place] [address]  HISTORICAL INFORMATION:   Selected notes from the MEDICAL RECORD NUMBER Referred by Dr. Ginette Otto for concern of mac heme OS.   CURRENT MEDICATIONS: Current Outpatient Medications (Ophthalmic Drugs)  Medication Sig   ketorolac (ACULAR) 0.5 % ophthalmic solution Place 1 drop into the left eye 4 (four) times daily.   prednisoLONE acetate (PRED FORTE) 1 % ophthalmic suspension Place 1 drop into the left eye 4 (four) times daily.   No current facility-administered medications for this visit. (Ophthalmic Drugs)   Current Outpatient Medications (Other)  Medication Sig   buPROPion (ZYBAN) 150 MG 12 hr tablet Take 1 tablet (150 mg total) by mouth 2 (two) times daily.   Continuous Glucose Sensor (FREESTYLE LIBRE 3 PLUS SENSOR) MISC 1 sensor change every 15 days.   metFORMIN (GLUCOPHAGE) 500 MG tablet Take 1 tablet (500 mg total) by mouth 2 (two) times daily with a meal.   rosuvastatin (CRESTOR) 5 MG tablet Take 1 tablet (5 mg total) by mouth daily.   Semaglutide,0.25 or 0.5MG /DOS, 2 MG/3ML SOPN Inject 0.25 mg into the skin once a week for 28 days, THEN 0.5 mg once a week.   No current  facility-administered medications for this visit. (Other)   REVIEW OF SYSTEMS: ROS   Positive for: Endocrine, Eyes Negative for: Constitutional, Gastrointestinal, Neurological, Skin, Genitourinary, Musculoskeletal, HENT, Cardiovascular, Respiratory, Psychiatric, Allergic/Imm, Heme/Lymph Last edited by Charlette Caffey, COT on 03/12/2023  7:36 AM.       ALLERGIES Allergies  Allergen Reactions   Penicillins Hives   PAST MEDICAL HISTORY Past Medical History:  Diagnosis Date   Cataract    Mixed form OU   History of adenomatous polyps of colon 03/16/2020   Hypertension    past hx    Hypertensive retinopathy    OU   Macular degeneration    Dry OD; Wet OS   Past Surgical History:  Procedure Laterality Date   ACHILLES TENDON REPAIR  2010   CHOLECYSTECTOMY     COLONOSCOPY     GALLBLADDER SURGERY     FAMILY HISTORY Family History  Problem Relation Age of Onset   Diabetes Brother    Heart disease Brother    Diabetes Maternal Grandfather    Colon polyps Neg Hx    Colon cancer Neg Hx    Esophageal cancer Neg Hx    Rectal cancer Neg Hx    Stomach cancer Neg Hx    SOCIAL HISTORY Social History   Tobacco Use   Smoking status: Some Days  Current packs/day: 0.00    Types: Cigarettes, Cigars    Last attempt to quit: 02/05/2020    Years since quitting: 3.0   Smokeless tobacco: Never  Vaping Use   Vaping status: Never Used  Substance Use Topics   Alcohol use: Yes    Comment: occ   Drug use: Never       OPHTHALMIC EXAM: Base Eye Exam     Visual Acuity (Snellen - Linear)       Right Left   Dist cc 20/20 +1 20/40   Dist ph cc  NI         Tonometry (Tonopen, 7:39 AM)       Right Left   Pressure 17 13         Pupils       Dark Light Shape React APD   Right 3 2 Round Brisk None   Left 3 2 Round Brisk None         Visual Fields       Left Right    Full Full         Extraocular Movement       Right Left    Full, Ortho Full, Ortho          Neuro/Psych     Oriented x3: Yes   Mood/Affect: Normal         Dilation     Both eyes: 1.0% Mydriacyl, 2.5% Phenylephrine @ 7:36 AM           Slit Lamp and Fundus Exam     Slit Lamp Exam       Right Left   Lids/Lashes Dermatochalasis - upper lid Dermatochalasis - upper lid   Conjunctiva/Sclera White and quiet White and quiet   Cornea trace PEE, mild Arcus, tear film debris mild Arcus, 2+ fine punctate epithelial erosions inferiorly, tear film debris -- improved, well healed cataract wound   Anterior Chamber deep, clear, narrow angles deep, 0.5+cell/pigment   Iris Round and dilated, mild anterior bowing Round and moderately dilated, mild anterior bowing   Lens 2-3+ Nuclear sclerosis, 2-3+ Cortical cataract PC IOL in good position   Anterior Vitreous Vitreous syneresis Vitreous syneresis, fine cell/pigment         Fundus Exam       Right Left   Disc Pink and Sharp, focal PPP ST, Compact Pink and Sharp, Compact   C/D Ratio 0.2 0.4   Macula Flat, Drusen, RPE mottling and clumping, early Atrophy, punctate IRH -- fading abnormal foveal reflex, +CNV with heme stably improved, +central edema / cystic changes -- increased, Drusen, RPE mottling and clumping, early subretinal fibrosis, punctate IRH - stably improved, no heme   Vessels attenuated, tortuous, perivascular pigment clumping peripherally, mild AV crossing changes attenuated, mild tortuousity, perivascular pigment clumping peripherally   Periphery Attached, peripheral pigmented CR scarring / atrophy and perivascular bone spicules 360, No heme Attached, peripheral pigmented CR scarring / atrophy and perivascular bone spicules 360, No heme           Refraction     Wearing Rx       Sphere Cylinder Axis Add   Right +0.25 +1.50 003 +2.00   Left +0.25 +0.75 169 +2.00           IMAGING AND PROCEDURES  Imaging and Procedures for @TODAY @  OCT, Retina - OU - Both Eyes       Right Eye Quality was good.  Central Foveal Thickness: 293. Progression has been stable.  Findings include normal foveal contour, no IRF, no SRF, retinal drusen , pigment epithelial detachment, outer retinal atrophy (Persistent vitreous opacities; stable low lying PEDs and ORA, partial PVD).   Left Eye Quality was good. Central Foveal Thickness: 400. Progression has worsened. Findings include abnormal foveal contour, retinal drusen , subretinal hyper-reflective material, intraretinal fluid, pigment epithelial detachment, outer retinal atrophy (Interval increase in IRF overlying stable low-lying PED / [facility] temporal fovea and macula).   Notes *Images captured and stored on drive  Diagnosis / Impression:  OD: non-exu ARMD w/ stable low lying PEDs and ORA OS: exu ARMD -- interval increase in IRF overlying stable low-lying PED / [facility] temporal fovea and macula  Clinical management:  See below  Abbreviations: NFP - Normal foveal profile. CME - cystoid macular edema. PED - pigment epithelial detachment. IRF - intraretinal fluid. SRF - subretinal fluid. EZ - ellipsoid zone. ERM - epiretinal membrane. ORA - outer retinal atrophy. ORT - outer retinal tubulation. SRHM - subretinal hyper-reflective material      Intravitreal Injection, Pharmacologic Agent - OS - Left Eye       Time Out 03/12/2023. 8:03 AM. Confirmed correct patient, procedure, site, and patient consented.   Anesthesia Topical anesthesia was used. Anesthetic medications included Lidocaine 2%, Proparacaine 0.5%.   Procedure Preparation included 5% betadine to ocular surface, eyelid speculum. A supplied (32g) needle was used.   Injection: 6 mg faricimab-svoa 6 MG/0.05ML   Route: Intravitreal, Site: Left Eye   NDC: 40981-191-47, Lot: [id number], Expiration date: 02/14/2024, Waste: 0 mL   Post-op Post injection exam found visual acuity of at least counting fingers. The patient tolerated the procedure well. There were no complications. The patient received  written and verbal post procedure care education. Post injection medications were not given.            ASSESSMENT/PLAN:   ICD-10-CM   1. Exudative age-related macular degeneration of left eye with active choroidal neovascularization (HCC)  H35.3221 OCT, Retina - OU - Both Eyes    Intravitreal Injection, Pharmacologic Agent - OS - Left Eye    faricimab-svoa (VABYSMO) 6mg /0.29mL intravitreal injection    2. 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 Essential hypertension  I10     5. Hypertensive retinopathy of both eyes  H35.033     6. Combined forms of age-related cataract of right eye  H25.811     7. Pseudophakia  Z96.1     8. Dry eyes  H04.123      1. 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, OS  - s/p IVA OS #1 (03.04.21), #2 (03.04.21), #3 (04.30.21) -- IVA resistance  - s/p IVE OS #1 (06.04.21), #2 (07.08.21), #3 (08.09.21), #4 (09.13.21), #5 (10.18.21), #6 (11.24.21), #7 (12.31.21), #8 (02.11.22), #9 (03.25.22), #10 (05.02.22), #11 (06.06.22), #12 (07.13.22), #13 (08.17.22), #14 (09.21.22), #15 (10.26.22), #16 (11.29.22), #17 (01.03.23), #18 (02.07.23), #19 (03.16.23), #20 (04.20.23), #21 (05.19.23), #22 (06.20.23) -- IVE resistance  - s/p IVV OS #1 (07.18.23), #2 (08.15.23), #3 (09.12.23), #4 (10.10.23), #5 (11.14.23), #6 (12.21.23), #7 (01.25.24), #8 (02.29.24), #9 (04.11.24), #10 (05.20.24), #11 (07.01.24), #12 (08.13.24), #13 (10.01.24)  - exam shows central CNV with +subretinal heme -- improved  - BCVA 20/40 OS -- stable  - OCT shows interval increase in IRF overlying stable low-lying PED / [facility] temporal fovea and macula at 8 weeks  - recommend IVV OS #14 today with follow up back  to 6-7 weeks  - pt wishes to proceed with injection  - RBA of procedure discussed, questions answered - see procedure note  - IVV informed consent obtained and signed, 10.01.24  - will add PF and  Prolensa QID OS for possible CME/post-op inflammatory component   - f/u 6-7 weeks -- DFE/OCT, possible injection  2. Age related macular degeneration, non-exudative, OD  - The incidence, anatomy, and pathology of dry AMD, risk of progression, and the AREDS and AREDS 2 study including smoking risks discussed with patient.   - recommend Amsler grid monitoring  3. Pigmentary retinopathy OU  - peripheral pigmented CR scarring and perivascular pigment clumping / bone spicules OU  - VA remains fairly good (20/20 OD, 20/40 OS with exudative ARMD OS)  - denies night blindness  - denies family history of vision problems  - discussed findings  - broad differential for pigmentary retinopathy:  Congenital (RP), infectious (TB, lyme dx, syphilis, toxo, bartonella), autoimmune and toxic etiologies  - labs checked (4.2.21):  All essential WNL   Rheumatoid factor   CBC   Comprehensive metabolic panel     RPR   ANA   ESR, CRP   ACE   ANCA   Toxoplasma antibodies IgG / IgM   Quantiferon - TB Gold Plus   HLA, A, B, C (IR)   Fluorescent treponemal ab (fla) - IgG - bld   VDRL Serum   Bartonella antibody, 06.04.21  - [facility] -- drawn on 6.4.21                         Results Positive for AGBL5, ALMS1, OPA1 -- unknown significance              - monitor  4,5. Hypertensive retinopathy OU  - discussed importance of tight BP control  - continue to monitor  6. Mixed form age related cataracts OD  - The symptoms of cataract, surgical options, and treatments and risks were discussed with patient.  - discussed diagnosis and progression  - under the expert management of Dr. Cathey Endow   Pseudophakia OS  - s/p CE/IOL OS (Dr. Cathey Endow, 10.23.24)  - IOL in good position  - OCT shows mild cystic changes - ?CME / post-op inflammatory component  - restarting PF and Prolensa QID OS as above  - monitor  7. Dry eyes OU - recommend artificial tears and lubricating ointment as needed   Ophthalmic  Meds Ordered this visit:  Meds ordered this encounter  Medications   prednisoLONE acetate (PRED FORTE) 1 % ophthalmic suspension    Sig: Place 1 drop into the left eye 4 (four) times daily.    Dispense:  15 mL    Refill:  0   ketorolac (ACULAR) 0.5 % ophthalmic solution    Sig: Place 1 drop into the left eye 4 (four) times daily.    Dispense:  10 mL    Refill:  0   faricimab-svoa (VABYSMO) 6mg /0.60mL intravitreal injection     Return for f/u 6-7 weeks, exu ARMD OS, DFE, OCT.  There are no Patient Instructions on file for this visit.  This document serves as a record of services personally performed by Karie Chimera, MD, PhD. It was created on their behalf by Charlette Caffey, COT an ophthalmic technician. The creation of this record is the provider's dictation and/or activities during the visit.    Electronically signed by:  Charlette Caffey, COT  03/12/23 1:33  PM  This document serves as a record of services personally performed by Karie Chimera, MD, PhD. It was created on their behalf by Glee Arvin. Manson Passey, OA an ophthalmic technician. The creation of this record is the provider's dictation and/or activities during the visit.    Electronically signed by: Glee Arvin. Manson Passey, OA 03/12/23 1:33 PM  Karie Chimera, M.D., Ph.D. Diseases & Surgery of the Retina and Vitreous [facility]  I have reviewed the above documentation for accuracy and completeness, and I agree with the above. Karie Chimera, M.D., Ph.D. 03/12/23 1:35 PM  Abbreviations: M myopia (nearsighted); A astigmatism; H hyperopia (farsighted); P presbyopia; Mrx spectacle prescription;  CTL contact lenses; OD right eye; OS left eye; OU both eyes  XT exotropia; ET esotropia; PEK punctate epithelial keratitis; PEE punctate epithelial erosions; DES dry eye syndrome; MGD meibomian gland dysfunction; ATs artificial tears; PFAT's preservative free artificial tears; NSC nuclear sclerotic cataract; PSC  posterior subcapsular cataract; ERM epi-retinal membrane; PVD posterior vitreous detachment; RD retinal detachment; DM diabetes mellitus; DR diabetic retinopathy; NPDR non-proliferative diabetic retinopathy; PDR proliferative diabetic retinopathy; CSME clinically significant macular edema; DME diabetic macular edema; dbh dot blot hemorrhages; CWS cotton wool spot; POAG primary open angle glaucoma; C/D cup-to-disc ratio; HVF humphrey visual field; GVF goldmann visual field; OCT optical coherence tomography; IOP intraocular pressure; BRVO Branch retinal vein occlusion; CRVO central retinal vein occlusion; CRAO central retinal artery occlusion; BRAO branch retinal artery occlusion; RT retinal tear; SB scleral buckle; PPV pars plana vitrectomy; VH Vitreous hemorrhage; PRP panretinal laser photocoagulation; IVK intravitreal kenalog; VMT vitreomacular traction; MH Macular hole;  NVD neovascularization of the disc; NVE neovascularization elsewhere; AREDS age related eye disease study; ARMD age related macular degeneration; POAG primary open angle glaucoma; EBMD epithelial/anterior basement membrane dystrophy; ACIOL anterior chamber intraocular lens; IOL intraocular lens; PCIOL posterior chamber intraocular lens; Phaco/IOL phacoemulsification with intraocular lens placement; PRK photorefractive keratectomy; LASIK laser assisted in situ keratomileusis; HTN hypertension; DM diabetes mellitus; COPD chronic obstructive pulmonary disease

## 2023-03-12 ENCOUNTER — Encounter (INDEPENDENT_AMBULATORY_CARE_PROVIDER_SITE_OTHER): Payer: Self-pay | Admitting: Ophthalmology

## 2023-03-12 ENCOUNTER — Ambulatory Visit (INDEPENDENT_AMBULATORY_CARE_PROVIDER_SITE_OTHER): Payer: Medicare PPO | Admitting: Ophthalmology

## 2023-03-12 DIAGNOSIS — H25813 Combined forms of age-related cataract, bilateral: Secondary | ICD-10-CM

## 2023-03-12 DIAGNOSIS — H35033 Hypertensive retinopathy, bilateral: Secondary | ICD-10-CM

## 2023-03-12 DIAGNOSIS — I10 Essential (primary) hypertension: Secondary | ICD-10-CM | POA: Diagnosis not present

## 2023-03-12 DIAGNOSIS — H353221 Exudative age-related macular degeneration, left eye, with active choroidal neovascularization: Secondary | ICD-10-CM | POA: Diagnosis not present

## 2023-03-12 DIAGNOSIS — H3552 Pigmentary retinal dystrophy: Secondary | ICD-10-CM | POA: Diagnosis not present

## 2023-03-12 DIAGNOSIS — H353112 Nonexudative age-related macular degeneration, right eye, intermediate dry stage: Secondary | ICD-10-CM

## 2023-03-12 DIAGNOSIS — H25811 Combined forms of age-related cataract, right eye: Secondary | ICD-10-CM

## 2023-03-12 DIAGNOSIS — H04123 Dry eye syndrome of bilateral lacrimal glands: Secondary | ICD-10-CM

## 2023-03-12 DIAGNOSIS — Z961 Presence of intraocular lens: Secondary | ICD-10-CM

## 2023-03-12 MED ORDER — PREDNISOLONE ACETATE 1 % OP SUSP: 1.0000 [drp] | Freq: Four times a day (QID) | OPHTHALMIC | 0 refills | Status: DC

## 2023-03-12 MED ORDER — KETOROLAC TROMETHAMINE 0.5 % OP SOLN: 1.0000 [drp] | Freq: Four times a day (QID) | OPHTHALMIC | 0 refills | Status: DC

## 2023-03-12 MED ORDER — FARICIMAB-SVOA 6 MG/0.05ML IZ SOSY
6.0000 mg | PREFILLED_SYRINGE | INTRAVITREAL | Status: AC | PRN
Start: 2023-03-12 — End: 2023-03-12
  Administered 2023-03-12: 6 mg via INTRAVITREAL

## 2023-03-22 ENCOUNTER — Other Ambulatory Visit: Payer: Self-pay | Admitting: Family Medicine

## 2023-03-22 DIAGNOSIS — E119 Type 2 diabetes mellitus without complications: Secondary | ICD-10-CM

## 2023-03-28 ENCOUNTER — Ambulatory Visit: Payer: Medicare PPO

## 2023-03-28 VITALS — BP 124/76 | Ht 73.0 in | Wt 235.6 lb

## 2023-03-28 DIAGNOSIS — Z0000 Encounter for general adult medical examination without abnormal findings: Secondary | ICD-10-CM

## 2023-03-28 NOTE — Progress Notes (Signed)
Subjective:   Eric Key is a 67 y.o. male who presents for an Initial Medicare Annual Wellness Visit.  Visit Complete: In person  Patient Medicare AWV questionnaire was completed by the patient on 03/25/23; I have confirmed that all information answered by patient is correct and no changes since this date.  Cardiac Risk Factors include: advanced age (>49men, >49 women);male gender;hypertension;dyslipidemia;diabetes mellitus     Objective:    Today's Vitals   03/28/23 0836  BP: 124/76  Weight: 235 lb 9.6 oz (106.9 kg)  Height: 6\' 1"  (1.854 m)   Body mass index is 31.08 kg/m.     03/28/2023    8:53 AM 04/12/2020    7:28 AM 03/07/2020    7:42 AM  Advanced Directives  Does Patient Have a Medical Advance Directive? No No No  Would patient like information on creating a medical advance directive? Yes (MAU/Ambulatory/Procedural Areas - Information given)  No - Patient declined    Current Medications (verified) Outpatient Encounter Medications as of 03/28/2023  Medication Sig   buPROPion (ZYBAN) 150 MG 12 hr tablet Take 1 tablet (150 mg total) by mouth 2 (two) times daily.   Continuous Glucose Sensor (FREESTYLE LIBRE 3 PLUS SENSOR) MISC 1 sensor change every 15 days.   ketorolac (ACULAR) 0.5 % ophthalmic solution Place 1 drop into the left eye 4 (four) times daily.   metFORMIN (GLUCOPHAGE) 500 MG tablet Take 1 tablet (500 mg total) by mouth 2 (two) times daily with a meal.   OZEMPIC, 0.25 OR 0.5 MG/DOSE, 2 MG/3ML SOPN INJECT 0.25 MG INTO THE SKIN ONCE A WEEK FOR 28 DAYS THEN 0.5 MG ONCE A WEEK   prednisoLONE acetate (PRED FORTE) 1 % ophthalmic suspension Place 1 drop into the left eye 4 (four) times daily.   rosuvastatin (CRESTOR) 5 MG tablet Take 1 tablet (5 mg total) by mouth daily.   No facility-administered encounter medications on file as of 03/28/2023.    Allergies (verified) Penicillins   History: Past Medical History:  Diagnosis Date   Allergy     Penicillin   Cataract    Mixed form OU   History of adenomatous polyps of colon 03/16/2020   Hypertension    past hx    Hypertensive retinopathy    OU   Macular degeneration    Dry OD; Wet OS   Past Surgical History:  Procedure Laterality Date   ACHILLES TENDON REPAIR  2010   CHOLECYSTECTOMY     COLONOSCOPY     EYE SURGERY     GALLBLADDER SURGERY     Family History  Problem Relation Age of Onset   Diabetes Brother    Heart disease Brother    Diabetes Maternal Grandfather    Colon polyps Neg Hx    Colon cancer Neg Hx    Esophageal cancer Neg Hx    Rectal cancer Neg Hx    Stomach cancer Neg Hx    Social History   Socioeconomic History   Marital status: Divorced    Spouse name: Not on file   Number of children: Not on file   Years of education: Not on file   Highest education level: Not on file  Occupational History    Employer: [employer]  Tobacco Use   Smoking status: Some Days    Types: Cigars   Smokeless tobacco: Never  Vaping Use   Vaping status: Never Used  Substance and Sexual Activity   Alcohol use: Not Currently  Comment: occ   Drug use: Not Currently   Sexual activity: Yes    Birth control/protection: Surgical  Other Topics Concern   Not on file  Social History Narrative   Not on file   Social Drivers of Health   Financial Resource Strain: Low Risk  (03/28/2023)   Overall Financial Resource Strain (CARDIA)    Difficulty of Paying Living Expenses: Not hard at all  Food Insecurity: No Food Insecurity (03/25/2023)   Hunger Vital Sign    Worried About Running Out of Food in the Last Year: Never true    Ran Out of Food in the Last Year: Never true  Transportation Needs: No Transportation Needs (03/25/2023)   PRAPARE - Administrator, Civil Service (Medical): No    Lack of Transportation (Non-Medical): No  Physical Activity: Inactive (03/25/2023)   Exercise Vital Sign    Days of Exercise per Week: 0 days    Minutes of  Exercise per Session: 0 min  Stress: Stress Concern Present (03/25/2023)   [employer] of Occupational Health - Occupational Stress Questionnaire    Feeling of Stress : To some extent  Social Connections: Socially Isolated (03/25/2023)   Social Connection and Isolation Panel [NHANES]    Frequency of Communication with Friends and Family: More than three times a week    Frequency of Social Gatherings with Friends and Family: Never    Attends Religious Services: Never    Diplomatic Services operational officer: No    Attends Engineer, structural: Never    Marital Status: Divorced    Tobacco Counseling Ready to quit: Yes Counseling given: Not Answered   Clinical Intake:  Pre-visit preparation completed: Yes  Pain : No/denies pain  Diabetes: Yes CBG done?: No Did pt. bring in CBG monitor from home?: No  How often do you need to have someone help you when you read instructions, pamphlets, or other written materials from your doctor or pharmacy?: 1 - Never  Interpreter Needed?: No  Information entered by :: Kandis Fantasia LPN   Activities of Daily Living    03/25/2023    7:50 PM  In your present state of health, do you have any difficulty performing the following activities:  Hearing? 0  Vision? 0  Difficulty concentrating or making decisions? 0  Walking or climbing stairs? 0  Dressing or bathing? 0  Doing errands, shopping? 0  Preparing Food and eating ? N  Using the Toilet? N  In the past six months, have you accidently leaked urine? N  Do you have problems with loss of bowel control? N  Managing your Medications? N  Managing your Finances? N  Housekeeping or managing your Housekeeping? N    Patient Care Team: [name], [name], MD as PCP - General (Family Medicine) Rennis Chris, MD as Consulting Physician (Ophthalmology)  Indicate any recent Medical Services you may have received from other than [facility] providers in the past year (date may be  approximate).     Assessment:   This is a routine wellness examination for Eric Key.  Hearing/Vision screen Hearing Screening - Comments:: Denies hearing difficulties   Vision Screening - Comments:: Wears rx glasses - up to date with routine eye exams with Dr. Vanessa Barbara     Goals Addressed             This Visit's Progress    Remain active and independent        Depression Screen    03/28/2023  8:59 AM 01/30/2023    9:05 AM 01/18/2023    3:06 PM 12/28/2021    9:06 AM 12/20/2021    8:20 AM 12/04/2021    8:22 AM 05/03/2021    8:56 AM  PHQ 2/9 Scores  PHQ - 2 Score 0 0 0 0  0 0  PHQ- 9 Score 0 0  0  0   Exception Documentation     Patient refusal      Fall Risk    03/25/2023    7:50 PM 01/30/2023    9:05 AM 01/18/2023    3:06 PM 12/28/2021    9:06 AM 12/04/2021    8:22 AM  Fall Risk   Falls in the past year? 0 0 0 0 0  Number falls in past yr: 0      Injury with Fall? 0        MEDICARE RISK AT HOME: Medicare Risk at Home Any stairs in or around the home?: Yes If so, are there any without handrails?: No Home free of loose throw rugs in walkways, pet beds, electrical cords, etc?: Yes Adequate lighting in your home to reduce risk of falls?: Yes Life alert?: No Use of a cane, walker or w/c?: No Grab bars in the bathroom?: No Shower chair or bench in shower?: No Elevated toilet seat or a handicapped toilet?: No  TIMED UP AND GO:  Was the test performed? Yes  Length of time to ambulate 10 feet: 8 sec Gait steady and fast without use of assistive device    Cognitive Function:        03/28/2023    8:55 AM  6CIT Screen  What Year? 0 points  What month? 0 points  What time? 0 points  Count back from 20 0 points  Months in reverse 0 points  Repeat phrase 0 points  Total Score 0 points    Immunizations Immunization History  Administered Date(s) Administered   Moderna Sars-Covid-2 Vaccination 11/26/2019, 12/23/2019   Tdap 04/21/2020   Zoster  Recombinant(Shingrix) 12/01/2020, 02/03/2021    TDAP status: Up to date  Flu Vaccine status: Declined, Education has been provided regarding the importance of this vaccine but patient still declined. Advised may receive this vaccine at local pharmacy or Health Dept. Aware to provide a copy of the vaccination record if obtained from local pharmacy or Health Dept. Verbalized acceptance and understanding.  Pneumococcal vaccine status: Declined,  Education has been provided regarding the importance of this vaccine but patient still declined. Advised may receive this vaccine at local pharmacy or Health Dept. Aware to provide a copy of the vaccination record if obtained from local pharmacy or Health Dept. Verbalized acceptance and understanding.   Covid-19 vaccine status: Information provided on how to obtain vaccines.   Qualifies for Shingles Vaccine? Yes   Zostavax completed No   Shingrix Completed?: Yes  Screening Tests Health Maintenance  Topic Date Due   FOOT EXAM  Never done   OPHTHALMOLOGY EXAM  Never done   Diabetic kidney evaluation - Urine ACR  Never done   Lung Cancer Screening  04/12/2021   COVID-19 Vaccine (3 - 2024-25 season) 12/16/2022   INFLUENZA VACCINE  07/15/2023 (Originally 11/15/2022)   Pneumonia Vaccine 51+ Years old (1 of 2 - PCV) 01/18/2024 (Originally 02/05/1962)   HEMOGLOBIN A1C  07/31/2023   Diabetic kidney evaluation - eGFR measurement  01/18/2024   Medicare Annual Wellness (AWV)  03/27/2024   Colonoscopy  03/08/2027   DTaP/Tdap/Td (2 - Td or Tdap)  04/21/2030   Hepatitis C Screening  Completed   Zoster Vaccines- Shingrix  Completed   HPV VACCINES  Aged Out    Health Maintenance  Health Maintenance Due  Topic Date Due   FOOT EXAM  Never done   OPHTHALMOLOGY EXAM  Never done   Diabetic kidney evaluation - Urine ACR  Never done   Lung Cancer Screening  04/12/2021   COVID-19 Vaccine (3 - 2024-25 season) 12/16/2022    Colorectal cancer screening: Type of  screening: Colonoscopy. Completed 03/07/20. Repeat every 7 years  Lung Cancer Screening: (Low Dose CT Chest recommended if Age 57-80 years, 20 pack-year currently smoking OR have quit w/in 15years.) does qualify.   Lung Cancer Screening Referral: ordered 01/18/23  Additional Screening:  Hepatitis C Screening: does qualify; Completed 12/04/19  Vision Screening: Recommended annual ophthalmology exams for early detection of glaucoma and other disorders of the eye. Is the patient up to date with their annual eye exam?  Yes  Who is the provider or what is the name of the office in which the patient attends annual eye exams? Dr. Vanessa Barbara  If pt is not established with a provider, would they like to be referred to a provider to establish care? No .   Dental Screening: Recommended annual dental exams for proper oral hygiene  Diabetic Foot Exam: Diabetic Foot Exam: Overdue, Pt has been advised about the importance in completing this exam. Pt is scheduled for diabetic foot exam on at next office visit.  Community Resource Referral / Chronic Care Management: CRR required this visit?  No   CCM required this visit?  No    Plan:     I have personally reviewed and noted the following in the patient's chart:   Medical and social history Use of alcohol, tobacco or illicit drugs  Current medications and supplements including opioid prescriptions. Patient is not currently taking opioid prescriptions. Functional ability and status Nutritional status Physical activity Advanced directives List of other physicians Hospitalizations, surgeries, and ER visits in previous 12 months Vitals Screenings to include cognitive, depression, and falls Referrals and appointments  In addition, I have reviewed and discussed with patient certain preventive protocols, quality metrics, and best practice recommendations. A written personalized care plan for preventive services as well as general preventive health  recommendations were provided to patient.     Kandis Fantasia [place], [place]   [id number]   After Visit Summary: (In Person-Printed) AVS printed and given to the patient  Nurse Notes: No concerns at this time

## 2023-03-28 NOTE — Patient Instructions (Addendum)
Eric Key , Thank you for taking time to come for your Medicare Wellness Visit. I appreciate your ongoing commitment to your health goals. Please review the following plan we discussed and let me know if I can assist you in the future.   Referrals/Orders/Follow-Ups/Clinician Recommendations: Aim for 30 minutes of exercise or brisk walking, 6-8 glasses of water, and 5 servings of fruits and vegetables each day.  This is a list of the screening recommended for you and due dates:  Health Maintenance  Topic Date Due   Complete foot exam   Never done   Eye exam for diabetics  Never done   Yearly kidney health urinalysis for diabetes  Never done   Screening for Lung Cancer  04/12/2021   COVID-19 Vaccine (3 - 2024-25 season) 12/16/2022   Flu Shot  07/15/2023*   Pneumonia Vaccine (1 of 2 - PCV) 01/18/2024*   Hemoglobin A1C  07/31/2023   Yearly kidney function blood test for diabetes  01/18/2024   Medicare Annual Wellness Visit  03/27/2024   Colon Cancer Screening  03/08/2027   DTaP/Tdap/Td vaccine (2 - Td or Tdap) 04/21/2030   Hepatitis C Screening  Completed   Zoster (Shingles) Vaccine  Completed   HPV Vaccine  Aged Out  *Topic was postponed. The date shown is not the original due date.    Advanced directives: (ACP Link)Information on Advanced Care Planning can be found at [facility] Advance Health Care Directives Advance Health Care Directives ([email or web address]   Next Medicare Annual Wellness Visit scheduled for next year: Yes

## 2023-04-15 NOTE — Progress Notes (Signed)
 [facility] - Clinic Note  04/23/2023    CHIEF COMPLAINT Patient presents for Retina Follow Up  HISTORY OF PRESENT ILLNESS: Eric Key is a 67 y.o. male who presents to the clinic today for:   HPI     Retina Follow Up   Patient presents with  Wet AMD.  In left eye.  Severity is moderate.  Duration of 6 weeks.  Since onset it is stable.  I, the attending physician,  performed the HPI with the patient and updated documentation appropriately.        Comments   Patient states vision the same OU.      Last edited by [name], MD on 04/23/2023  1:07 PM.    Pt states vision is stable, he is using PF and Prolensa QID OS   Referring physician: [name], Fonda LABOR, MD [address] [place],  [place] [address]  HISTORICAL INFORMATION:   Selected notes from the MEDICAL RECORD NUMBER Referred by Dr. Deanne Mate for concern of mac heme OS.   CURRENT MEDICATIONS: Current Outpatient Medications (Ophthalmic Drugs)  Medication Sig   ketorolac  (ACULAR ) 0.5 % ophthalmic solution Place 1 drop into the left eye 4 (four) times daily.   prednisoLONE  acetate (PRED FORTE ) 1 % ophthalmic suspension Place 1 drop into the left eye 4 (four) times daily.   No current facility-administered medications for this visit. (Ophthalmic Drugs)   Current Outpatient Medications (Other)  Medication Sig   Continuous Glucose Sensor (FREESTYLE LIBRE 3 PLUS SENSOR) MISC 1 sensor change every 15 days.   metFORMIN  (GLUCOPHAGE ) 500 MG tablet Take 1 tablet (500 mg total) by mouth 2 (two) times daily with a meal.   OZEMPIC , 0.25 OR 0.5 MG/DOSE, 2 MG/3ML SOPN INJECT 0.25MG  SUBCUTANEOUSLY ONCE A WEEK FOR 28 DAYS THEN INJECT 0.5MG  ONCE A WEEK   rosuvastatin  (CRESTOR ) 5 MG tablet Take 1 tablet (5 mg total) by mouth daily.   buPROPion  (ZYBAN ) 150 MG 12 hr tablet Take 1 tablet (150 mg total) by mouth 2 (two) times daily.   No current facility-administered medications for this visit. (Other)    REVIEW OF SYSTEMS: ROS   Positive for: Endocrine, Eyes Negative for: Constitutional, Gastrointestinal, Neurological, Skin, Genitourinary, Musculoskeletal, HENT, Cardiovascular, Respiratory, Psychiatric, Allergic/Imm, Heme/Lymph Last edited by Verneda Howard D, COT on 04/23/2023  7:45 AM.        ALLERGIES Allergies  Allergen Reactions   Penicillins Hives   PAST MEDICAL HISTORY Past Medical History:  Diagnosis Date   Allergy    Penicillin   Cataract    Mixed form OU   History of adenomatous polyps of colon 03/16/2020   Hypertension    past hx    Hypertensive retinopathy    OU   Macular degeneration    Dry OD; Wet OS   Past Surgical History:  Procedure Laterality Date   ACHILLES TENDON REPAIR  2010   CHOLECYSTECTOMY     COLONOSCOPY     EYE SURGERY     GALLBLADDER SURGERY     FAMILY HISTORY Family History  Problem Relation Age of Onset   Diabetes Brother    Heart disease Brother    Diabetes Maternal Grandfather    Colon polyps Neg Hx    Colon cancer Neg Hx    Esophageal cancer Neg Hx    Rectal cancer Neg Hx    Stomach cancer Neg Hx    SOCIAL HISTORY Social History   Tobacco Use   Smoking status:  Some Days    Types: Cigars   Smokeless tobacco: Never  Vaping Use   Vaping status: Never Used  Substance Use Topics   Alcohol use: Not Currently    Comment: occ   Drug use: Not Currently       OPHTHALMIC EXAM: Base Eye Exam     Visual Acuity (Snellen - Linear)       Right Left   Dist cc 20/20 -2 20/30 -2   Dist ph cc NI NI    Correction: Glasses         Tonometry (Tonopen, 7:52 AM)       Right Left   Pressure 10 12         Pupils       Dark Light Shape React APD   Right 3 2 Round Brisk None   Left 3 2 Round Brisk None         Visual Fields (Counting fingers)       Left Right    Full Full         Extraocular Movement       Right Left    Full, Ortho Full, Ortho         Neuro/Psych     Oriented x3: Yes   Mood/Affect:  Normal         Dilation     Both eyes: 1.0% Mydriacyl, 2.5% Phenylephrine @ 7:52 AM           Slit Lamp and Fundus Exam     Slit Lamp Exam       Right Left   Lids/Lashes Dermatochalasis - upper lid Dermatochalasis - upper lid   Conjunctiva/Sclera White and quiet White and quiet   Cornea trace PEE, mild Arcus, tear film debris mild Arcus, 2+ fine punctate epithelial erosions inferiorly, tear film debris -- improved, well healed cataract wound   Anterior Chamber deep, clear, narrow angles deep, 0.5+cell/pigment   Iris Round and dilated, mild anterior bowing Round and moderately dilated, mild anterior bowing   Lens 2-3+ Nuclear sclerosis, 2-3+ Cortical cataract PC IOL in good position   Anterior Vitreous Vitreous syneresis Vitreous syneresis, fine cell/pigment         Fundus Exam       Right Left   Disc Pink and Sharp, focal PPP ST, Compact Pink and Sharp, Compact   C/D Ratio 0.2 0.4   Macula Flat, Drusen, RPE mottling and clumping, early Atrophy, punctate IRH -- fading abnormal foveal reflex, +CNV with heme stably improved, +central edema / cystic changes -- slightly improved, Drusen, RPE mottling and clumping, early subretinal fibrosis, punctate IRH - stably improved, no heme   Vessels attenuated, tortuous, perivascular pigment clumping peripherally, mild AV crossing changes attenuated, mild tortuousity, perivascular pigment clumping peripherally   Periphery Attached, peripheral pigmented CR scarring / atrophy and perivascular bone spicules 360, No heme Attached, peripheral pigmented CR scarring / atrophy and perivascular bone spicules 360, No heme           Refraction     Wearing Rx       Sphere Cylinder Axis Add   Right +0.25 +1.50 003 +2.00   Left +0.25 +0.75 169 +2.00           IMAGING AND PROCEDURES  Imaging and Procedures for @TODAY @  OCT, Retina - OU - Both Eyes       Right Eye Quality was good. Central Foveal Thickness: 300. Progression has been  stable. Findings include normal foveal contour, no  IRF, no SRF, retinal drusen , pigment epithelial detachment, outer retinal atrophy (Persistent vitreous opacities; stable low lying PEDs and ORA, partial PVD).   Left Eye Quality was good. Central Foveal Thickness: 390. Progression has been stable. Findings include abnormal foveal contour, retinal drusen , subretinal hyper-reflective material, intraretinal fluid, pigment epithelial detachment, outer retinal atrophy (Persistent IRF overlying stable low-lying PED / SRHM temporal fovea and macula).   Notes *Images captured and stored on drive  Diagnosis / Impression:  OD: non-exu ARMD w/ stable low lying PEDs and ORA OS: exu ARMD -- persistent IRF overlying stable low-lying PED / SRHM temporal fovea and macula  Clinical management:  See below  Abbreviations: NFP - Normal foveal profile. CME - cystoid macular edema. PED - pigment epithelial detachment. IRF - intraretinal fluid. SRF - subretinal fluid. EZ - ellipsoid zone. ERM - epiretinal membrane. ORA - outer retinal atrophy. ORT - outer retinal tubulation. SRHM - subretinal hyper-reflective material      Intravitreal Injection, Pharmacologic Agent - OS - Left Eye       Time Out 04/23/2023. 8:32 AM. Confirmed correct patient, procedure, site, and patient consented.   Anesthesia Topical anesthesia was used. Anesthetic medications included Lidocaine  2%, Proparacaine 0.5%.   Procedure Preparation included 5% betadine to ocular surface, eyelid speculum. A supplied (32g) needle was used.   Injection: 1.25 mg Bevacizumab  1.25mg /0.27ml   Route: Intravitreal, Site: Left Eye   NDC: [id number], Lot: [id number], Expiration date: 06/01/2023   Post-op Post injection exam found visual acuity of at least counting fingers. The patient tolerated the procedure well. There were no complications. The patient received written and verbal post procedure care education. Post injection medications were not  given.             ASSESSMENT/PLAN:   ICD-10-CM   1. Exudative age-related macular degeneration of left eye with active choroidal neovascularization (HCC)  H35.3221 OCT, Retina - OU - Both Eyes    Intravitreal Injection, Pharmacologic Agent - OS - Left Eye    Bevacizumab  (AVASTIN ) SOLN 1.25 mg    2. 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 Diabetes mellitus type 2 without retinopathy (HCC)  E11.9     5. Long term (current) use of oral hypoglycemic drugs  Z79.84     6. Long-term (current) use of injectable non-insulin antidiabetic drugs  Z79.85     7. Essential hypertension  I10     8. Hypertensive retinopathy of both eyes  H35.033     9. Combined forms of age-related cataract of right eye  H25.811     10. Pseudophakia  Z96.1     11. Dry eyes  H04.123      1. 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, OS  - s/p IVA OS #1 (03.04.21), #2 (03.04.21), #3 (04.30.21) -- IVA resistance  - s/p IVE OS #1 (06.04.21), #2 (07.08.21), #3 (08.09.21), #4 (09.13.21), #5 (10.18.21), #6 (11.24.21), #7 (12.31.21), #8 (02.11.22), #9 (03.25.22), #10 (05.02.22), #11 (06.06.22), #12 (07.13.22), #13 (08.17.22), #14 (09.21.22), #15 (10.26.22), #16 (11.29.22), #17 (01.03.23), #18 (02.07.23), #19 (03.16.23), #20 (04.20.23), #21 (05.19.23), #22 (06.20.23) -- IVE resistance  - s/p IVV OS #1 (07.18.23), #2 (08.15.23), #3 (09.12.23), #4 (10.10.23), #5 (11.14.23), #6 (12.21.23), #7 (01.25.24), #8 (02.29.24), #9 (04.11.24), #10 (05.20.24), #11 (07.01.24), #12 (08.13.24), #13 (10.01.24), #14 (11.26.24)  - exam shows central CNV with +subretinal heme -- improved  - BCVA improved to  20/30 from 20/40 OS   - OCT shows persistent IRF overlying stable low-lying PED / SRHM temporal fovea and macula at 6 weeks  - recommend switching back to IVA OS #4 today due to [employer] funding being unavailable, with follow up in 4-6  week  - pt in agreement and wishes to proceed with injection  - RBA of procedure discussed, questions answered - see procedure note  - IVV informed consent obtained and signed, 10.01.24  - cont PF and Prolensa QID OS for possible CME/post-op inflammatory component   - f/u 6 weeks -- DFE/OCT, possible injection  2. Age related macular degeneration, non-exudative, OD  - The incidence, anatomy, and pathology of dry AMD, risk of progression, and the AREDS and AREDS 2 study including smoking risks discussed with patient.   - recommend Amsler grid monitoring  3. Pigmentary retinopathy OU  - peripheral pigmented CR scarring and perivascular pigment clumping / bone spicules OU  - VA remains fairly good (20/20 OD, 20/40 OS with exudative ARMD OS)  - denies night blindness  - denies family history of vision problems  - discussed findings  - broad differential for pigmentary retinopathy:  Congenital (RP), infectious (TB, lyme dx, syphilis, toxo, bartonella), autoimmune and toxic etiologies  - labs checked (4.2.21):  All essential WNL   Rheumatoid factor   CBC   Comprehensive metabolic panel     RPR   ANA   ESR, CRP   ACE   ANCA   Toxoplasma antibodies IgG / IgM   Quantiferon - TB Gold Plus   HLA, A, B, C (IR)   Fluorescent treponemal ab (fla) - IgG - bld   VDRL Serum   Bartonella antibody, 06.04.21  - [facility] -- drawn on 6.4.21                         Results Positive for AGBL5, ALMS1, OPA1 -- unknown significance              - monitor  4-6. Diabetes mellitus, type 2 without retinopathy  - dx in October 2024 -- A1c 11.9 on 10.16.24  - on Ozempic  and metformin  - The incidence, risk factors for progression, natural history and treatment options for diabetic retinopathy  were discussed with patient.   - The need for close monitoring of blood glucose, blood pressure, and serum lipids, avoiding cigarette or any type of tobacco, and the need for long term follow up was also  discussed with patient. - f/u in 1 year, sooner prn  7,8. Hypertensive retinopathy OU  - discussed importance of tight BP control  - continue to monitor  9. Mixed form age related cataracts OD  - The symptoms of cataract, surgical options, and treatments and risks were discussed with patient.  - discussed diagnosis and progression  - under the expert management of Dr. Waylan   10. Pseudophakia OS  - s/p CE/IOL OS (Dr. Waylan, 10.23.24)  - IOL in good position  - OCT shows mild cystic changes - ?CME / post-op inflammatory component  - restarting PF and Prolensa QID OS as above  - monitor  11. Dry eyes OU - recommend artificial tears and lubricating ointment as needed   Ophthalmic Meds Ordered this visit:  Meds ordered this encounter  Medications   Bevacizumab  (AVASTIN ) SOLN 1.25 mg     Return for 4-6 wks - ex ARMD OS - Dilated Exam, OCT, Possible Injxn.  There are no Patient  Instructions on file for this visit.  This document serves as a record of services personally performed by Redell JUDITHANN Hans, MD, PhD. It was created on their behalf by Wanda GEANNIE Keens, COT an ophthalmic technician. The creation of this record is the provider's dictation and/or activities during the visit.    Electronically signed by:  Wanda GEANNIE Keens, COT  04/25/23 9:54 PM  This document serves as a record of services personally performed by Redell JUDITHANN Hans, MD, PhD. It was created on their behalf by Alan PARAS. Delores, OA an ophthalmic technician. The creation of this record is the provider's dictation and/or activities during the visit.    Electronically signed by: Alan PARAS. Delores, OA 04/25/23 9:54 PM  Redell JUDITHANN Hans, M.D., Ph.D. Diseases & Surgery of the Retina and Vitreous [facility]  I have reviewed the above documentation for accuracy and completeness, and I agree with the above. Redell JUDITHANN Hans, M.D., Ph.D. 04/25/23 10:07 PM   Abbreviations: M myopia (nearsighted); A  astigmatism; H hyperopia (farsighted); P presbyopia; Mrx spectacle prescription;  CTL contact lenses; OD right eye; OS left eye; OU both eyes  XT exotropia; ET esotropia; PEK punctate epithelial keratitis; PEE punctate epithelial erosions; DES dry eye syndrome; MGD meibomian gland dysfunction; ATs artificial tears; PFAT's preservative free artificial tears; NSC nuclear sclerotic cataract; PSC posterior subcapsular cataract; ERM epi-retinal membrane; PVD posterior vitreous detachment; RD retinal detachment; DM diabetes mellitus; DR diabetic retinopathy; NPDR non-proliferative diabetic retinopathy; PDR proliferative diabetic retinopathy; CSME clinically significant macular edema; DME diabetic macular edema; dbh dot blot hemorrhages; CWS cotton wool spot; POAG primary open angle glaucoma; C/D cup-to-disc ratio; HVF humphrey visual field; GVF goldmann visual field; OCT optical coherence tomography; IOP intraocular pressure; BRVO Branch retinal vein occlusion; CRVO central retinal vein occlusion; CRAO central retinal artery occlusion; BRAO branch retinal artery occlusion; RT retinal tear; SB scleral buckle; PPV pars plana vitrectomy; VH Vitreous hemorrhage; PRP panretinal laser photocoagulation; IVK intravitreal kenalog; VMT vitreomacular traction; MH Macular hole;  NVD neovascularization of the disc; NVE neovascularization elsewhere; AREDS age related eye disease study; ARMD age related macular degeneration; POAG primary open angle glaucoma; EBMD epithelial/anterior basement membrane dystrophy; ACIOL anterior chamber intraocular lens; IOL intraocular lens; PCIOL posterior chamber intraocular lens; Phaco/IOL phacoemulsification with intraocular lens placement; PRK photorefractive keratectomy; LASIK laser assisted in situ keratomileusis; HTN hypertension; DM diabetes mellitus; COPD chronic obstructive pulmonary disease

## 2023-04-20 ENCOUNTER — Other Ambulatory Visit: Payer: Self-pay | Admitting: Family Medicine

## 2023-04-20 DIAGNOSIS — E119 Type 2 diabetes mellitus without complications: Secondary | ICD-10-CM

## 2023-04-22 ENCOUNTER — Other Ambulatory Visit: Payer: Self-pay | Admitting: Emergency Medicine

## 2023-04-22 ENCOUNTER — Telehealth: Payer: Self-pay | Admitting: Acute Care

## 2023-04-22 DIAGNOSIS — Z122 Encounter for screening for malignant neoplasm of respiratory organs: Secondary | ICD-10-CM

## 2023-04-22 DIAGNOSIS — F17210 Nicotine dependence, cigarettes, uncomplicated: Secondary | ICD-10-CM

## 2023-04-22 DIAGNOSIS — Z87891 Personal history of nicotine dependence: Secondary | ICD-10-CM

## 2023-04-22 NOTE — Telephone Encounter (Signed)
 Lung Cancer Screening Narrative/Criteria Questionnaire (Cigarette Smokers Only- No Cigars/Pipes/vapes)   Eric Key     SDMV:04/30/2023  Feb 09, 1956                          LDCT: 05/09/2023    68 y.o.   Phone: [phone]  Lung Screening Narrative   Before calling, confirm age (50-77 yrs Medicare / 50-80 yrs Private pay insurance)  Insurance information: Medicare [employer]    I am calling at the request of Dr. Fonda [name] (referring provider) to schedule you for a lung screening.  Did your provider discuss this with you? Yes   This screening involves an initial meeting with our NP, who is the Nurse Navigator for the program.  It is called a shared decision making visit.  The initial meeting is required by insurance and Medicare to make sure you understand the program.  This appointment takes about 20 minutes to complete.  After you have spoken with the provider, we will schedule you for your screening scan.  This scan takes about 5-10 minutes to complete.  You can eat and drink normally before and after the scan.    I am going to ask you a few questions to make sure you meet the criteria to participate in the program.    Are you a current or former smoker? Current Age began smoking: 21   If you are a former smoker, what year did you quit smoking? N/A(must be within 15 yrs) - Quit for 5 years within smoking history but began again.     To calculate your smoking history, I need an accurate estimate of how many packs of cigarettes you smoked per day and   for how many years.    Years smoking 41 x Packs per day 3/4 = Pack years 30.75   (at least 20 pack yrs)   (Make sure they understand that we need to know how much they have smoked in the past, not just the number of PPD they are smoking now)  Do you have a personal history of cancer?  No    Do you have a family history of cancer? No   Are you having any of the following symptoms?  Coughing up blood?  No  Weight loss of 15 lbs  or more in the last 6 months without trying / you cannot explain  No  It looks like you meet all criteria.  When would be a good time for us  to schedule you for this screening?   Additional information: N/A

## 2023-04-23 ENCOUNTER — Ambulatory Visit (INDEPENDENT_AMBULATORY_CARE_PROVIDER_SITE_OTHER): Payer: Medicare PPO | Admitting: Ophthalmology

## 2023-04-23 ENCOUNTER — Encounter (INDEPENDENT_AMBULATORY_CARE_PROVIDER_SITE_OTHER): Payer: Self-pay | Admitting: Ophthalmology

## 2023-04-23 DIAGNOSIS — H35033 Hypertensive retinopathy, bilateral: Secondary | ICD-10-CM

## 2023-04-23 DIAGNOSIS — H353112 Nonexudative age-related macular degeneration, right eye, intermediate dry stage: Secondary | ICD-10-CM

## 2023-04-23 DIAGNOSIS — E119 Type 2 diabetes mellitus without complications: Secondary | ICD-10-CM | POA: Diagnosis not present

## 2023-04-23 DIAGNOSIS — I10 Essential (primary) hypertension: Secondary | ICD-10-CM | POA: Diagnosis not present

## 2023-04-23 DIAGNOSIS — Z7984 Long term (current) use of oral hypoglycemic drugs: Secondary | ICD-10-CM

## 2023-04-23 DIAGNOSIS — H353221 Exudative age-related macular degeneration, left eye, with active choroidal neovascularization: Secondary | ICD-10-CM | POA: Diagnosis not present

## 2023-04-23 DIAGNOSIS — H04123 Dry eye syndrome of bilateral lacrimal glands: Secondary | ICD-10-CM

## 2023-04-23 DIAGNOSIS — H3552 Pigmentary retinal dystrophy: Secondary | ICD-10-CM | POA: Diagnosis not present

## 2023-04-23 DIAGNOSIS — Z7985 Long-term (current) use of injectable non-insulin antidiabetic drugs: Secondary | ICD-10-CM | POA: Diagnosis not present

## 2023-04-23 DIAGNOSIS — H25811 Combined forms of age-related cataract, right eye: Secondary | ICD-10-CM | POA: Diagnosis not present

## 2023-04-23 DIAGNOSIS — Z961 Presence of intraocular lens: Secondary | ICD-10-CM

## 2023-04-23 LAB — HM DIABETES FOOT EXAM: HM Diabetic Foot Exam: NORMAL

## 2023-04-23 MED ORDER — BEVACIZUMAB CHEMO INJECTION 1.25MG/0.05ML SYRINGE FOR KALEIDOSCOPE
1.2500 mg | INTRAVITREAL | Status: AC | PRN
  Administered 2023-04-23: 1.25 mg via INTRAVITREAL

## 2023-04-24 IMAGING — DX DG CHEST 2V
2 series · 2 of 2 positions shown · non-contrast
Comparison: None.

CLINICAL DATA: 64-year-old male with cough dyspnea and COPD

EXAM:
CHEST - 2 VIEW

[chest pa]
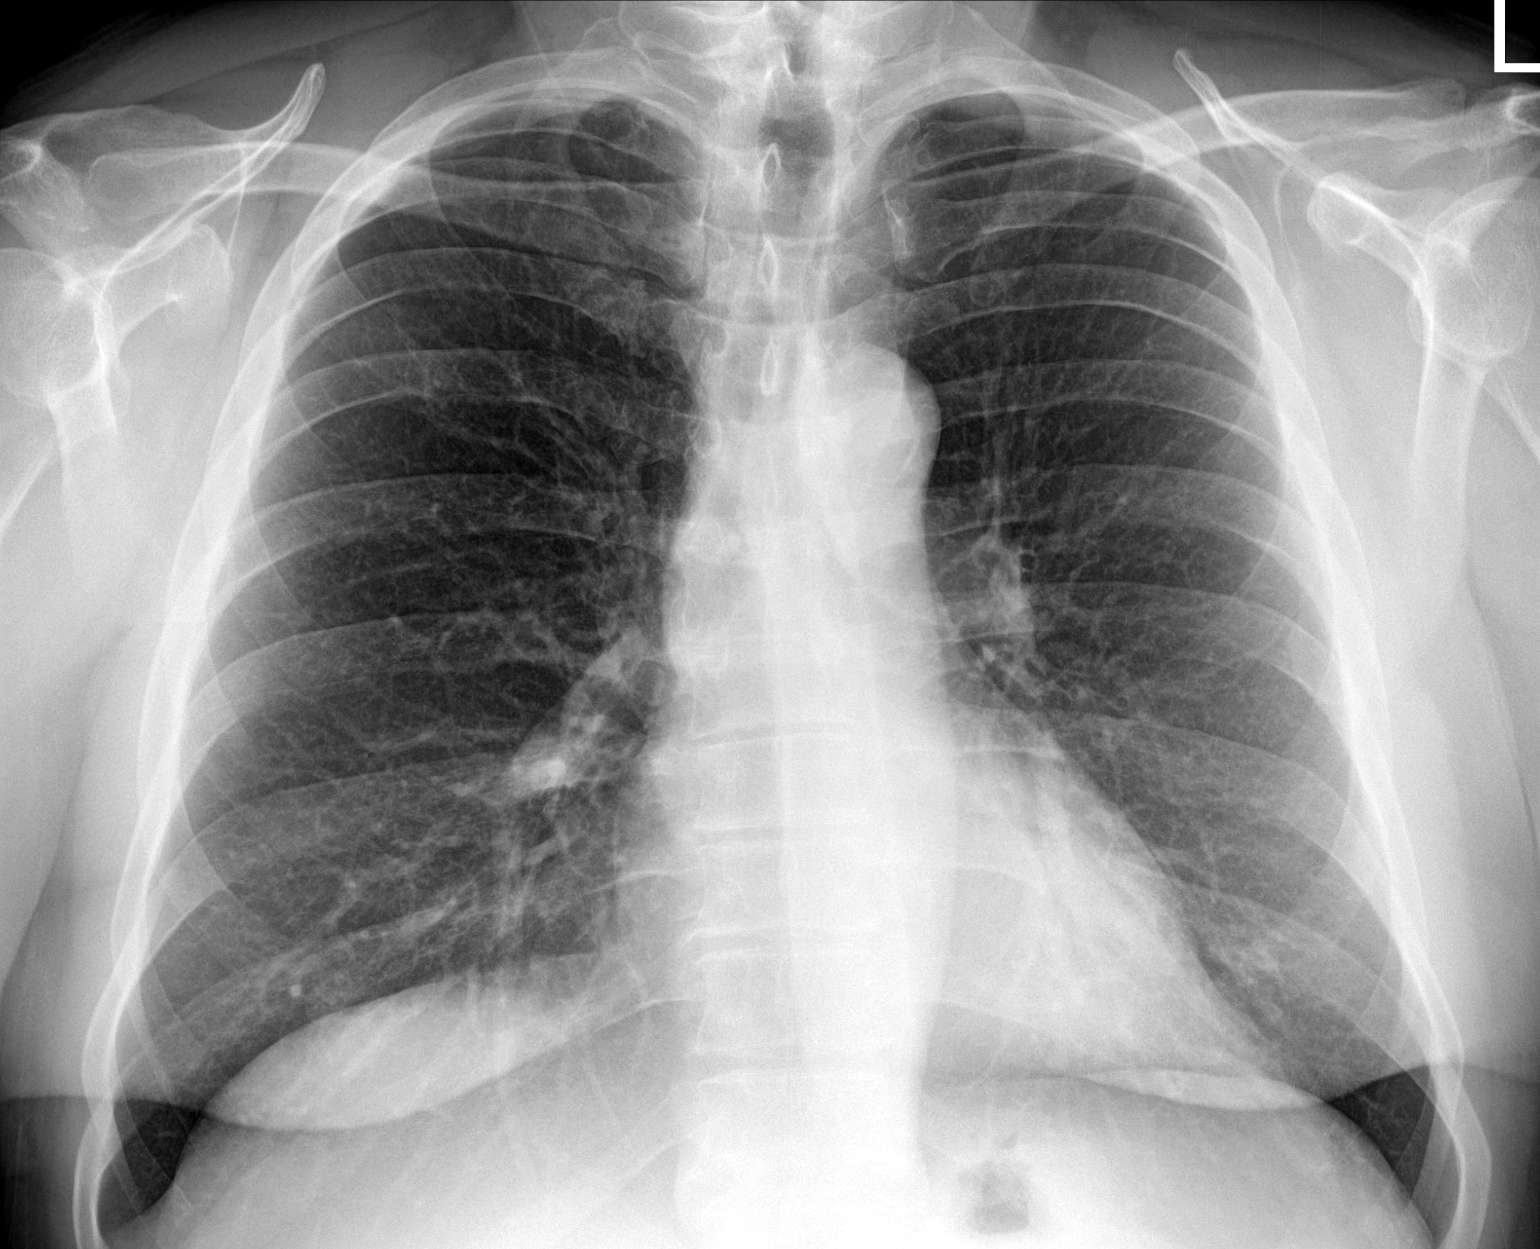

[chest lat]
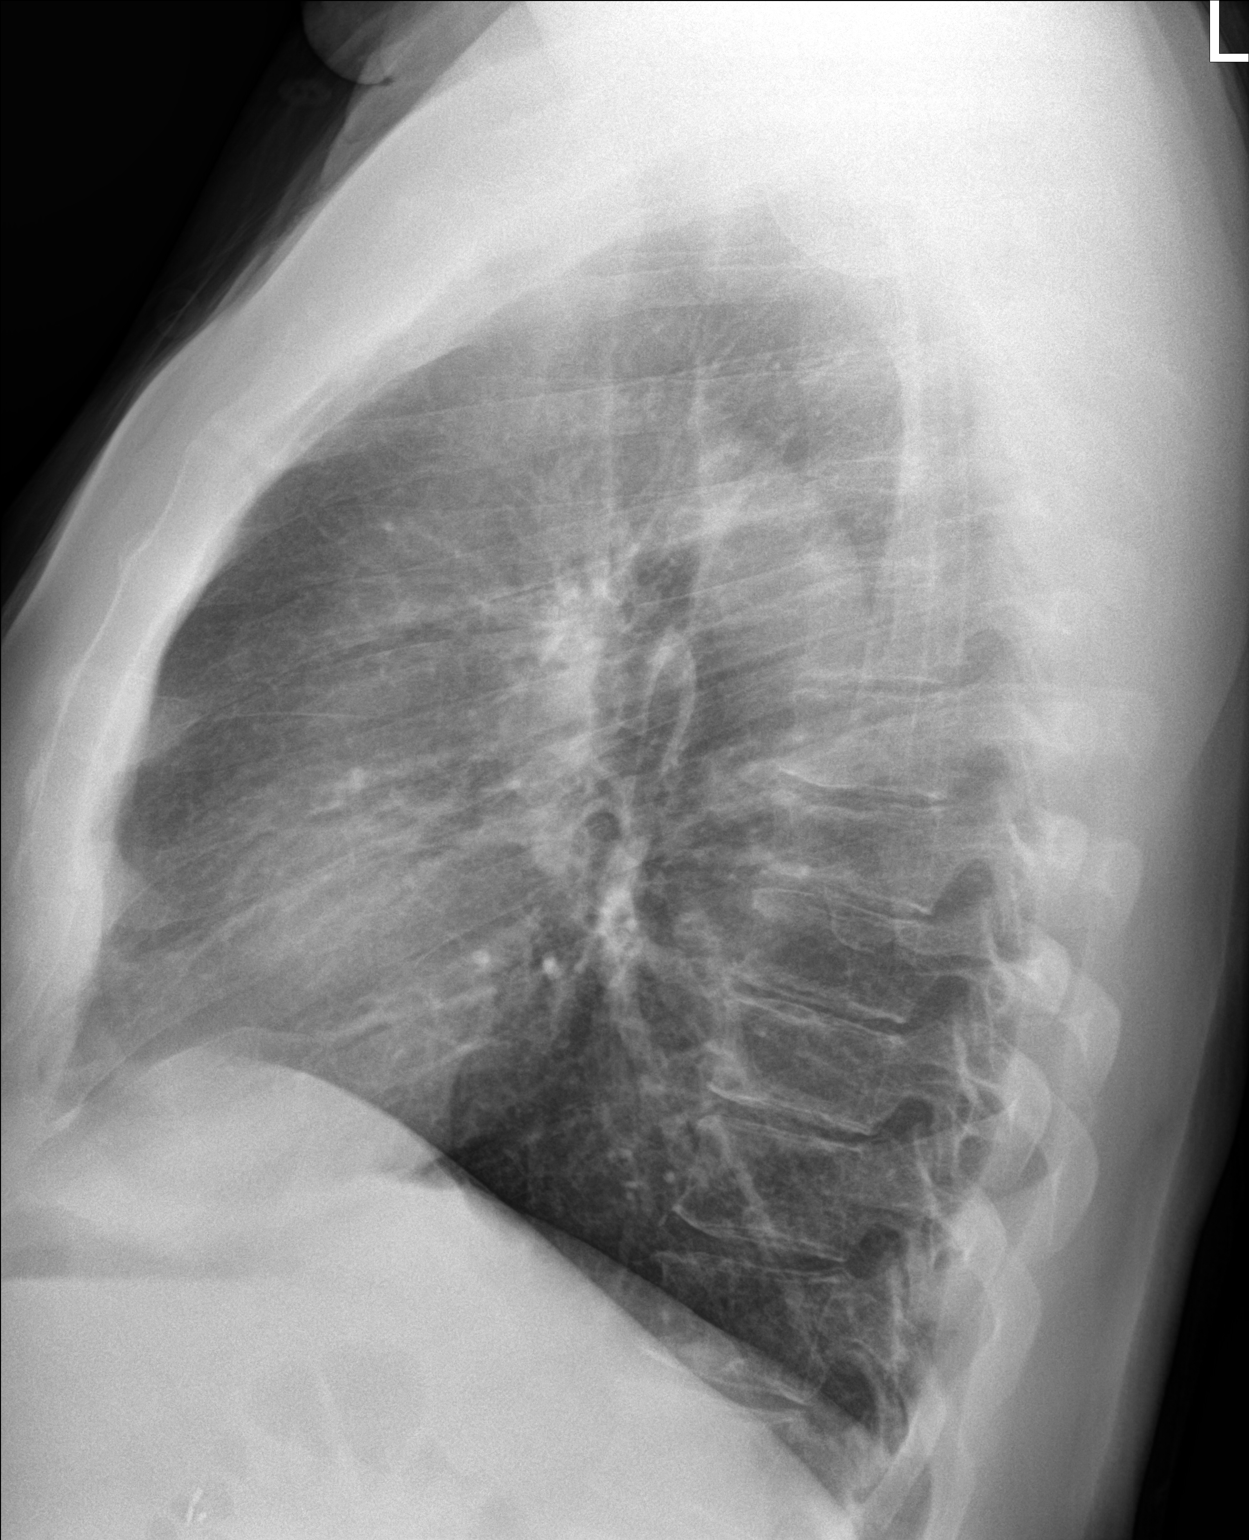

[2 of 2 positions shown; findings below may reference images not displayed]

FINDINGS: Cardiomediastinal silhouette within normal limits in size and
contour. No evidence of central vascular congestion. No interlobular
septal thickening.

No pneumothorax or pleural effusion. Coarsened interstitial
markings, with no confluent airspace disease.

No acute displaced fracture. Degenerative changes of the spine.
IMPRESSION: No active cardiopulmonary disease.

## 2023-04-30 ENCOUNTER — Encounter: Payer: Self-pay | Admitting: Physician Assistant

## 2023-04-30 ENCOUNTER — Ambulatory Visit (INDEPENDENT_AMBULATORY_CARE_PROVIDER_SITE_OTHER): Payer: Medicare PPO | Admitting: Physician Assistant

## 2023-04-30 DIAGNOSIS — F17210 Nicotine dependence, cigarettes, uncomplicated: Secondary | ICD-10-CM

## 2023-04-30 NOTE — Patient Instructions (Signed)

## 2023-04-30 NOTE — Progress Notes (Signed)
 Virtual Visit via Telephone Note  I connected with Eric Key on 04/30/23 at  9:16 AM by telephone and verified that I am speaking with the correct person using two identifiers.  Location: Patient: home Provider: working virtually from home   I discussed the limitations, risks, security and privacy concerns of performing an evaluation and management service by telephone and the availability of in person appointments. I also discussed with the patient that there may be a patient responsible charge related to this service. The patient expressed understanding and agreed to proceed.     Shared Decision Making Visit Lung Cancer Screening Program [phone])   Eligibility: Age 68 Pack Years Smoking History Calculation 20 (# packs/per year x # years smoked) Recent History of coughing up blood  No Unexplained weight loss? No ( >Than 15 pounds within the last 6 months ) Prior History Lung / other cancer No (Diagnosis within the last 5 years already requiring surveillance chest CT Scans). Smoking Status Current Smoker  Visit Components: Discussion included one or more decision making aids. Yes Discussion included risk/benefits of screening. Yes Discussion included potential follow up diagnostic testing for abnormal scans. Yes Discussion included meaning and risk of over diagnosis. Yes Discussion included meaning and risk of False Positives. Yes Discussion included meaning of total radiation exposure. Yes  Counseling Included: Importance of adherence to annual lung cancer LDCT screening. Yes Impact of comorbidities on ability to participate in the program. Yes Ability and willingness to under diagnostic treatment: Yes  Smoking Cessation Counseling: Current Smokers:  Discussed importance of smoking cessation. Yes Information about tobacco cessation classes and interventions provided to patient. Yes Symptomatic Patient. No Diagnosis Code: Tobacco Use Z72.0 Asymptomatic Patient  Yes  Counseling (Intermediate counseling: > three minutes counseling) H9563 Information about tobacco cessation classes and interventions provided to patient. Yes Written Order for Lung Cancer Screening with LDCT placed in Epic. Yes (CT Chest Lung Cancer Screening Low Dose W/O CM) [id number] Z12.2-Screening of respiratory organs Z87.891-Personal history of nicotine dependence   I have spent 25 minutes of face to face/ virtual visit  time with the patient discussing the risks and benefits of lung cancer screening. We discussed the above noted topics. We paused at intervals to allow for questions to be asked and answered to ensure understanding.We discussed that the single most powerful action that anyone can take to decrease their risk of developing lung cancer is to quit smoking.  We discussed options for tools to aid in quitting smoking including nicotine replacement therapy, non-nicotine medications, support groups, Quit Smart classes, and behavior modification. We discussed that often times setting smaller, more achievable goals, such as eliminating 1 cigarette a day for a week and then 2 cigarettes a day for a week can be helpful in slowly decreasing the number of cigarettes smoked. I provided  them  with smoking cessation  information  with contact information for community resources, classes, free nicotine replacement therapy, and access to mobile apps, text messaging, and on-line smoking cessation help. I have also provided  them  the office contact information in the event they have any questions. We discussed the time and location of the scan, and that either Karna Curly RN, Karna Doom, RN  or I will call / send a letter with the results within 24-72 hours of receiving them. The patient verbalized understanding of all of  the above and had no further questions upon leaving the office. They have my contact information in the event they have any further  questions.  I spent 3 minutes counseling  on smoking cessation and the health risks of continued tobacco abuse.  I explained to the patient that there has been a high incidence of coronary artery disease noted on these exams. I explained that this is a non-gated exam therefore degree or severity cannot be determined. This patient is on statin therapy. I have asked the patient to follow-up with their PCP regarding any incidental finding of coronary artery disease and management with diet or medication as their PCP  feels is clinically indicated. The patient verbalized understanding of the above and had no further questions upon completion of the visit.    Eric Key Eric Krichbaum, PA-C

## 2023-05-08 NOTE — Progress Notes (Signed)
 [facility] - Clinic Note  05/21/2023    CHIEF COMPLAINT Patient presents for Retina Follow Up  HISTORY OF PRESENT ILLNESS: Eric Key is a 68 y.o. male who presents to the clinic today for:   HPI     Retina Follow Up   Patient presents with  Wet AMD.  In left eye.  Severity is moderate.  Duration of 4 weeks.  Since onset it is stable.  I, the attending physician,  performed the HPI with the patient and updated documentation appropriately.        Comments   Patient states the vision is the same. He is using Ketorolac  and Pred OS QID - BID depends on the day. His blood sugar was 101.      Last edited by [name], MD on 05/21/2023  8:58 AM.    Pt states he has not noticed any change in vision   Referring physician: [name], Fonda LABOR, MD [address] [place],  [place] [address]  HISTORICAL INFORMATION:   Selected notes from the MEDICAL RECORD NUMBER Referred by Dr. Deanne Mate for concern of mac heme OS.   CURRENT MEDICATIONS: Current Outpatient Medications (Ophthalmic Drugs)  Medication Sig   ketorolac  (ACULAR ) 0.5 % ophthalmic solution Place 1 drop into the left eye 4 (four) times daily.   prednisoLONE  acetate (PRED FORTE ) 1 % ophthalmic suspension Place 1 drop into the left eye 4 (four) times daily.   No current facility-administered medications for this visit. (Ophthalmic Drugs)   Current Outpatient Medications (Other)  Medication Sig   Continuous Glucose Sensor (FREESTYLE LIBRE 3 PLUS SENSOR) MISC 1 sensor change every 15 days.   OZEMPIC , 0.25 OR 0.5 MG/DOSE, 2 MG/3ML SOPN INJECT 0.25MG  SUBCUTANEOUSLY ONCE A WEEK FOR 28 DAYS THEN INJECT 0.5MG  ONCE A WEEK   rosuvastatin  (CRESTOR ) 5 MG tablet Take 1 tablet (5 mg total) by mouth daily.   No current facility-administered medications for this visit. (Other)   REVIEW OF SYSTEMS: ROS   Positive for: Endocrine, Eyes Negative for: Constitutional, Gastrointestinal, Neurological, Skin,  Genitourinary, Musculoskeletal, HENT, Cardiovascular, Respiratory, Psychiatric, Allergic/Imm, Heme/Lymph Last edited by Myra Wanda SAILOR, COT on 05/21/2023  7:52 AM.     ALLERGIES Allergies  Allergen Reactions   Penicillins Hives   PAST MEDICAL HISTORY Past Medical History:  Diagnosis Date   Allergy    Penicillin   Cataract    Mixed form OU   History of adenomatous polyps of colon 03/16/2020   Hypertension    past hx    Hypertensive retinopathy    OU   Macular degeneration    Dry OD; Wet OS   Past Surgical History:  Procedure Laterality Date   ACHILLES TENDON REPAIR  2010   CHOLECYSTECTOMY     COLONOSCOPY     EYE SURGERY     GALLBLADDER SURGERY     FAMILY HISTORY Family History  Problem Relation Age of Onset   Diabetes Brother    Heart disease Brother    Diabetes Maternal Grandfather    Colon polyps Neg Hx    Colon cancer Neg Hx    Esophageal cancer Neg Hx    Rectal cancer Neg Hx    Stomach cancer Neg Hx    SOCIAL HISTORY Social History   Tobacco Use   Smoking status: Some Days    Current packs/day: 0.75    Average packs/day: 0.8 packs/day for 41.0 years (30.8 ttl pk-yrs)    Types: Cigars, Cigarettes  Smokeless tobacco: Never  Vaping Use   Vaping status: Never Used  Substance Use Topics   Alcohol use: Not Currently    Comment: occ   Drug use: Not Currently       OPHTHALMIC EXAM: Base Eye Exam     Visual Acuity (Snellen - Linear)       Right Left   Dist cc 20/20 20/40 +1   Dist ph cc  NI    Correction: Glasses         Tonometry (Tonopen, 7:56 AM)       Right Left   Pressure 15 13         Pupils       Dark Light Shape React APD   Right 3 2 Round Brisk None   Left 3 2 Round Brisk None         Visual Fields       Left Right    Full Full         Extraocular Movement       Right Left    Full, Ortho Full, Ortho         Neuro/Psych     Oriented x3: Yes   Mood/Affect: Normal         Dilation     Both eyes:  1.0% Mydriacyl, 2.5% Phenylephrine @ 7:53 AM           Slit Lamp and Fundus Exam     Slit Lamp Exam       Right Left   Lids/Lashes Dermatochalasis - upper lid Dermatochalasis - upper lid   Conjunctiva/Sclera White and quiet White and quiet   Cornea trace PEE, mild Arcus, tear film debris mild Arcus, 2+ fine punctate epithelial erosions inferiorly, tear film debris -- improved, well healed cataract wound   Anterior Chamber deep, clear, narrow angles deep, 0.5+cell/pigment   Iris Round and dilated, mild anterior bowing Round and moderately dilated, mild anterior bowing   Lens 2-3+ Nuclear sclerosis, 2-3+ Cortical cataract PC IOL in good position   Anterior Vitreous Vitreous syneresis Vitreous syneresis, fine cell/pigment         Fundus Exam       Right Left   Disc Pink and Sharp, focal PPP ST, Compact Pink and Sharp, Compact   C/D Ratio 0.2 0.4   Macula Flat, Drusen, RPE mottling and clumping, early Atrophy, punctate IRH -- fading abnormal foveal reflex, +CNV with heme stably improved, +central edema / cystic changes -- slightly increased, Drusen, RPE mottling and clumping, early subretinal fibrosis, punctate IRH - stably improved, no heme   Vessels attenuated, tortuous, perivascular pigment clumping peripherally, mild AV crossing changes attenuated, mild tortuousity, perivascular pigment clumping peripherally   Periphery Attached, peripheral pigmented CR scarring / atrophy and perivascular bone spicules 360, No heme Attached, peripheral pigmented CR scarring / atrophy and perivascular bone spicules 360, No heme           Refraction     Wearing Rx       Sphere Cylinder Axis Add   Right +0.25 +1.50 003 +2.00   Left +0.25 +0.75 169 +2.00           IMAGING AND PROCEDURES  Imaging and Procedures for @TODAY @  OCT, Retina - OU - Both Eyes       Right Eye Quality was good. Central Foveal Thickness: 296. Progression has been stable. Findings include normal foveal  contour, no IRF, no SRF, retinal drusen , pigment epithelial detachment, outer retinal atrophy (  Persistent vitreous opacities; stable low lying PEDs and ORA, partial PVD).   Left Eye Quality was good. Central Foveal Thickness: 493. Progression has worsened. Findings include abnormal foveal contour, retinal drusen , subretinal hyper-reflective material, intraretinal fluid, pigment epithelial detachment, outer retinal atrophy (Interval increase in IRF overlying stable low-lying PED / [facility] temporal fovea and macula).   Notes *Images captured and stored on drive  Diagnosis / Impression:  OD: non-exu ARMD w/ stable low lying PEDs and ORA OS: exu ARMD -- interval increase in IRF overlying stable low-lying PED / [facility] temporal fovea and macula  Clinical management:  See below  Abbreviations: NFP - Normal foveal profile. CME - cystoid macular edema. PED - pigment epithelial detachment. IRF - intraretinal fluid. SRF - subretinal fluid. EZ - ellipsoid zone. ERM - epiretinal membrane. ORA - outer retinal atrophy. ORT - outer retinal tubulation. SRHM - subretinal hyper-reflective material      Intravitreal Injection, Pharmacologic Agent - OS - Left Eye       Time Out 05/21/2023. 8:47 AM. Confirmed correct patient, procedure, site, and patient consented.   Anesthesia Topical anesthesia was used. Anesthetic medications included Lidocaine  2%, Proparacaine 0.5%.   Procedure Preparation included 5% betadine to ocular surface, eyelid speculum. A (32g) needle was used.   Injection: 6 mg faricimab -svoa 6 MG/0.05ML (Patient supplied)   Route: Intravitreal, Site: Left Eye   NDC: 49757-903-98, Lot: [id number], Expiration date: 12/14/2024, Waste: 0 mL   Post-op Post injection exam found visual acuity of at least counting fingers. The patient tolerated the procedure well. There were no complications. The patient received written and verbal post procedure care education. Post injection medications were not  given.   Notes **SAMPLE MEDICATION ADMINISTERED**           ASSESSMENT/PLAN:   ICD-10-CM   1. Exudative age-related macular degeneration of left eye with active choroidal neovascularization (HCC)  H35.3221 OCT, Retina - OU - Both Eyes    Intravitreal Injection, Pharmacologic Agent - OS - Left Eye    faricimab -svoa (VABYSMO ) 6mg /0.74mL intravitreal injection    2. 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 Diabetes mellitus type 2 without retinopathy (HCC)  E11.9     5. Long term (current) use of oral hypoglycemic drugs  Z79.84     6. Long-term (current) use of injectable non-insulin antidiabetic drugs  Z79.85     7. Essential hypertension  I10     8. Hypertensive retinopathy of both eyes  H35.033     9. Combined forms of age-related cataract of right eye  H25.811     10. Pseudophakia  Z96.1     11. Dry eyes  H04.123      1. 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, OS  - s/p IVA OS #1 (03.04.21), #2 (03.04.21), #3 (04.30.21), #4 (01.07.25)  - s/p IVE OS #1 (06.04.21), #2 (07.08.21), #3 (08.09.21), #4 (09.13.21), #5 (10.18.21), #6 (11.24.21), #7 (12.31.21), #8 (02.11.22), #9 (03.25.22), #10 (05.02.22), #11 (06.06.22), #12 (07.13.22), #13 (08.17.22), #14 (09.21.22), #15 (10.26.22), #16 (11.29.22), #17 (01.03.23), #18 (02.07.23), #19 (03.16.23), #20 (04.20.23), #21 (05.19.23), #22 (06.20.23) -- IVE resistance  - s/p IVV OS #1 (07.18.23), #2 (08.15.23), #3 (09.12.23), #4 (10.10.23), #5 (11.14.23), #6 (12.21.23), #7 (01.25.24), #8 (02.29.24), #9 (04.11.24), #10 (05.20.24), #11 (07.01.24), #12 (08.13.24), #13 (10.01.24), #14 (11.26.24)  - exam shows central CNV with +subretinal heme -- improved  - BCVA OS 20/40 from  20/30  - OCT shows interval increase in IRF overlying stable low-lying PED / [facility] temporal fovea and macula at 4 weeks -- IVA resistance  - recommend IVV OS #15 (SAMPLE) today  02.04.25 with follow up in 4 weeks  - pt in agreement and wishes to proceed with injection  - RBA of procedure discussed, questions answered - see procedure note  - IVV informed consent obtained and signed, 10.01.24  - cont PF and Prolensa QID OS for possible CME/post-op inflammatory component   - f/u 4 weeks -- DFE/OCT, possible injection  2. Age related macular degeneration, non-exudative, OD  - The incidence, anatomy, and pathology of dry AMD, risk of progression, and the AREDS and AREDS 2 study including smoking risks discussed with patient.   - recommend Amsler grid monitoring  3. Pigmentary retinopathy OU  - peripheral pigmented CR scarring and perivascular pigment clumping / bone spicules OU  - VA remains fairly good (20/20 OD, 20/40 OS with exudative ARMD OS)  - denies night blindness  - denies family history of vision problems  - discussed findings  - broad differential for pigmentary retinopathy:  Congenital (RP), infectious (TB, lyme dx, syphilis, toxo, bartonella), autoimmune and toxic etiologies  - labs checked (4.2.21):  All essential WNL   Rheumatoid factor   CBC   Comprehensive metabolic panel     RPR   ANA   ESR, CRP   ACE   ANCA   Toxoplasma antibodies IgG / IgM   Quantiferon - TB Gold Plus   HLA, A, B, C (IR)   Fluorescent treponemal ab (fla) - IgG - bld   VDRL Serum   Bartonella antibody, 06.04.21  - [facility] -- drawn on 6.4.21                         Results Positive for AGBL5, ALMS1, OPA1 -- unknown significance              - monitor  4-6. Diabetes mellitus, type 2 without retinopathy  - dx in October 2024 -- A1c 11.9 on 10.16.24  - on Ozempic  and metformin  - The incidence, risk factors for progression, natural history and treatment options for diabetic retinopathy  were discussed with patient.   - The need for close monitoring of blood glucose, blood pressure, and serum lipids, avoiding cigarette or any type of tobacco, and the need for  long term follow up was also discussed with patient. - f/u in 1 year, sooner prn  7,8. Hypertensive retinopathy OU  - discussed importance of tight BP control  - continue to monitor  9. Mixed form age related cataracts OD  - The symptoms of cataract, surgical options, and treatments and risks were discussed with patient.  - discussed diagnosis and progression  - under the expert management of Dr. Waylan   10. Pseudophakia OS  - s/p CE/IOL OS (Dr. Waylan, 10.23.24)  - IOL in good position  - OCT shows mild cystic changes - ?CME / post-op inflammatory component  - restarting PF and Prolensa QID OS as above  - monitor  11. Dry eyes OU - recommend artificial tears and lubricating ointment as needed   Ophthalmic Meds Ordered this visit:  Meds ordered this encounter  Medications   faricimab -svoa (VABYSMO ) 6mg /0.58mL intravitreal injection     Return in about 4 weeks (around 06/18/2023) for f/u exu ARMD OS, DFE, OCT.  There are no Patient Instructions on file for this visit.  This document serves as a record of services personally performed by Redell JUDITHANN Hans, MD, PhD. It was created on their behalf by Wanda GEANNIE Keens, COT an ophthalmic technician. The creation of this record is the provider's dictation and/or activities during the visit.    Electronically signed by:  Wanda GEANNIE Keens, COT  05/21/23 1:33 PM  This document serves as a record of services personally performed by Redell JUDITHANN Hans, MD, PhD. It was created on their behalf by Alan PARAS. Delores, OA an ophthalmic technician. The creation of this record is the provider's dictation and/or activities during the visit.    Electronically signed by: Alan PARAS. Delores, OA 05/21/23 1:33 PM  Redell JUDITHANN Hans, M.D., Ph.D. Diseases & Surgery of the Retina and Vitreous [facility]  I have reviewed the above documentation for accuracy and completeness, and I agree with the above. Redell JUDITHANN Hans, M.D., Ph.D.  05/21/23 1:35 PM   Abbreviations: M myopia (nearsighted); A astigmatism; H hyperopia (farsighted); P presbyopia; Mrx spectacle prescription;  CTL contact lenses; OD right eye; OS left eye; OU both eyes  XT exotropia; ET esotropia; PEK punctate epithelial keratitis; PEE punctate epithelial erosions; DES dry eye syndrome; MGD meibomian gland dysfunction; ATs artificial tears; PFAT's preservative free artificial tears; NSC nuclear sclerotic cataract; PSC posterior subcapsular cataract; ERM epi-retinal membrane; PVD posterior vitreous detachment; RD retinal detachment; DM diabetes mellitus; DR diabetic retinopathy; NPDR non-proliferative diabetic retinopathy; PDR proliferative diabetic retinopathy; CSME clinically significant macular edema; DME diabetic macular edema; dbh dot blot hemorrhages; CWS cotton wool spot; POAG primary open angle glaucoma; C/D cup-to-disc ratio; HVF humphrey visual field; GVF goldmann visual field; OCT optical coherence tomography; IOP intraocular pressure; BRVO Branch retinal vein occlusion; CRVO central retinal vein occlusion; CRAO central retinal artery occlusion; BRAO branch retinal artery occlusion; RT retinal tear; SB scleral buckle; PPV pars plana vitrectomy; VH Vitreous hemorrhage; PRP panretinal laser photocoagulation; IVK intravitreal kenalog; VMT vitreomacular traction; MH Macular hole;  NVD neovascularization of the disc; NVE neovascularization elsewhere; AREDS age related eye disease study; ARMD age related macular degeneration; POAG primary open angle glaucoma; EBMD epithelial/anterior basement membrane dystrophy; ACIOL anterior chamber intraocular lens; IOL intraocular lens; PCIOL posterior chamber intraocular lens; Phaco/IOL phacoemulsification with intraocular lens placement; PRK photorefractive keratectomy; LASIK laser assisted in situ keratomileusis; HTN hypertension; DM diabetes mellitus; COPD chronic obstructive pulmonary disease

## 2023-05-09 ENCOUNTER — Other Ambulatory Visit (HOSPITAL_COMMUNITY): Payer: Self-pay

## 2023-05-09 ENCOUNTER — Telehealth: Payer: Self-pay

## 2023-05-09 ENCOUNTER — Ambulatory Visit
Admission: RE | Admit: 2023-05-09 | Discharge: 2023-05-09 | Disposition: A | Discharge status: Home / Self Care | Payer: Medicare PPO | Source: Ambulatory Visit | Attending: Acute Care | Admitting: Acute Care

## 2023-05-09 DIAGNOSIS — Z122 Encounter for screening for malignant neoplasm of respiratory organs: Secondary | ICD-10-CM

## 2023-05-09 DIAGNOSIS — Z87891 Personal history of nicotine dependence: Secondary | ICD-10-CM

## 2023-05-09 DIAGNOSIS — F17210 Nicotine dependence, cigarettes, uncomplicated: Secondary | ICD-10-CM

## 2023-05-09 NOTE — Telephone Encounter (Signed)
Pharmacy Patient Advocate Encounter   Received notification from Fax that prior authorization for Ozempic is required/requested.   Insurance verification completed.   The patient is insured through [employer] .   Per test claim:  Jardiance tablets is preferred by the insurance.  If suggested medication is appropriate, Please send in a new RX and discontinue this one. If not, please advise as to why it's not appropriate so that we may request a Prior Authorization. Please note, some preferred medications may still require a PA.  If the suggested medications have not been trialed and there are no contraindications to their use, the PA will not be submitted, as it will not be approved.   Full form received from plan will be attached in patients media tab.

## 2023-05-09 NOTE — Telephone Encounter (Signed)
PA request has been Started. New Encounter created for follow up. For additional info see Pharmacy Prior Auth telephone encounter from 05/09/23.

## 2023-05-09 NOTE — Telephone Encounter (Signed)
Pharmacy Patient Advocate Encounter   Received notification from Pt Calls Messages that prior authorization for Ozempic (0.25 or 0.5 MG/DOSE) 2MG /3ML pen-injectors is required/requested.   Insurance verification completed.   The patient is insured through [employer] .   Per test claim: PA required and submitted KEY/EOC/Request #: [id number] CANCELLED due to:Authorization already on file for this request. Authorization starting on 04/17/2023 and ending on 04/15/2024.

## 2023-05-09 NOTE — Telephone Encounter (Signed)
Please run prior authorization on Ozempic, [place] Pepper is not having in the same class as Ozempic.

## 2023-05-10 ENCOUNTER — Other Ambulatory Visit: Payer: Self-pay | Admitting: Acute Care

## 2023-05-10 ENCOUNTER — Telehealth: Payer: Self-pay

## 2023-05-10 DIAGNOSIS — Z122 Encounter for screening for malignant neoplasm of respiratory organs: Secondary | ICD-10-CM

## 2023-05-10 DIAGNOSIS — Z87891 Personal history of nicotine dependence: Secondary | ICD-10-CM

## 2023-05-10 DIAGNOSIS — F17210 Nicotine dependence, cigarettes, uncomplicated: Secondary | ICD-10-CM

## 2023-05-10 NOTE — Telephone Encounter (Signed)
Pt made aware and understood. Aware that results will be discussed further at appt on 1/29

## 2023-05-10 NOTE — Telephone Encounter (Signed)
-----   Message from [name] [name] sent at 05/10/2023 11:55 AM EST ----- Lung cancer screen shows that he has COPD changes in the lungs.  No sign of cancer.  It does show that he has some coronary atherosclerosis or plaques in the arteries of the heart, we will discuss this further at his next visit ----- Message ----- From: Sheran Luz, RN Sent: 05/10/2023  11:46 AM EST To: [name] [name], MD

## 2023-05-15 ENCOUNTER — Ambulatory Visit (INDEPENDENT_AMBULATORY_CARE_PROVIDER_SITE_OTHER): Payer: Medicare PPO | Admitting: Family Medicine

## 2023-05-15 ENCOUNTER — Encounter: Payer: Self-pay | Admitting: Family Medicine

## 2023-05-15 VITALS — BP 135/84 | HR 89 | Ht 73.0 in | Wt 226.0 lb

## 2023-05-15 DIAGNOSIS — E119 Type 2 diabetes mellitus without complications: Secondary | ICD-10-CM

## 2023-05-15 DIAGNOSIS — Z7985 Long-term (current) use of injectable non-insulin antidiabetic drugs: Secondary | ICD-10-CM | POA: Diagnosis not present

## 2023-05-15 DIAGNOSIS — E785 Hyperlipidemia, unspecified: Secondary | ICD-10-CM | POA: Diagnosis not present

## 2023-05-15 DIAGNOSIS — E7800 Pure hypercholesterolemia, unspecified: Secondary | ICD-10-CM | POA: Diagnosis not present

## 2023-05-15 DIAGNOSIS — E1169 Type 2 diabetes mellitus with other specified complication: Secondary | ICD-10-CM

## 2023-05-15 LAB — CBC WITH DIFFERENTIAL/PLATELET
Basophils Absolute: 0 10*3/uL (ref 0.0–0.2)
Basos: 1 %
EOS (ABSOLUTE): 0.2 10*3/uL (ref 0.0–0.4)
Eos: 2 %
Hematocrit: 47.7 % (ref 37.5–51.0)
Hemoglobin: 15.8 g/dL (ref 13.0–17.7)
Immature Grans (Abs): 0 10*3/uL (ref 0.0–0.1)
Immature Granulocytes: 0 %
Lymphocytes Absolute: 2.2 10*3/uL (ref 0.7–3.1)
Lymphs: 26 %
MCH: 29.4 pg (ref 26.6–33.0)
MCHC: 33.1 g/dL (ref 31.5–35.7)
MCV: 89 fL (ref 79–97)
Monocytes Absolute: 0.6 10*3/uL (ref 0.1–0.9)
Monocytes: 7 %
Neutrophils Absolute: 5.4 10*3/uL (ref 1.4–7.0)
Neutrophils: 64 %
Platelets: 141 10*3/uL — ABNORMAL LOW (ref 150–450)
RBC: 5.38 x10E6/uL (ref 4.14–5.80)
RDW: 13.1 % (ref 11.6–15.4)
WBC: 8.4 10*3/uL (ref 3.4–10.8)

## 2023-05-15 LAB — LIPID PANEL
Chol/HDL Ratio: 4.1 {ratio} (ref 0.0–5.0)
Cholesterol, Total: 177 mg/dL (ref 100–199)
HDL: 43 mg/dL (ref >39)
LDL Chol Calc (NIH): 107 mg/dL — ABNORMAL HIGH (ref 0–99)
Triglycerides: 155 mg/dL — ABNORMAL HIGH (ref 0–149)
VLDL Cholesterol Cal: 27 mg/dL (ref 5–40)

## 2023-05-15 LAB — CMP14+EGFR
ALT: 13 [IU]/L (ref 0–44)
AST: 13 [IU]/L (ref 0–40)
Albumin: 4.4 g/dL (ref 3.9–4.9)
Alkaline Phosphatase: 67 [IU]/L (ref 44–121)
BUN/Creatinine Ratio: 25 — ABNORMAL HIGH (ref 10–24)
BUN: 28 mg/dL — ABNORMAL HIGH (ref 8–27)
Bilirubin Total: 0.4 mg/dL (ref 0.0–1.2)
CO2: 17 mmol/L — ABNORMAL LOW (ref 20–29)
Calcium: 9.6 mg/dL (ref 8.6–10.2)
Chloride: 106 mmol/L (ref 96–106)
Creatinine, Ser: 1.12 mg/dL (ref 0.76–1.27)
Globulin, Total: 3.1 g/dL (ref 1.5–4.5)
Glucose: 104 mg/dL — ABNORMAL HIGH (ref 70–99)
Potassium: 4.5 mmol/L (ref 3.5–5.2)
Sodium: 139 mmol/L (ref 134–144)
Total Protein: 7.5 g/dL (ref 6.0–8.5)
eGFR: 72 mL/min/{1.73_m2} (ref >59)

## 2023-05-15 LAB — BAYER DCA HB A1C WAIVED: HB A1C (BAYER DCA - WAIVED): 5.7 % — ABNORMAL HIGH (ref 4.8–5.6)

## 2023-05-15 NOTE — Progress Notes (Signed)
BP 135/84   Pulse 89   Ht 6\' 1"  (1.854 m)   Wt 226 lb (102.5 kg)   SpO2 96%   BMI 29.82 kg/m    Subjective:   [name], male    DOB: 07/10/1955, 68 y.o.   MRN: [id number]  HPI: Eric Key is a 68 y.o. male presenting on 05/15/2023 for Medical Management of Chronic Issues, Diabetes, and Hyperlipidemia   HPI Type 2 diabetes mellitus Patient comes in today for recheck of his diabetes. Patient has been currently taking Ozempic, did not tolerate metformin. Patient is not currently on an ACE inhibitor/ARB. Patient has seen an ophthalmologist this year. Patient denies any new issues with their feet. The symptom started onset as an adult hyperlipidemia ARE RELATED TO DM    Hyperlipidemia Patient is coming in for recheck of his hyperlipidemia. The patient is currently taking Crestor. They deny any issues with myalgias or history of liver damage from it. They deny any focal numbness or weakness or chest pain.   Relevant past medical, surgical, family and social history reviewed and updated as indicated. Interim medical history since our last visit reviewed. Allergies and medications reviewed and updated.  Review of Systems  Constitutional:  Negative for chills and fever.  Eyes:  Negative for visual disturbance.  Respiratory:  Negative for shortness of breath and wheezing.   Cardiovascular:  Negative for chest pain and leg swelling.  Musculoskeletal:  Negative for back pain and gait problem.  Skin:  Negative for rash.  Neurological:  Negative for dizziness, weakness and light-headedness.  All other systems reviewed and are negative.   Eric HPI unless specifically indicated above   Allergies as of 05/15/2023       Reactions   Penicillins Hives        Medication List        Accurate as of May 15, 2023  8:45 AM. If you have any questions, ask your nurse or doctor.          STOP taking these medications    buPROPion 150 MG 12 hr  tablet Commonly known as: Zyban Stopped by: [name] Raheem Kolbe   metFORMIN 500 MG tablet Commonly known as: GLUCOPHAGE Stopped by: [name] Obert Espindola       TAKE these medications    FreeStyle Libre 3 Plus Sensor Misc 1 sensor change every 15 days.   ketorolac 0.5 % ophthalmic solution Commonly known as: ACULAR Place 1 drop into the left eye 4 (four) times daily.   Ozempic (0.25 or 0.5 MG/DOSE) 2 MG/3ML Sopn Generic drug: Semaglutide(0.25 or 0.5MG /DOS) INJECT 0.25MG  SUBCUTANEOUSLY ONCE A WEEK FOR 28 DAYS THEN INJECT 0.5MG  ONCE A WEEK   prednisoLONE acetate 1 % ophthalmic suspension Commonly known as: PRED FORTE Place 1 drop into the left eye 4 (four) times daily.   rosuvastatin 5 MG tablet Commonly known as: CRESTOR Take 1 tablet (5 mg total) by mouth daily.         Objective:   BP 135/84   Pulse 89   Ht 6\' 1"  (1.854 m)   Wt 226 lb (102.5 kg)   SpO2 96%   BMI 29.82 kg/m   Wt Readings from Last 3 Encounters:  05/15/23 226 lb (102.5 kg)  03/28/23 235 lb 9.6 oz (106.9 kg)  01/30/23 245 lb (111.1 kg)    Physical Exam Vitals and nursing note reviewed.  Constitutional:      General: He is not in acute distress.  Appearance: He is well-developed. He is not diaphoretic.  Eyes:     General: No scleral icterus.    Conjunctiva/sclera: Conjunctivae normal.  Neck:     Thyroid: No thyromegaly.  Cardiovascular:     Rate and Rhythm: Normal rate and regular rhythm.     Heart sounds: Normal heart sounds. No murmur heard. Pulmonary:     Effort: Pulmonary effort is normal. No respiratory distress.     Breath sounds: Normal breath sounds. No wheezing or rhonchi.  Musculoskeletal:        General: No swelling. Normal range of motion.     Cervical back: Neck supple.  Lymphadenopathy:     Cervical: No cervical adenopathy.  Skin:    General: Skin is warm and dry.     Findings: No rash.  Neurological:     Mental Status: He is alert and oriented to person, place, and  time.     Coordination: Coordination normal.  Psychiatric:        Behavior: Behavior normal.     Results for orders placed or performed in visit on 05/15/23  HM DIABETES FOOT EXAM   Collection Time: 04/23/23 12:00 AM  Result Value Ref Range   HM Diabetic Foot Exam normal     Assessment & Plan:   Problem List Items Addressed This Visit       Endocrine   Hyperlipidemia associated with type 2 diabetes mellitus (HCC) - Primary   Relevant Orders   Microalbumin / creatinine urine ratio   Type 2 diabetes mellitus with other specified complication (HCC)    A1c was 5.7, making great changes with diet.  He stopped the metformin because of GI distress but doing fine on just the Ozempic, we will keep it going for a little bit longer but if he keeps his diet the way that he is I have a feeling that we will be backing off on the Ozempic in the future Follow up plan: Return in about 3 months (around 08/13/2023), or if symptoms worsen or fail to improve, for Diabetes recheck.  Counseling provided for all of the vaccine components Orders Placed This Encounter  Procedures   CBC with Differential/Platelet   CMP14+EGFR   Lipid panel   Bayer DCA Hb A1c Waived   Microalbumin / creatinine urine ratio   HM DIABETES FOOT EXAM    Arville Care, MD [facility] 05/15/2023, 8:45 AM

## 2023-05-16 LAB — MICROALBUMIN / CREATININE URINE RATIO
Creatinine, Urine: 189.8 mg/dL
Microalb/Creat Ratio: 16 mg/g{creat} (ref 0–29)
Microalbumin, Urine: 31.2 ug/mL

## 2023-05-21 ENCOUNTER — Encounter (INDEPENDENT_AMBULATORY_CARE_PROVIDER_SITE_OTHER): Payer: Self-pay | Admitting: Ophthalmology

## 2023-05-21 ENCOUNTER — Ambulatory Visit (INDEPENDENT_AMBULATORY_CARE_PROVIDER_SITE_OTHER): Payer: Medicare PPO | Admitting: Ophthalmology

## 2023-05-21 DIAGNOSIS — I10 Essential (primary) hypertension: Secondary | ICD-10-CM

## 2023-05-21 DIAGNOSIS — H25811 Combined forms of age-related cataract, right eye: Secondary | ICD-10-CM | POA: Diagnosis not present

## 2023-05-21 DIAGNOSIS — Z7984 Long term (current) use of oral hypoglycemic drugs: Secondary | ICD-10-CM

## 2023-05-21 DIAGNOSIS — H353221 Exudative age-related macular degeneration, left eye, with active choroidal neovascularization: Secondary | ICD-10-CM

## 2023-05-21 DIAGNOSIS — H353112 Nonexudative age-related macular degeneration, right eye, intermediate dry stage: Secondary | ICD-10-CM | POA: Diagnosis not present

## 2023-05-21 DIAGNOSIS — Z961 Presence of intraocular lens: Secondary | ICD-10-CM

## 2023-05-21 DIAGNOSIS — H35033 Hypertensive retinopathy, bilateral: Secondary | ICD-10-CM

## 2023-05-21 DIAGNOSIS — E119 Type 2 diabetes mellitus without complications: Secondary | ICD-10-CM | POA: Diagnosis not present

## 2023-05-21 DIAGNOSIS — H3552 Pigmentary retinal dystrophy: Secondary | ICD-10-CM

## 2023-05-21 DIAGNOSIS — Z7985 Long-term (current) use of injectable non-insulin antidiabetic drugs: Secondary | ICD-10-CM

## 2023-05-21 DIAGNOSIS — H04123 Dry eye syndrome of bilateral lacrimal glands: Secondary | ICD-10-CM

## 2023-05-21 MED ORDER — FARICIMAB-SVOA 6 MG/0.05ML IZ SOLN
6.0000 mg | INTRAVITREAL | Status: AC | PRN
  Administered 2023-05-21: 6 mg via INTRAVITREAL

## 2023-05-24 ENCOUNTER — Encounter: Payer: Self-pay | Admitting: Family Medicine

## 2023-05-24 ENCOUNTER — Other Ambulatory Visit: Payer: Self-pay | Admitting: Family Medicine

## 2023-05-24 DIAGNOSIS — E119 Type 2 diabetes mellitus without complications: Secondary | ICD-10-CM

## 2023-05-24 MED ORDER — OZEMPIC (0.25 OR 0.5 MG/DOSE) 2 MG/3ML ~~LOC~~ SOPN: 0.5000 mg | PEN_INJECTOR | SUBCUTANEOUS | 0 refills | Status: DC

## 2023-06-11 NOTE — Progress Notes (Signed)
 [facility] - Clinic Note  06/25/2023    CHIEF COMPLAINT Patient presents for Retina Follow Up  HISTORY OF PRESENT ILLNESS: Eric Key is a 68 y.o. male who presents to the clinic today for:   HPI     Retina Follow Up   Patient presents with  Wet AMD.  In left eye.  Severity is moderate.  Duration of 4 weeks.  Since onset it is stable.  I, the attending physician,  performed the HPI with the patient and updated documentation appropriately.        Comments   Patient feels the vision in the left eye is getting worse. The vision is blurry and he can see a black hair floating around. He is using AT's. His blood sugar is 105.      Last edited by Rennis Chris, MD on 06/25/2023  8:18 AM.    Pt states on certain days, he notices a black hair in his vision, he feels like his left eye vision is getting worse overall   Referring physician: [name], [name], MD [address] [place],  [place] [address]  HISTORICAL INFORMATION:   Selected notes from the MEDICAL RECORD NUMBER Referred by Dr. Ginette Otto for concern of mac heme OS.   CURRENT MEDICATIONS: Current Outpatient Medications (Ophthalmic Drugs)  Medication Sig   ketorolac (ACULAR) 0.5 % ophthalmic solution Place 1 drop into the left eye 4 (four) times daily.   prednisoLONE acetate (PRED FORTE) 1 % ophthalmic suspension Place 1 drop into the left eye 4 (four) times daily.   No current facility-administered medications for this visit. (Ophthalmic Drugs)   Current Outpatient Medications (Other)  Medication Sig   Continuous Glucose Sensor (FREESTYLE LIBRE 3 PLUS SENSOR) MISC 1 sensor change every 15 days.   rosuvastatin (CRESTOR) 5 MG tablet Take 1 tablet (5 mg total) by mouth daily.   Semaglutide,0.25 or 0.5MG /DOS, (OZEMPIC, 0.25 OR 0.5 MG/DOSE,) 2 MG/3ML SOPN Inject 0.5 mg into the skin once a week.   No current facility-administered medications for this visit. (Other)   REVIEW OF SYSTEMS: ROS    Positive for: Endocrine, Eyes Negative for: Constitutional, Gastrointestinal, Neurological, Skin, Genitourinary, Musculoskeletal, HENT, Cardiovascular, Respiratory, Psychiatric, Allergic/Imm, Heme/Lymph Last edited by Charlette Caffey, COT on 06/25/2023  7:46 AM.      ALLERGIES Allergies  Allergen Reactions   Penicillins Hives   PAST MEDICAL HISTORY Past Medical History:  Diagnosis Date   Allergy    Penicillin   Cataract    Mixed form OU   History of adenomatous polyps of colon 03/16/2020   Hypertension    past hx    Hypertensive retinopathy    OU   Macular degeneration    Dry OD; Wet OS   Past Surgical History:  Procedure Laterality Date   ACHILLES TENDON REPAIR  2010   CHOLECYSTECTOMY     COLONOSCOPY     EYE SURGERY     GALLBLADDER SURGERY     FAMILY HISTORY Family History  Problem Relation Age of Onset   Diabetes Brother    Heart disease Brother    Diabetes Maternal Grandfather    Colon polyps Neg Hx    Colon cancer Neg Hx    Esophageal cancer Neg Hx    Rectal cancer Neg Hx    Stomach cancer Neg Hx    SOCIAL HISTORY Social History   Tobacco Use   Smoking status: Some Days    Current packs/day: 0.75  Average packs/day: 0.8 packs/day for 41.0 years (30.8 ttl pk-yrs)    Types: Cigars, Cigarettes   Smokeless tobacco: Never  Vaping Use   Vaping status: Never Used  Substance Use Topics   Alcohol use: Not Currently    Comment: occ   Drug use: Not Currently       OPHTHALMIC EXAM: Base Eye Exam     Visual Acuity (Snellen - Linear)       Right Left   Dist cc 20/20 20/40   Dist ph cc  NI    Correction: Glasses         Tonometry (Tonopen, 7:49 AM)       Right Left   Pressure 18 13         Pupils       Dark Light Shape React APD   Right 3 2 Round Brisk None   Left 3 2 Round Brisk None         Visual Fields       Left Right    Full Full         Extraocular Movement       Right Left    Full, Ortho Full, Ortho          Neuro/Psych     Oriented x3: Yes   Mood/Affect: Normal         Dilation     Both eyes: 1.0% Mydriacyl @ 7:47 AM           Slit Lamp and Fundus Exam     Slit Lamp Exam       Right Left   Lids/Lashes Dermatochalasis - upper lid Dermatochalasis - upper lid   Conjunctiva/Sclera White and quiet White and quiet   Cornea trace PEE, mild Arcus, tear film debris mild Arcus, 2+ fine punctate epithelial erosions inferiorly, tear film debris -- improved, well healed cataract wound   Anterior Chamber deep, clear, narrow angles deep, 0.5+cell/pigment   Iris Round and dilated, mild anterior bowing Round and moderately dilated, mild anterior bowing   Lens 2-3+ Nuclear sclerosis, 2-3+ Cortical cataract PC IOL in good position   Anterior Vitreous Vitreous syneresis Vitreous syneresis, fine cell/pigment         Fundus Exam       Right Left   Disc Pink and Sharp, focal PPP ST, Compact Pink and Sharp, Compact   C/D Ratio 0.2 0.4   Macula Flat, Drusen, RPE mottling and clumping, early Atrophy, punctate IRH -- fading abnormal foveal reflex, +CNV with heme stably improved, +central edema / cystic changes -- slightly improved, Drusen, RPE mottling and clumping, early subretinal fibrosis, punctate IRH - stably improved, no heme   Vessels attenuated, tortuous, perivascular pigment clumping peripherally, mild AV crossing changes attenuated, mild tortuousity, perivascular pigment clumping peripherally   Periphery Attached, peripheral pigmented CR scarring / atrophy and perivascular bone spicules 360, No heme Attached, peripheral pigmented CR scarring / atrophy and perivascular bone spicules 360, No heme           Refraction     Wearing Rx       Sphere Cylinder Axis Add   Right +0.25 +1.50 003 +2.00   Left +0.25 +0.75 169 +2.00           IMAGING AND PROCEDURES  Imaging and Procedures for @TODAY @  OCT, Retina - OU - Both Eyes       Right Eye Quality was good. Central Foveal  Thickness: 302. Progression has been stable. Findings include normal  foveal contour, no IRF, no SRF, retinal drusen , pigment epithelial detachment, outer retinal atrophy (Persistent vitreous opacities; stable low lying PEDs and ORA, partial PVD).   Left Eye Quality was good. Central Foveal Thickness: 447. Progression has improved. Findings include abnormal foveal contour, retinal drusen , subretinal hyper-reflective material, intraretinal fluid, pigment epithelial detachment, outer retinal atrophy (Interval improvement in IRF overlying stable low-lying PED / [facility] temporal fovea and macula).   Notes *Images captured and stored on drive  Diagnosis / Impression:  OD: non-exu ARMD w/ stable low lying PEDs and ORA OS: exu ARMD -- interval improvement in IRF overlying stable low-lying PED / [facility] temporal fovea and macula  Clinical management:  See below  Abbreviations: NFP - Normal foveal profile. CME - cystoid macular edema. PED - pigment epithelial detachment. IRF - intraretinal fluid. SRF - subretinal fluid. EZ - ellipsoid zone. ERM - epiretinal membrane. ORA - outer retinal atrophy. ORT - outer retinal tubulation. SRHM - subretinal hyper-reflective material      Intravitreal Injection, Pharmacologic Agent - OS - Left Eye       Time Out 06/25/2023. 8:22 AM. Confirmed correct patient, procedure, site, and patient consented.   Anesthesia Topical anesthesia was used. Anesthetic medications included Lidocaine 2%, Proparacaine 0.5%.   Procedure Preparation included 5% betadine to ocular surface, eyelid speculum. A (32g) needle was used.   Injection: 6 mg faricimab-svoa 6 MG/0.05ML (Patient supplied)   Route: Intravitreal, Site: Left Eye   NDC: 82956-213-08, Lot: [id number], Expiration date: 12/14/2024, Waste: 0 mL   Post-op Post injection exam found visual acuity of at least counting fingers. The patient tolerated the procedure well. There were no complications. The patient received  written and verbal post procedure care education. Post injection medications were not given.   Notes **SAMPLE MEDICATION ADMINISTERED**           ASSESSMENT/PLAN:   ICD-10-CM   1. Exudative age-related macular degeneration of left eye with active choroidal neovascularization (HCC)  H35.3221 OCT, Retina - OU - Both Eyes    Intravitreal Injection, Pharmacologic Agent - OS - Left Eye    faricimab-svoa (VABYSMO) 6mg /0.40mL intravitreal injection    2. 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 Diabetes mellitus type 2 without retinopathy (HCC)  E11.9     5. Long term (current) use of oral hypoglycemic drugs  Z79.84     6. Long-term (current) use of injectable non-insulin antidiabetic drugs  Z79.85     7. Essential hypertension  I10     8. Hypertensive retinopathy of both eyes  H35.033     9. Combined forms of age-related cataract of right eye  H25.811     10. Pseudophakia  Z96.1     11. Dry eyes  H04.123      1. 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, OS  - s/p IVA OS #1 (03.04.21), #2 (03.04.21), #3 (04.30.21), #4 (01.07.25)  - s/p IVE OS #1 (06.04.21), #2 (07.08.21), #3 (08.09.21), #4 (09.13.21), #5 (10.18.21), #6 (11.24.21), #7 (12.31.21), #8 (02.11.22), #9 (03.25.22), #10 (05.02.22), #11 (06.06.22), #12 (07.13.22), #13 (08.17.22), #14 (09.21.22), #15 (10.26.22), #16 (11.29.22), #17 (01.03.23), #18 (02.07.23), #19 (03.16.23), #20 (04.20.23), #21 (05.19.23), #22 (06.20.23) -- IVE resistance  - s/p IVV OS #1 (07.18.23), #2 (08.15.23), #3 (09.12.23), #4 (10.10.23), #5 (11.14.23), #6 (12.21.23), #7 (01.25.24), #8 (02.29.24), #9 (04.11.24), #10 (05.20.24), #11 (07.01.24), #12 (08.13.24), #13 (10.01.24), #14 (11.26.24), 15 (sample  02.04.25)  - exam shows central CNV with +subretinal heme -- improved  - BCVA OS 20/40 - stable  - OCT shows interval improvement in IRF overlying stable  low-lying PED / [facility] temporal fovea and macula at 5 weeks   - recommend IVV OS #16 (sample) today 03.11.25 with follow up in 4 weeks -- [employer] funding unavailable  - pt in agreement and wishes to proceed with injection  - RBA of procedure discussed, questions answered - see procedure note  - IVV informed consent obtained and signed, 10.01.24  - cont PF and Prolensa QID OS for possible CME/post-op inflammatory component   - f/u 4 weeks -- DFE/OCT, possible injection  2. Age related macular degeneration, non-exudative, OD  - The incidence, anatomy, and pathology of dry AMD, risk of progression, and the AREDS and AREDS 2 study including smoking risks discussed with patient.   - recommend Amsler grid monitoring  3. Pigmentary retinopathy OU  - peripheral pigmented CR scarring and perivascular pigment clumping / bone spicules OU  - VA remains fairly good (20/20 OD, 20/40 OS with exudative ARMD OS)  - denies night blindness  - denies family history of vision problems  - discussed findings  - broad differential for pigmentary retinopathy:  Congenital (RP), infectious (TB, lyme dx, syphilis, toxo, bartonella), autoimmune and toxic etiologies  - labs checked (4.2.21):  All essential WNL   Rheumatoid factor   CBC   Comprehensive metabolic panel     RPR   ANA   ESR, CRP   ACE   ANCA   Toxoplasma antibodies IgG / IgM   Quantiferon - TB Gold Plus   HLA, A, B, C (IR)   Fluorescent treponemal ab (fla) - IgG - bld   VDRL Serum   Bartonella antibody, 06.04.21  - [facility] -- drawn on 6.4.21                         Results Positive for AGBL5, ALMS1, OPA1 -- unknown significance              - monitor  4-6. Diabetes mellitus, type 2 without retinopathy  - dx in October 2024 -- A1c 11.9 on 10.16.24  - on Ozempic and metformin - The incidence, risk factors for progression, natural history and treatment options for diabetic retinopathy  were discussed with patient.   - The need  for close monitoring of blood glucose, blood pressure, and serum lipids, avoiding cigarette or any type of tobacco, and the need for long term follow up was also discussed with patient. - f/u in 1 year, sooner prn  7,8. Hypertensive retinopathy OU  - discussed importance of tight BP control  - continue to monitor  9. Mixed form age related cataracts OD  - The symptoms of cataract, surgical options, and treatments and risks were discussed with patient.  - discussed diagnosis and progression  - under the expert management of Dr. Cathey Endow   10. Pseudophakia OS  - s/p CE/IOL OS (Dr. Cathey Endow, 10.23.24)  - IOL in good position  - OCT shows mild cystic changes - ?CME / post-op inflammatory component  - cont PF and Prolensa QID OS as above  - monitor  11. Dry eyes OU - recommend artificial tears and lubricating ointment as needed   Ophthalmic Meds Ordered this visit:  Meds ordered this encounter  Medications   faricimab-svoa (VABYSMO) 6mg /0.22mL intravitreal injection     Return in about 4 weeks (  around 07/23/2023) for f/u exu ARMD OS, DFE, OCT, Possible Injxn.  There are no Patient Instructions on file for this visit.  This document serves as a record of services personally performed by Karie Chimera, MD, PhD. It was created on their behalf by Charlette Caffey, COT an ophthalmic technician. The creation of this record is the provider's dictation and/or activities during the visit.    Electronically signed by:  Charlette Caffey, COT  06/25/23 9:14 PM  This document serves as a record of services personally performed by Karie Chimera, MD, PhD. It was created on their behalf by Glee Arvin. Manson Passey, OA an ophthalmic technician. The creation of this record is the provider's dictation and/or activities during the visit.    Electronically signed by: Glee Arvin. Manson Passey, OA 06/25/23 9:14 PM  Karie Chimera, M.D., Ph.D. Diseases & Surgery of the Retina and Vitreous [facility]  I have reviewed the above documentation for accuracy and completeness, and I agree with the above. Karie Chimera, M.D., Ph.D. 06/25/23 9:16 PM   Abbreviations: M myopia (nearsighted); A astigmatism; H hyperopia (farsighted); P presbyopia; Mrx spectacle prescription;  CTL contact lenses; OD right eye; OS left eye; OU both eyes  XT exotropia; ET esotropia; PEK punctate epithelial keratitis; PEE punctate epithelial erosions; DES dry eye syndrome; MGD meibomian gland dysfunction; ATs artificial tears; PFAT's preservative free artificial tears; NSC nuclear sclerotic cataract; PSC posterior subcapsular cataract; ERM epi-retinal membrane; PVD posterior vitreous detachment; RD retinal detachment; DM diabetes mellitus; DR diabetic retinopathy; NPDR non-proliferative diabetic retinopathy; PDR proliferative diabetic retinopathy; CSME clinically significant macular edema; DME diabetic macular edema; dbh dot blot hemorrhages; CWS cotton wool spot; POAG primary open angle glaucoma; C/D cup-to-disc ratio; HVF humphrey visual field; GVF goldmann visual field; OCT optical coherence tomography; IOP intraocular pressure; BRVO Branch retinal vein occlusion; CRVO central retinal vein occlusion; CRAO central retinal artery occlusion; BRAO branch retinal artery occlusion; RT retinal tear; SB scleral buckle; PPV pars plana vitrectomy; VH Vitreous hemorrhage; PRP panretinal laser photocoagulation; IVK intravitreal kenalog; VMT vitreomacular traction; MH Macular hole;  NVD neovascularization of the disc; NVE neovascularization elsewhere; AREDS age related eye disease study; ARMD age related macular degeneration; POAG primary open angle glaucoma; EBMD epithelial/anterior basement membrane dystrophy; ACIOL anterior chamber intraocular lens; IOL intraocular lens; PCIOL posterior chamber intraocular lens; Phaco/IOL phacoemulsification with intraocular lens placement; PRK photorefractive keratectomy; LASIK laser assisted in situ  keratomileusis; HTN hypertension; DM diabetes mellitus; COPD chronic obstructive pulmonary disease

## 2023-06-25 ENCOUNTER — Encounter (INDEPENDENT_AMBULATORY_CARE_PROVIDER_SITE_OTHER): Payer: Self-pay | Admitting: Ophthalmology

## 2023-06-25 ENCOUNTER — Ambulatory Visit (INDEPENDENT_AMBULATORY_CARE_PROVIDER_SITE_OTHER): Payer: Medicare PPO | Admitting: Ophthalmology

## 2023-06-25 DIAGNOSIS — H04123 Dry eye syndrome of bilateral lacrimal glands: Secondary | ICD-10-CM

## 2023-06-25 DIAGNOSIS — Z7985 Long-term (current) use of injectable non-insulin antidiabetic drugs: Secondary | ICD-10-CM | POA: Diagnosis not present

## 2023-06-25 DIAGNOSIS — Z7984 Long term (current) use of oral hypoglycemic drugs: Secondary | ICD-10-CM

## 2023-06-25 DIAGNOSIS — E119 Type 2 diabetes mellitus without complications: Secondary | ICD-10-CM | POA: Diagnosis not present

## 2023-06-25 DIAGNOSIS — H35033 Hypertensive retinopathy, bilateral: Secondary | ICD-10-CM | POA: Diagnosis not present

## 2023-06-25 DIAGNOSIS — H25811 Combined forms of age-related cataract, right eye: Secondary | ICD-10-CM

## 2023-06-25 DIAGNOSIS — H3552 Pigmentary retinal dystrophy: Secondary | ICD-10-CM | POA: Diagnosis not present

## 2023-06-25 DIAGNOSIS — Z961 Presence of intraocular lens: Secondary | ICD-10-CM

## 2023-06-25 DIAGNOSIS — I10 Essential (primary) hypertension: Secondary | ICD-10-CM

## 2023-06-25 DIAGNOSIS — H353112 Nonexudative age-related macular degeneration, right eye, intermediate dry stage: Secondary | ICD-10-CM

## 2023-06-25 DIAGNOSIS — H353221 Exudative age-related macular degeneration, left eye, with active choroidal neovascularization: Secondary | ICD-10-CM

## 2023-06-25 MED ORDER — FARICIMAB-SVOA 6 MG/0.05ML IZ SOLN
6.0000 mg | INTRAVITREAL | Status: AC | PRN
  Administered 2023-06-25: 6 mg via INTRAVITREAL

## 2023-06-27 ENCOUNTER — Other Ambulatory Visit: Payer: Self-pay | Admitting: Family Medicine

## 2023-06-27 DIAGNOSIS — E119 Type 2 diabetes mellitus without complications: Secondary | ICD-10-CM

## 2023-06-27 NOTE — Telephone Encounter (Signed)
 Pt scheduled 08/16/23

## 2023-06-27 NOTE — Telephone Encounter (Signed)
 [name] NTBS end of April for 3 mos FU RF sent to pharmacy

## 2023-07-17 NOTE — Progress Notes (Signed)
 [facility] - Clinic Note  07/23/2023    CHIEF COMPLAINT Patient presents for Retina Follow Up  HISTORY OF PRESENT ILLNESS: Eric Key is a 68 y.o. male who presents to the clinic today for:   HPI     Retina Follow Up   Patient presents with  Wet AMD.  In left eye.  This started 4 weeks ago.  Severity is moderate.  Duration of 4 weeks.  Since onset it is stable.  I, the attending physician,  performed the HPI with the patient and updated documentation appropriately.        Comments   Patient feels the vision is the same. He is not using eye drops at this time. His blood sugar was 110.      Last edited by Ronelle Coffee, MD on 07/27/2023 11:27 PM.     Pt states his floaters are better   Referring physician: [name], Lucio Sabin, MD [address] [place],  [place] [address]  HISTORICAL INFORMATION:   Selected notes from the MEDICAL RECORD NUMBER Referred by Dr. Derril Flint for concern of mac heme OS.   CURRENT MEDICATIONS: Current Outpatient Medications (Ophthalmic Drugs)  Medication Sig   ketorolac (ACULAR) 0.5 % ophthalmic solution Place 1 drop into the left eye 4 (four) times daily. (Patient not taking: Reported on 07/23/2023)   prednisoLONE acetate (PRED FORTE) 1 % ophthalmic suspension Place 1 drop into the left eye 4 (four) times daily. (Patient not taking: Reported on 07/23/2023)   No current facility-administered medications for this visit. (Ophthalmic Drugs)   Current Outpatient Medications (Other)  Medication Sig   Continuous Glucose Sensor (FREESTYLE LIBRE 3 PLUS SENSOR) MISC 1 sensor change every 15 days.   rosuvastatin (CRESTOR) 5 MG tablet Take 1 tablet (5 mg total) by mouth daily.   Semaglutide,0.25 or 0.5MG /DOS, (OZEMPIC, 0.25 OR 0.5 MG/DOSE,) 2 MG/3ML SOPN INJECT 0.5 MG SUBCUTANEOUSLY ONCE A WEEK   No current facility-administered medications for this visit. (Other)   REVIEW OF SYSTEMS: ROS   Positive for: Endocrine, Eyes Negative  for: Constitutional, Gastrointestinal, Neurological, Skin, Genitourinary, Musculoskeletal, HENT, Cardiovascular, Respiratory, Psychiatric, Allergic/Imm, Heme/Lymph Last edited by Olene Berne, COT on 07/23/2023  7:38 AM.       ALLERGIES Allergies  Allergen Reactions   Penicillins Hives   PAST MEDICAL HISTORY Past Medical History:  Diagnosis Date   Allergy    Penicillin   Cataract    Mixed form OU   History of adenomatous polyps of colon 03/16/2020   Hypertension    past hx    Hypertensive retinopathy    OU   Macular degeneration    Dry OD; Wet OS   Past Surgical History:  Procedure Laterality Date   ACHILLES TENDON REPAIR  2010   CHOLECYSTECTOMY     COLONOSCOPY     EYE SURGERY     GALLBLADDER SURGERY     FAMILY HISTORY Family History  Problem Relation Age of Onset   Diabetes Brother    Heart disease Brother    Diabetes Maternal Grandfather    Colon polyps Neg Hx    Colon cancer Neg Hx    Esophageal cancer Neg Hx    Rectal cancer Neg Hx    Stomach cancer Neg Hx    SOCIAL HISTORY Social History   Tobacco Use   Smoking status: Some Days    Current packs/day: 0.75    Average packs/day: 0.8 packs/day for 41.0 years (30.8 ttl pk-yrs)  Types: Cigars, Cigarettes   Smokeless tobacco: Never  Vaping Use   Vaping status: Never Used  Substance Use Topics   Alcohol use: Not Currently    Comment: occ   Drug use: Not Currently       OPHTHALMIC EXAM: Base Eye Exam     Visual Acuity (Snellen - Linear)       Right Left   Dist cc 20/20 20/30   Dist ph cc  NI    Correction: Glasses         Tonometry (Tonopen, 7:42 AM)       Right Left   Pressure 18 16         Pupils       Dark Light Shape React APD   Right 3 2 Round Brisk None   Left 3 2 Round Brisk None         Visual Fields       Left Right    Full Full         Extraocular Movement       Right Left    Full, Ortho Full, Ortho         Neuro/Psych     Oriented x3: Yes    Mood/Affect: Normal         Dilation     Both eyes: 1.0% Mydriacyl, 2.5% Phenylephrine @ 7:39 AM           Slit Lamp and Fundus Exam     Slit Lamp Exam       Right Left   Lids/Lashes Dermatochalasis - upper lid Dermatochalasis - upper lid   Conjunctiva/Sclera White and quiet White and quiet   Cornea trace PEE, mild Arcus, tear film debris mild Arcus, 2+ fine punctate epithelial erosions inferiorly, tear film debris -- improved, well healed cataract wound   Anterior Chamber deep, clear, narrow angles deep, 0.5+cell/pigment   Iris Round and dilated, mild anterior bowing Round and moderately dilated, mild anterior bowing   Lens 2-3+ Nuclear sclerosis, 2-3+ Cortical cataract PC IOL in good position   Anterior Vitreous Vitreous syneresis Vitreous syneresis, fine cell/pigment         Fundus Exam       Right Left   Disc Pink and Sharp, focal PPP ST, Compact Pink and Sharp, Compact   C/D Ratio 0.2 0.4   Macula Flat, Drusen, RPE mottling and clumping, early Atrophy, punctate IRH -- almost resolved abnormal foveal reflex, +CNV with heme stably improved, +central edema / cystic changes -- slightly improved, Drusen, RPE mottling and clumping, early subretinal fibrosis, punctate IRH - stably improved, no heme   Vessels attenuated, tortuous, perivascular pigment clumping peripherally, mild AV crossing changes attenuated, mild tortuousity, perivascular pigment clumping peripherally   Periphery Attached, peripheral pigmented CR scarring / atrophy and perivascular bone spicules 360, No heme Attached, peripheral pigmented CR scarring / atrophy and perivascular bone spicules 360, No heme           Refraction     Wearing Rx       Sphere Cylinder Axis Add   Right +0.25 +1.50 003 +2.00   Left +0.25 +0.75 169 +2.00           IMAGING AND PROCEDURES  Imaging and Procedures for @TODAY @  OCT, Retina - OU - Both Eyes       Right Eye Quality was good. Central Foveal Thickness:  305. Progression has been stable. Findings include normal foveal contour, no IRF, no SRF, retinal drusen , pigment  epithelial detachment, outer retinal atrophy (Persistent vitreous opacities; stable low lying PEDs and ORA, partial PVD).   Left Eye Quality was good. Central Foveal Thickness: 410. Progression has improved. Findings include abnormal foveal contour, retinal drusen , subretinal hyper-reflective material, intraretinal fluid, pigment epithelial detachment, outer retinal atrophy (Interval improvement in IRF overlying stable low-lying PED / [facility] temporal fovea and macula).   Notes *Images captured and stored on drive  Diagnosis / Impression:  OD: non-exu ARMD w/ stable low lying PEDs and ORA OS: exu ARMD -- interval improvement in IRF overlying stable low-lying PED / [facility] temporal fovea and macula  Clinical management:  See below  Abbreviations: NFP - Normal foveal profile. CME - cystoid macular edema. PED - pigment epithelial detachment. IRF - intraretinal fluid. SRF - subretinal fluid. EZ - ellipsoid zone. ERM - epiretinal membrane. ORA - outer retinal atrophy. ORT - outer retinal tubulation. SRHM - subretinal hyper-reflective material      Intravitreal Injection, Pharmacologic Agent - OS - Left Eye       Time Out 07/23/2023. 8:27 AM. Confirmed correct patient, procedure, site, and patient consented.   Anesthesia Topical anesthesia was used. Anesthetic medications included Lidocaine 2%, Proparacaine 0.5%.   Procedure Preparation included 5% betadine to ocular surface, eyelid speculum. A (32g) needle was used.   Injection: 6 mg faricimab-svoa 6 MG/0.05ML   Route: Intravitreal, Site: Left Eye   NDC: 25366-440-34, Lot: [id number], Expiration date: 08/13/2024, Waste: 0 mL   Post-op Post injection exam found visual acuity of at least counting fingers. The patient tolerated the procedure well. There were no complications. The patient received written and verbal post procedure  care education. Post injection medications were not given.            ASSESSMENT/PLAN:   ICD-10-CM   1. Exudative age-related macular degeneration of left eye with active choroidal neovascularization (HCC)  H35.3221 OCT, Retina - OU - Both Eyes    Intravitreal Injection, Pharmacologic Agent - OS - Left Eye    faricimab-svoa (VABYSMO) 6mg /0.20mL intravitreal injection    2. 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 Diabetes mellitus type 2 without retinopathy (HCC)  E11.9     5. Long term (current) use of oral hypoglycemic drugs  Z79.84     6. Long-term (current) use of injectable non-insulin antidiabetic drugs  Z79.85     7. Essential hypertension  I10     8. Hypertensive retinopathy of both eyes  H35.033     9. Combined forms of age-related cataract of right eye  H25.811     10. Pseudophakia  Z96.1     11. Dry eyes  H04.123      1. 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, OS  - s/p IVA OS #1 (03.04.21), #2 (03.04.21), #3 (04.30.21), #4 (01.07.25)  - s/p IVE OS #1 (06.04.21), #2 (07.08.21), #3 (08.09.21), #4 (09.13.21), #5 (10.18.21), #6 (11.24.21), #7 (12.31.21), #8 (02.11.22), #9 (03.25.22), #10 (05.02.22), #11 (06.06.22), #12 (07.13.22), #13 (08.17.22), #14 (09.21.22), #15 (10.26.22), #16 (11.29.22), #17 (01.03.23), #18 (02.07.23), #19 (03.16.23), #20 (04.20.23), #21 (05.19.23), #22 (06.20.23) -- IVE resistance  - s/p IVV OS #1 (07.18.23), #2 (08.15.23), #3 (09.12.23), #4 (10.10.23), #5 (11.14.23), #6 (12.21.23), #7 (01.25.24), #8 (02.29.24), #9 (04.11.24), #10 (05.20.24), #11 (07.01.24), #12 (08.13.24), #13 (10.01.24), #14 (11.26.24), 15 (sample 02.04.25), #16 (sample 03.11.25)  - exam shows central CNV with +subretinal heme -- improved  -  BCVA OS 20/30 from 20/40  - OCT shows interval improvement in IRF overlying stable low-lying PED / [facility] temporal fovea and macula at 4 weeks   **discussed decreased efficacy / resistance to Vabysmo and potential benefit of switching from Vabysmo to [place] [facility]**  - recommend IVV OS #17 today 04.08.25 with follow up in 4 weeks  - pt in agreement and wishes to proceed with injection  - RBA of procedure discussed, questions answered - see procedure note  - IVV informed consent obtained and signed, 10.01.24  - cont PF and Prolensa QID OS for possible CME/post-op inflammatory component   - [employer] Medicare co-pay covering Vabysmo  - will check [facility] auth for next visit  - f/u 4 weeks -- DFE/OCT, possible injection  2. Age related macular degeneration, non-exudative, OD  - The incidence, anatomy, and pathology of dry AMD, risk of progression, and the AREDS and AREDS 2 study including smoking risks discussed with patient.   - recommend Amsler grid monitoring  3. Pigmentary retinopathy OU  - peripheral pigmented CR scarring and perivascular pigment clumping / bone spicules OU  - VA remains fairly good (20/20 OD, 20/40 OS with exudative ARMD OS)  - denies night blindness  - denies family history of vision problems  - discussed findings  - broad differential for pigmentary retinopathy:  Congenital (RP), infectious (TB, lyme dx, syphilis, toxo, bartonella), autoimmune and toxic etiologies  - labs checked (4.2.21):  All essential WNL   Rheumatoid factor   CBC   Comprehensive metabolic panel     RPR   ANA   ESR, CRP   ACE   ANCA   Toxoplasma antibodies IgG / IgM   Quantiferon - TB Gold Plus   HLA, A, B, C (IR)   Fluorescent treponemal ab (fla) - IgG - bld   VDRL Serum   Bartonella antibody, 06.04.21  - [facility] -- drawn on 6.4.21                         Results Positive for AGBL5, ALMS1, OPA1 -- unknown significance              - monitor  4-6. Diabetes mellitus, type 2 without retinopathy  - dx in October 2024 -- A1c 11.9 on 10.16.24  - on Ozempic and metformin - The incidence, risk factors for progression,  natural history and treatment options for diabetic retinopathy  were discussed with patient.   - The need for close monitoring of blood glucose, blood pressure, and serum lipids, avoiding cigarette or any type of tobacco, and the need for long term follow up was also discussed with patient. - f/u in 1 year, sooner prn  7,8. Hypertensive retinopathy OU  - discussed importance of tight BP control  - continue to monitor  9. Mixed form age related cataracts OD  - The symptoms of cataract, surgical options, and treatments and risks were discussed with patient.  - discussed diagnosis and progression  - under the expert management of Dr. Ambrosio Junker   10. Pseudophakia OS  - s/p CE/IOL OS (Dr. Ambrosio Junker, 10.23.24)  - IOL in good position  - OCT shows mild cystic changes - ?CME / post-op inflammatory component  - cont PF and Prolensa QID OS as above  - monitor  11. Dry eyes OU - recommend artificial tears and lubricating ointment as needed   Ophthalmic Meds Ordered this visit:  Meds ordered this encounter  Medications  faricimab-svoa (VABYSMO) 6mg /0.14mL intravitreal injection     Return in about 4 weeks (around 08/20/2023) for f/u exu ARMD OS, DFE, OCT, Possible Injxn.  There are no Patient Instructions on file for this visit.  This document serves as a record of services personally performed by Jeanice Millard, MD, PhD. It was created on their behalf by Olene Berne, COT an ophthalmic technician. The creation of this record is the provider's dictation and/or activities during the visit.    Electronically signed by:  Olene Berne, COT  07/27/23 11:27 PM  This document serves as a record of services personally performed by Jeanice Millard, MD, PhD. It was created on their behalf by Morley Arabia. Bevin Bucks, OA an ophthalmic technician. The creation of this record is the provider's dictation and/or activities during the visit.    Electronically signed by: Morley Arabia. Bevin Bucks, OA 07/27/23 11:27  PM   Jeanice Millard, M.D., Ph.D. Diseases & Surgery of the Retina and Vitreous [facility]  I have reviewed the above documentation for accuracy and completeness, and I agree with the above. Jeanice Millard, M.D., Ph.D. 07/27/23 11:29 PM   Abbreviations: M myopia (nearsighted); A astigmatism; H hyperopia (farsighted); P presbyopia; Mrx spectacle prescription;  CTL contact lenses; OD right eye; OS left eye; OU both eyes  XT exotropia; ET esotropia; PEK punctate epithelial keratitis; PEE punctate epithelial erosions; DES dry eye syndrome; MGD meibomian gland dysfunction; ATs artificial tears; PFAT's preservative free artificial tears; NSC nuclear sclerotic cataract; PSC posterior subcapsular cataract; ERM epi-retinal membrane; PVD posterior vitreous detachment; RD retinal detachment; DM diabetes mellitus; DR diabetic retinopathy; NPDR non-proliferative diabetic retinopathy; PDR proliferative diabetic retinopathy; CSME clinically significant macular edema; DME diabetic macular edema; dbh dot blot hemorrhages; CWS cotton wool spot; POAG primary open angle glaucoma; C/D cup-to-disc ratio; HVF humphrey visual field; GVF goldmann visual field; OCT optical coherence tomography; IOP intraocular pressure; BRVO Branch retinal vein occlusion; CRVO central retinal vein occlusion; CRAO central retinal artery occlusion; BRAO branch retinal artery occlusion; RT retinal tear; SB scleral buckle; PPV pars plana vitrectomy; VH Vitreous hemorrhage; PRP panretinal laser photocoagulation; IVK intravitreal kenalog; VMT vitreomacular traction; MH Macular hole;  NVD neovascularization of the disc; NVE neovascularization elsewhere; AREDS age related eye disease study; ARMD age related macular degeneration; POAG primary open angle glaucoma; EBMD epithelial/anterior basement membrane dystrophy; ACIOL anterior chamber intraocular lens; IOL intraocular lens; PCIOL posterior chamber intraocular lens; Phaco/IOL  phacoemulsification with intraocular lens placement; PRK photorefractive keratectomy; LASIK laser assisted in situ keratomileusis; HTN hypertension; DM diabetes mellitus; COPD chronic obstructive pulmonary disease

## 2023-07-23 ENCOUNTER — Ambulatory Visit (INDEPENDENT_AMBULATORY_CARE_PROVIDER_SITE_OTHER): Admitting: Ophthalmology

## 2023-07-23 ENCOUNTER — Encounter (INDEPENDENT_AMBULATORY_CARE_PROVIDER_SITE_OTHER): Payer: Self-pay | Admitting: Ophthalmology

## 2023-07-23 DIAGNOSIS — Z7985 Long-term (current) use of injectable non-insulin antidiabetic drugs: Secondary | ICD-10-CM | POA: Diagnosis not present

## 2023-07-23 DIAGNOSIS — H353112 Nonexudative age-related macular degeneration, right eye, intermediate dry stage: Secondary | ICD-10-CM | POA: Diagnosis not present

## 2023-07-23 DIAGNOSIS — E119 Type 2 diabetes mellitus without complications: Secondary | ICD-10-CM

## 2023-07-23 DIAGNOSIS — Z7984 Long term (current) use of oral hypoglycemic drugs: Secondary | ICD-10-CM | POA: Diagnosis not present

## 2023-07-23 DIAGNOSIS — H25811 Combined forms of age-related cataract, right eye: Secondary | ICD-10-CM

## 2023-07-23 DIAGNOSIS — H353221 Exudative age-related macular degeneration, left eye, with active choroidal neovascularization: Secondary | ICD-10-CM

## 2023-07-23 DIAGNOSIS — H3552 Pigmentary retinal dystrophy: Secondary | ICD-10-CM | POA: Diagnosis not present

## 2023-07-23 DIAGNOSIS — I10 Essential (primary) hypertension: Secondary | ICD-10-CM | POA: Diagnosis not present

## 2023-07-23 DIAGNOSIS — Z961 Presence of intraocular lens: Secondary | ICD-10-CM

## 2023-07-23 DIAGNOSIS — H35033 Hypertensive retinopathy, bilateral: Secondary | ICD-10-CM | POA: Diagnosis not present

## 2023-07-23 DIAGNOSIS — H04123 Dry eye syndrome of bilateral lacrimal glands: Secondary | ICD-10-CM

## 2023-07-23 MED ORDER — FARICIMAB-SVOA 6 MG/0.05ML IZ SOSY
6.0000 mg | PREFILLED_SYRINGE | INTRAVITREAL | Status: AC | PRN
  Administered 2023-07-23: 6 mg via INTRAVITREAL

## 2023-08-16 ENCOUNTER — Encounter: Payer: Self-pay | Admitting: Family Medicine

## 2023-08-16 ENCOUNTER — Ambulatory Visit (INDEPENDENT_AMBULATORY_CARE_PROVIDER_SITE_OTHER): Admitting: Family Medicine

## 2023-08-16 VITALS — BP 129/83 | HR 94 | Ht 73.0 in | Wt 222.0 lb

## 2023-08-16 DIAGNOSIS — Z7985 Long-term (current) use of injectable non-insulin antidiabetic drugs: Secondary | ICD-10-CM | POA: Diagnosis not present

## 2023-08-16 DIAGNOSIS — E1169 Type 2 diabetes mellitus with other specified complication: Secondary | ICD-10-CM

## 2023-08-16 DIAGNOSIS — Z860101 Personal history of adenomatous and serrated colon polyps: Secondary | ICD-10-CM

## 2023-08-16 DIAGNOSIS — E119 Type 2 diabetes mellitus without complications: Secondary | ICD-10-CM | POA: Diagnosis not present

## 2023-08-16 DIAGNOSIS — E785 Hyperlipidemia, unspecified: Secondary | ICD-10-CM

## 2023-08-16 LAB — BAYER DCA HB A1C WAIVED: HB A1C (BAYER DCA - WAIVED): 7.1 % — ABNORMAL HIGH (ref 4.8–5.6)

## 2023-08-16 MED ORDER — OZEMPIC (0.25 OR 0.5 MG/DOSE) 2 MG/3ML ~~LOC~~ SOPN: 0.2500 mg | PEN_INJECTOR | SUBCUTANEOUS | 3 refills | Status: DC

## 2023-08-16 MED ORDER — FREESTYLE LIBRE 3 PLUS SENSOR MISC: 5 refills | Status: DC

## 2023-08-16 NOTE — Progress Notes (Signed)
 BP 129/83   Pulse 94   Ht 6\' 1"  (1.854 m)   Wt 222 lb (100.7 kg)   SpO2 93%   BMI 29.29 kg/m    Subjective:   [name], male    DOB: 1955/11/06, 68 y.o.   MRN: [id number]  HPI: Eric Key is a 68 y.o. male presenting on 08/16/2023 for Medical Management of Chronic Issues and Diabetes   HPI Type 2 diabetes mellitus Patient comes in today for recheck of his diabetes. Patient has been currently taking Ozempic . Patient is not currently on an ACE inhibitor/ARB. Patient has not seen an ophthalmologist this year. Patient denies any new issues with their feet. The symptom started onset as an adult hyperlipidemia ARE RELATED TO DM.  His only real issue sometimes he gets a little bit of constipation with it.  Hyperlipidemia Patient is coming in for recheck of his hyperlipidemia. The patient is currently taking no medication currently. They deny any issues with myalgias or history of liver damage from it. They deny any focal numbness or weakness or chest pain.   Relevant past medical, surgical, family and social history reviewed and updated as indicated. Interim medical history since our last visit reviewed. Allergies and medications reviewed and updated.  Review of Systems  Constitutional:  Negative for chills and fever.  Eyes:  Negative for visual disturbance.  Respiratory:  Negative for shortness of breath and wheezing.   Cardiovascular:  Negative for chest pain and leg swelling.  Musculoskeletal:  Negative for back pain and gait problem.  Skin:  Negative for rash.  Neurological:  Negative for dizziness and light-headedness.  All other systems reviewed and are negative.   Per HPI unless specifically indicated above   Allergies as of 08/16/2023       Reactions   Penicillins Hives        Medication List        Accurate as of Aug 16, 2023  3:51 PM. If you have any questions, ask your nurse or doctor.          STOP taking these medications     ketorolac  0.5 % ophthalmic solution Commonly known as: ACULAR  Stopped by: Elber Galyean A Lakeitha Basques   prednisoLONE  acetate 1 % ophthalmic suspension Commonly known as: PRED FORTE  Stopped by: Lucio Sabin Juel Ripley   rosuvastatin  5 MG tablet Commonly known as: CRESTOR  Stopped by: Lucio Sabin [employer]       TAKE these medications    FreeStyle Libre 3 Plus Sensor Misc 1 sensor change every 15 days.   Ozempic  (0.25 or 0.5 MG/DOSE) 2 MG/3ML Sopn Generic drug: Semaglutide (0.25 or 0.5MG /DOS) Inject 0.25 mg into the skin once a week. What changed: See the new instructions. Changed by: Lucio Sabin Kymani Laursen         Objective:   BP 129/83   Pulse 94   Ht 6\' 1"  (1.854 m)   Wt 222 lb (100.7 kg)   SpO2 93%   BMI 29.29 kg/m   Wt Readings from Last 3 Encounters:  08/16/23 222 lb (100.7 kg)  05/15/23 226 lb (102.5 kg)  03/28/23 235 lb 9.6 oz (106.9 kg)    Physical Exam Vitals and nursing note reviewed.  Constitutional:      General: He is not in acute distress.    Appearance: He is well-developed. He is not diaphoretic.  Eyes:     General: No scleral icterus.    Conjunctiva/sclera: Conjunctivae normal.  Neck:     Thyroid: No  thyromegaly.  Cardiovascular:     Rate and Rhythm: Normal rate and regular rhythm.     Heart sounds: Normal heart sounds. No murmur heard. Pulmonary:     Effort: Pulmonary effort is normal. No respiratory distress.     Breath sounds: Normal breath sounds. No wheezing.  Musculoskeletal:        General: No swelling. Normal range of motion.     Cervical back: Neck supple.  Lymphadenopathy:     Cervical: No cervical adenopathy.  Skin:    General: Skin is warm and dry.     Findings: No rash.  Neurological:     Mental Status: He is alert and oriented to person, place, and time.     Coordination: Coordination normal.  Psychiatric:        Behavior: Behavior normal.       Assessment & Plan:   Problem List Items Addressed This Visit       Endocrine    Hyperlipidemia associated with type 2 diabetes mellitus (HCC)   Relevant Medications   Semaglutide ,0.25 or 0.5MG /DOS, (OZEMPIC , 0.25 OR 0.5 MG/DOSE,) 2 MG/3ML SOPN   Type 2 diabetes mellitus with other specified complication (HCC) - Primary   Relevant Medications   Semaglutide ,0.25 or 0.5MG /DOS, (OZEMPIC , 0.25 OR 0.5 MG/DOSE,) 2 MG/3ML SOPN   Other Relevant Orders   Bayer DCA Hb A1c Waived     Other   History of adenomatous polyps of colon    A1c looks good at 5.0. Recommended that he back down on the Ozempic  to 0.25  Follow up plan: Return in about 3 months (around 11/16/2023), or if symptoms worsen or fail to improve, for Diabetes recheck.  Counseling provided for all of the vaccine components Orders Placed This Encounter  Procedures   Bayer DCA Hb A1c Waived    Jolyne Needs, MD [facility] 08/16/2023, 3:51 PM

## 2023-08-19 ENCOUNTER — Telehealth: Payer: Self-pay

## 2023-08-19 ENCOUNTER — Other Ambulatory Visit (HOSPITAL_COMMUNITY): Payer: Self-pay

## 2023-08-19 NOTE — Telephone Encounter (Signed)
 Pharmacy Patient Advocate Encounter   Received notification from Onbase that prior authorization for FreeStyle Libre 3 Plus Sensor is required/requested.   Insurance verification completed.   The patient is insured through [employer] .   Per test claim: PA required; PA submitted to above mentioned insurance via CoverMyMeds Key/confirmation #/EOC [id number] Status is pending

## 2023-08-19 NOTE — Telephone Encounter (Signed)
 Pharmacy Patient Advocate Encounter  Received notification from [employer] that Prior Authorization for FreeStyle Libre 3 Plus Sensor has been DENIED.  See denial reason below. No denial letter attached in CMM. Will attach denial letter to Media tab once received.   PA #/Case ID/Reference #: [id number]

## 2023-08-20 ENCOUNTER — Encounter (INDEPENDENT_AMBULATORY_CARE_PROVIDER_SITE_OTHER): Admitting: Ophthalmology

## 2023-08-20 ENCOUNTER — Encounter (INDEPENDENT_AMBULATORY_CARE_PROVIDER_SITE_OTHER): Payer: Self-pay

## 2023-08-20 ENCOUNTER — Other Ambulatory Visit (HOSPITAL_COMMUNITY): Payer: Self-pay

## 2023-08-20 DIAGNOSIS — H353221 Exudative age-related macular degeneration, left eye, with active choroidal neovascularization: Secondary | ICD-10-CM

## 2023-08-20 DIAGNOSIS — Z961 Presence of intraocular lens: Secondary | ICD-10-CM

## 2023-08-20 DIAGNOSIS — H04123 Dry eye syndrome of bilateral lacrimal glands: Secondary | ICD-10-CM

## 2023-08-20 DIAGNOSIS — E119 Type 2 diabetes mellitus without complications: Secondary | ICD-10-CM

## 2023-08-20 DIAGNOSIS — I10 Essential (primary) hypertension: Secondary | ICD-10-CM

## 2023-08-20 DIAGNOSIS — H25811 Combined forms of age-related cataract, right eye: Secondary | ICD-10-CM

## 2023-08-20 DIAGNOSIS — H35033 Hypertensive retinopathy, bilateral: Secondary | ICD-10-CM

## 2023-08-20 DIAGNOSIS — H353112 Nonexudative age-related macular degeneration, right eye, intermediate dry stage: Secondary | ICD-10-CM

## 2023-08-20 DIAGNOSIS — H3552 Pigmentary retinal dystrophy: Secondary | ICD-10-CM

## 2023-08-20 DIAGNOSIS — Z7984 Long term (current) use of oral hypoglycemic drugs: Secondary | ICD-10-CM

## 2023-08-20 DIAGNOSIS — Z7985 Long-term (current) use of injectable non-insulin antidiabetic drugs: Secondary | ICD-10-CM

## 2023-08-21 NOTE — Telephone Encounter (Signed)
 Please let patient know that based on insurance he is not qualifying for the freestyle libre at this point.  He can buy it from the pharmacy without insurance using goodrx.com, I think it can be as low as $75-$90 a month for this.

## 2023-08-21 NOTE — Telephone Encounter (Signed)
 Pt made aware and understood. States that he has been getting it for $80/month.

## 2023-08-21 NOTE — Progress Notes (Addendum)
 [facility] - Clinic Note  08/22/2023    CHIEF COMPLAINT Patient presents for Retina Follow Up  HISTORY OF PRESENT ILLNESS: Eric Key is a 68 y.o. male who presents to the clinic today for:   HPI     Retina Follow Up   Patient presents with  Wet AMD.  In left eye.  Severity is moderate.  Duration of 4 weeks.  Since onset it is stable.  I, the attending physician,  performed the HPI with the patient and updated documentation appropriately.        Comments   Pt here for 4 wk ret f/u exu ARMD OS. Pt states VA is the same, no changes noticed.       Last edited by Eric Coffee, MD on 08/22/2023  8:24 AM.      Pt states his floaters are better   Referring physician: [name], Lucio Sabin, MD [address] [place],  [place] [address]  HISTORICAL INFORMATION:   Selected notes from the MEDICAL RECORD NUMBER Referred by Dr. Derril Key for concern of mac heme OS.   CURRENT MEDICATIONS: No current outpatient medications on file. (Ophthalmic Drugs)   No current facility-administered medications for this visit. (Ophthalmic Drugs)   Current Outpatient Medications (Other)  Medication Sig   Continuous Glucose Sensor (FREESTYLE LIBRE 3 PLUS SENSOR) MISC 1 sensor change every 15 days.   Semaglutide ,0.25 or 0.5MG /DOS, (OZEMPIC , 0.25 OR 0.5 MG/DOSE,) 2 MG/3ML SOPN Inject 0.25 mg into the skin once a week.   No current facility-administered medications for this visit. (Other)   REVIEW OF SYSTEMS: ROS   Positive for: Endocrine, Eyes Negative for: Constitutional, Gastrointestinal, Neurological, Skin, Genitourinary, Musculoskeletal, HENT, Cardiovascular, Respiratory, Psychiatric, Allergic/Imm, Heme/Lymph Last edited by Eric Key, COT on 08/22/2023  7:52 AM.        ALLERGIES Allergies  Allergen Reactions   Penicillins Hives   PAST MEDICAL HISTORY Past Medical History:  Diagnosis Date   Allergy    Penicillin   Cataract    Mixed form OU    History of adenomatous polyps of colon 03/16/2020   Hypertension    past hx    Hypertensive retinopathy    OU   Macular degeneration    Dry OD; Wet OS   Past Surgical History:  Procedure Laterality Date   ACHILLES TENDON REPAIR  2010   CHOLECYSTECTOMY     COLONOSCOPY     EYE SURGERY     GALLBLADDER SURGERY     FAMILY HISTORY Family History  Problem Relation Age of Onset   Diabetes Brother    Heart disease Brother    Diabetes Maternal Grandfather    Colon polyps Neg Hx    Colon cancer Neg Hx    Esophageal cancer Neg Hx    Rectal cancer Neg Hx    Stomach cancer Neg Hx    SOCIAL HISTORY Social History   Tobacco Use   Smoking status: Some Days    Current packs/day: 0.75    Average packs/day: 0.8 packs/day for 41.0 years (30.8 ttl pk-yrs)    Types: Cigars, Cigarettes   Smokeless tobacco: Never  Vaping Use   Vaping status: Never Used  Substance Use Topics   Alcohol use: Not Currently    Comment: occ   Drug use: Not Currently       OPHTHALMIC EXAM: Base Eye Exam     Visual Acuity (Snellen - Linear)       Right Left   Dist  cc 20/20 -2 20/30    Correction: Glasses         Tonometry (Tonopen, 7:56 AM)       Right Left   Pressure 17 19         Pupils       Pupils Dark Light Shape React APD   Right PERRL 3 2 Round Brisk None   Left PERRL 3 2 Round Brisk None         Visual Fields       Left Right    Full Full         Extraocular Movement       Right Left    Full, Ortho Full, Ortho         Neuro/Psych     Oriented x3: Yes   Mood/Affect: Normal         Dilation     Both eyes: 1.0% Mydriacyl, 2.5% Phenylephrine @ 7:56 AM           Slit Lamp and Fundus Exam     Slit Lamp Exam       Right Left   Lids/Lashes Dermatochalasis - upper lid Dermatochalasis - upper lid   Conjunctiva/Sclera White and quiet White and quiet   Cornea trace PEE, mild Arcus, tear film debris mild Arcus, 2+ fine punctate epithelial erosions  inferiorly, tear film debris -- improved, well healed cataract wound   Anterior Chamber deep, clear, narrow angles deep, 0.5+cell/pigment   Iris Round and dilated, mild anterior bowing Round and moderately dilated, mild anterior bowing   Lens 2-3+ Nuclear sclerosis, 2-3+ Cortical cataract PC IOL in good position   Anterior Vitreous Vitreous syneresis Vitreous syneresis, fine cell/pigment         Fundus Exam       Right Left   Disc Pink and Sharp, focal PPP ST, Compact Pink and Sharp, Compact   C/D Ratio 0.2 0.4   Macula Flat, Drusen, RPE mottling and clumping, early Atrophy, punctate IRH abnormal foveal reflex, +CNV with heme stably improved, +central edema / cystic changes -- slightly improved, Drusen, RPE mottling and clumping, early subretinal fibrosis, no heme   Vessels attenuated, tortuous, perivascular pigment clumping peripherally, mild AV crossing changes attenuated, mild tortuousity, perivascular pigment clumping peripherally   Periphery Attached, peripheral pigmented CR scarring / atrophy and perivascular bone spicules 360, No heme Attached, peripheral pigmented CR scarring / atrophy and perivascular bone spicules 360, No heme           Refraction     Wearing Rx       Sphere Cylinder Axis Add   Right +0.25 +1.50 003 +2.00   Left +0.25 +0.75 169 +2.00           IMAGING AND PROCEDURES  Imaging and Procedures for @TODAY @  OCT, Retina - OU - Both Eyes        Right Eye Quality was good. Central Foveal Thickness: 310. Progression has been stable. Findings include normal foveal contour, no IRF, no SRF, retinal drusen , pigment epithelial detachment, outer retinal atrophy (Persistent vitreous opacities; stable low lying PEDs and ORA, partial PVD).   Left Eye Quality was good. Central Foveal Thickness: 406. Progression has improved. Findings include abnormal foveal contour, retinal drusen , subretinal hyper-reflective material, intraretinal fluid, pigment epithelial  detachment, outer retinal atrophy (persistent IRF overlying stable low-lying PED / SRHM temporal fovea and macula -- slightly improved).   Notes  *Images captured and stored on drive  Diagnosis / Impression:  OD: non-exu ARMD w/ stable low lying PEDs and ORA OS: exu ARMD -- persistent IRF overlying stable low-lying PED / SRHM temporal fovea and macula -- slightly improved  Clinical management:  See below  Abbreviations: NFP - Normal foveal profile. CME - cystoid macular edema. PED - pigment epithelial detachment. IRF - intraretinal fluid. SRF - subretinal fluid. EZ - ellipsoid zone. ERM - epiretinal membrane. ORA - outer retinal atrophy. ORT - outer retinal tubulation. SRHM - subretinal hyper-reflective material      Intravitreal Injection, Pharmacologic Agent - OS - Left Eye        Time Out 08/22/2023. 8:38 AM. Confirmed correct patient, procedure, site, and patient consented.   Anesthesia Topical anesthesia was used. Anesthetic medications included Lidocaine  2%, Proparacaine 0.5%.   Procedure Preparation included 5% betadine to ocular surface, eyelid speculum. A (32g) needle was used.   Injection: 6 mg faricimab -svoa 6 MG/0.05ML (Patient supplied)   Route: Intravitreal, Site: Left Eye   NDC: 96295-284-13, Lot: [id number], Expiration date: 01/13/2025, Waste: 0 mL   Post-op Post injection exam found visual acuity of at least counting fingers. The patient tolerated the procedure well. There were no complications. The patient received written and verbal post procedure care education. Post injection medications were not given.   Notes  **SAMPLE MEDICATION ADMINISTERED**            ASSESSMENT/PLAN:   ICD-10-CM   1. Exudative age-related macular degeneration of left eye with active choroidal neovascularization (HCC)  H35.3221 OCT, Retina - OU - Both Eyes    Intravitreal Injection, Pharmacologic Agent - OS - Left Eye    faricimab -svoa (VABYSMO ) 6mg /0.55mL intravitreal  injection    2. 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 Diabetes mellitus type 2 without retinopathy (HCC)  E11.9     5. Long term (current) use of oral hypoglycemic drugs  Z79.84     6. Long-term (current) use of injectable non-insulin antidiabetic drugs  Z79.85     7. Essential hypertension  I10     8. Hypertensive retinopathy of both eyes  H35.033     9. Combined forms of age-related cataract of right eye  H25.811     10. Pseudophakia  Z96.1     11. Dry eyes  H04.123       1. 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, OS  - s/p IVA OS #1 (03.04.21), #2 (03.04.21), #3 (04.30.21), #4 (01.07.25)  - s/p IVE OS #1 (06.04.21), #2 (07.08.21), #3 (08.09.21), #4 (09.13.21), #5 (10.18.21), #6 (11.24.21), #7 (12.31.21), #8 (02.11.22), #9 (03.25.22), #10 (05.02.22), #11 (06.06.22), #12 (07.13.22), #13 (08.17.22), #14 (09.21.22), #15 (10.26.22), #16 (11.29.22), #17 (01.03.23), #18 (02.07.23), #19 (03.16.23), #20 (04.20.23), #21 (05.19.23), #22 (06.20.23) -- IVE resistance  - s/p IVV OS #1 (07.18.23), #2 (08.15.23), #3 (09.12.23), #4 (10.10.23), #5 (11.14.23), #6 (12.21.23), #7 (01.25.24), #8 (02.29.24), #9 (04.11.24), #10 (05.20.24), #11 (07.01.24), #12 (08.13.24), #13 (10.01.24), #14 (11.26.24), 15 (sample 02.04.25), #16 (sample 03.11.25), #17 (04.08.25)  - exam shows central CNV with +subretinal heme -- improved  - BCVA OS 20/30 - stable  - OCT shows interval improvement in IRF overlying stable low-lying PED / [facility] temporal fovea and macula at 4 weeks   - recommend IVV OS #18 [sample] today 05.08.25 with follow up in 4 weeks  - pt in agreement and wishes to proceed with injection  - RBA of procedure discussed, questions answered - see  procedure note  - IVV informed consent obtained and signed, 10.01.24  - cont PF and Prolensa QID OS for possible CME/post-op inflammatory component   - Vabysmo  and  Eylea  HD approved, but pt owes 20% due to [employer] funding being unavailable  - f/u 4 weeks -- DFE/OCT, possible injection  2. Age related macular degeneration, non-exudative, OD  - The incidence, anatomy, and pathology of dry AMD, risk of progression, and the AREDS and AREDS 2 study including smoking risks discussed with patient.   - recommend Amsler grid monitoring  3. Pigmentary retinopathy OU  - peripheral pigmented CR scarring and perivascular pigment clumping / bone spicules OU  - VA remains fairly good (20/20 OD, 20/40 OS with exudative ARMD OS)  - denies night blindness  - denies family history of vision problems  - discussed findings  - broad differential for pigmentary retinopathy:  Congenital (RP), infectious (TB, lyme dx, syphilis, toxo, bartonella), autoimmune and toxic etiologies  - labs checked (4.2.21):  All essential WNL   Rheumatoid factor   CBC   Comprehensive metabolic panel     RPR   ANA   ESR, CRP   ACE   ANCA   Toxoplasma antibodies IgG / IgM   Quantiferon - TB Gold Plus   HLA, A, B, C (IR)   Fluorescent treponemal ab (fla) - IgG - bld   VDRL Serum   Bartonella antibody, 06.04.21  - [facility] -- drawn on 6.4.21                         Results Positive for AGBL5, ALMS1, OPA1 -- unknown significance              - monitor  4-6. Diabetes mellitus, type 2 without retinopathy  - dx in October 2024 -- A1c 11.9 on 10.16.24  - on Ozempic  and metformin  - The incidence, risk factors for progression, natural history and treatment options for diabetic retinopathy  were discussed with patient.   - The need for close monitoring of blood glucose, blood pressure, and serum lipids, avoiding cigarette or any type of tobacco, and the need for long term follow up was also discussed with patient. - f/u in 1 year, sooner prn  7,8. Hypertensive retinopathy OU  - discussed importance of tight BP control  - continue to monitor  9. Mixed form age related  cataracts OD  - The symptoms of cataract, surgical options, and treatments and risks were discussed with patient.  - discussed diagnosis and progression  - under the expert management of Dr. Ambrosio Junker  10. Pseudophakia OS  - s/p CE/IOL OS (Dr. Ambrosio Junker, 10.23.24)  - IOL in good position  - OCT shows mild cystic changes - ?CME / post-op inflammatory component  - cont PF and Prolensa QID OS as above  - monitor  11. Dry eyes OU - recommend artificial tears and lubricating ointment as needed  Ophthalmic Meds Ordered this visit:  Meds ordered this encounter  Medications   faricimab -svoa (VABYSMO ) 6mg /0.94mL intravitreal injection     Return in about 4 weeks (around 09/19/2023) for f/u exu ARMD OS, DFE, OCT, Possible Injxn.  There are no Patient Instructions on file for this visit.  This document serves as a record of services personally performed by Jeanice Millard, MD, PhD. It was created on their behalf by Diona Franklin, COMT. The creation of this record is the provider's dictation and/or activities during the visit.  Electronically signed  by: Diona Franklin, COMT 08/24/23 2:24 AM  This document serves as a record of services personally performed by Jeanice Millard, MD, PhD. It was created on their behalf by Morley Arabia. Bevin Bucks, OA an ophthalmic technician. The creation of this record is the provider's dictation and/or activities during the visit.    Electronically signed by: Morley Arabia. Bevin Bucks, OA 08/24/23 2:24 AM  Jeanice Millard, M.D., Ph.D. Diseases & Surgery of the Retina and Vitreous [facility]  I have reviewed the above documentation for accuracy and completeness, and I agree with the above. Jeanice Millard, M.D., Ph.D. 08/24/23 2:24 AM   Abbreviations: M myopia (nearsighted); A astigmatism; H hyperopia (farsighted); P presbyopia; Mrx spectacle prescription;  CTL contact lenses; OD right eye; OS left eye; OU both eyes  XT exotropia; ET esotropia; PEK punctate  epithelial keratitis; PEE punctate epithelial erosions; DES dry eye syndrome; MGD meibomian gland dysfunction; ATs artificial tears; PFAT's preservative free artificial tears; NSC nuclear sclerotic cataract; PSC posterior subcapsular cataract; ERM epi-retinal membrane; PVD posterior vitreous detachment; RD retinal detachment; DM diabetes mellitus; DR diabetic retinopathy; NPDR non-proliferative diabetic retinopathy; PDR proliferative diabetic retinopathy; CSME clinically significant macular edema; DME diabetic macular edema; dbh dot blot hemorrhages; CWS cotton wool spot; POAG primary open angle glaucoma; C/D cup-to-disc ratio; HVF humphrey visual field; GVF goldmann visual field; OCT optical coherence tomography; IOP intraocular pressure; BRVO Branch retinal vein occlusion; CRVO central retinal vein occlusion; CRAO central retinal artery occlusion; BRAO branch retinal artery occlusion; RT retinal tear; SB scleral buckle; PPV pars plana vitrectomy; VH Vitreous hemorrhage; PRP panretinal laser photocoagulation; IVK intravitreal kenalog; VMT vitreomacular traction; MH Macular hole;  NVD neovascularization of the disc; NVE neovascularization elsewhere; AREDS age related eye disease study; ARMD age related macular degeneration; POAG primary open angle glaucoma; EBMD epithelial/anterior basement membrane dystrophy; ACIOL anterior chamber intraocular lens; IOL intraocular lens; PCIOL posterior chamber intraocular lens; Phaco/IOL phacoemulsification with intraocular lens placement; PRK photorefractive keratectomy; LASIK laser assisted in situ keratomileusis; HTN hypertension; DM diabetes mellitus; COPD chronic obstructive pulmonary disease

## 2023-08-22 ENCOUNTER — Encounter (INDEPENDENT_AMBULATORY_CARE_PROVIDER_SITE_OTHER): Payer: Self-pay | Admitting: Ophthalmology

## 2023-08-22 ENCOUNTER — Ambulatory Visit (INDEPENDENT_AMBULATORY_CARE_PROVIDER_SITE_OTHER): Admitting: Ophthalmology

## 2023-08-22 DIAGNOSIS — H353221 Exudative age-related macular degeneration, left eye, with active choroidal neovascularization: Secondary | ICD-10-CM | POA: Diagnosis not present

## 2023-08-22 DIAGNOSIS — E119 Type 2 diabetes mellitus without complications: Secondary | ICD-10-CM | POA: Diagnosis not present

## 2023-08-22 DIAGNOSIS — H3552 Pigmentary retinal dystrophy: Secondary | ICD-10-CM

## 2023-08-22 DIAGNOSIS — Z7984 Long term (current) use of oral hypoglycemic drugs: Secondary | ICD-10-CM | POA: Diagnosis not present

## 2023-08-22 DIAGNOSIS — I10 Essential (primary) hypertension: Secondary | ICD-10-CM

## 2023-08-22 DIAGNOSIS — Z7985 Long-term (current) use of injectable non-insulin antidiabetic drugs: Secondary | ICD-10-CM | POA: Diagnosis not present

## 2023-08-22 DIAGNOSIS — H35033 Hypertensive retinopathy, bilateral: Secondary | ICD-10-CM

## 2023-08-22 DIAGNOSIS — Z961 Presence of intraocular lens: Secondary | ICD-10-CM

## 2023-08-22 DIAGNOSIS — H353112 Nonexudative age-related macular degeneration, right eye, intermediate dry stage: Secondary | ICD-10-CM | POA: Diagnosis not present

## 2023-08-22 DIAGNOSIS — H04123 Dry eye syndrome of bilateral lacrimal glands: Secondary | ICD-10-CM

## 2023-08-22 DIAGNOSIS — H25811 Combined forms of age-related cataract, right eye: Secondary | ICD-10-CM | POA: Diagnosis not present

## 2023-08-22 MED ORDER — FARICIMAB-SVOA 6 MG/0.05ML IZ SOLN
6.0000 mg | INTRAVITREAL | Status: AC | PRN
  Administered 2023-08-22: 6 mg via INTRAVITREAL

## 2023-09-11 NOTE — Progress Notes (Signed)
 [facility] - Clinic Note  09/19/2023    CHIEF COMPLAINT Patient presents for Retina Follow Up  HISTORY OF PRESENT ILLNESS: Eric Key is a 68 y.o. male who presents to the clinic today for:   HPI     Retina Follow Up   Patient presents with  Wet AMD.  In left eye.  This started 4 weeks ago.  I, the attending physician,  performed the HPI with the patient and updated documentation appropriately.        Comments   Patient here for 4 weeks retina follow up for exu ARMD OS. Patient states vision doing fine. No eye pain.      Last edited by Ronelle Coffee, MD on 09/19/2023 12:38 PM.    Pt states vision is stable, he saw Dr. Ambrosio Junker on Tuesday   Referring physician: [name], Lucio Sabin, MD [address] [place],  [place] [address]  HISTORICAL INFORMATION:   Selected notes from the MEDICAL RECORD NUMBER Referred by Dr. Derril Flint for concern of mac heme OS.   CURRENT MEDICATIONS: No current outpatient medications on file. (Ophthalmic Drugs)   No current facility-administered medications for this visit. (Ophthalmic Drugs)   Current Outpatient Medications (Other)  Medication Sig   Continuous Glucose Sensor (FREESTYLE LIBRE 3 PLUS SENSOR) MISC 1 sensor change every 15 days.   Semaglutide ,0.25 or 0.5MG /DOS, (OZEMPIC , 0.25 OR 0.5 MG/DOSE,) 2 MG/3ML SOPN Inject 0.25 mg into the skin once a week.   No current facility-administered medications for this visit. (Other)   REVIEW OF SYSTEMS: ROS   Positive for: Endocrine, Eyes Negative for: Constitutional, Gastrointestinal, Neurological, Skin, Genitourinary, Musculoskeletal, HENT, Cardiovascular, Respiratory, Psychiatric, Allergic/Imm, Heme/Lymph Last edited by Sylvan Evener, COA on 09/19/2023  8:03 AM.         ALLERGIES Allergies  Allergen Reactions   Penicillins Hives   PAST MEDICAL HISTORY Past Medical History:  Diagnosis Date   Allergy    Penicillin   Cataract    Mixed form OU   History of  adenomatous polyps of colon 03/16/2020   Hypertension    past hx    Hypertensive retinopathy    OU   Macular degeneration    Dry OD; Wet OS   Past Surgical History:  Procedure Laterality Date   ACHILLES TENDON REPAIR  2010   CHOLECYSTECTOMY     COLONOSCOPY     EYE SURGERY     GALLBLADDER SURGERY     FAMILY HISTORY Family History  Problem Relation Age of Onset   Diabetes Brother    Heart disease Brother    Diabetes Maternal Grandfather    Colon polyps Neg Hx    Colon cancer Neg Hx    Esophageal cancer Neg Hx    Rectal cancer Neg Hx    Stomach cancer Neg Hx    SOCIAL HISTORY Social History   Tobacco Use   Smoking status: Some Days    Current packs/day: 0.75    Average packs/day: 0.8 packs/day for 41.0 years (30.8 ttl pk-yrs)    Types: Cigars, Cigarettes   Smokeless tobacco: Never  Vaping Use   Vaping status: Never Used  Substance Use Topics   Alcohol use: Not Currently    Comment: occ   Drug use: Not Currently       OPHTHALMIC EXAM: Base Eye Exam     Visual Acuity (Snellen - Linear)       Right Left   Dist cc 20/20 20/30 -2  Correction: Glasses         Tonometry (Tonopen, 8:01 AM)       Right Left   Pressure 17 12         Pupils       Dark Light Shape React APD   Right 3 2 Round Brisk None   Left 3 2 Round Brisk None         Visual Fields (Counting fingers)       Left Right    Full Full         Extraocular Movement       Right Left    Full, Ortho Full, Ortho         Neuro/Psych     Oriented x3: Yes   Mood/Affect: Normal         Dilation     Both eyes: 1.0% Mydriacyl, 2.5% Phenylephrine @ 8:01 AM           Slit Lamp and Fundus Exam     Slit Lamp Exam       Right Left   Lids/Lashes Dermatochalasis - upper lid Dermatochalasis - upper lid   Conjunctiva/Sclera White and quiet White and quiet   Cornea trace PEE, mild Arcus, tear film debris mild Arcus, 2+ fine punctate epithelial erosions inferiorly, tear  film debris -- improved, well healed cataract wound   Anterior Chamber deep, clear, narrow angles deep, 0.5+cell/pigment   Iris Round and dilated, mild anterior bowing Round and moderately dilated, mild anterior bowing   Lens 2-3+ Nuclear sclerosis, 2-3+ Cortical cataract PC IOL in good position   Anterior Vitreous Vitreous syneresis Vitreous syneresis, fine cell/pigment         Fundus Exam       Right Left   Disc Pink and Sharp, focal PPP ST, Compact Pink and Sharp, Compact   C/D Ratio 0.2 0.4   Macula Flat, Drusen, RPE mottling and clumping, early Atrophy, punctate IRH abnormal foveal reflex, +CNV with heme stably improved, +central edema / cystic changes -- slightly improved, Drusen, RPE mottling and clumping, early subretinal fibrosis, no heme   Vessels attenuated, tortuous, perivascular pigment clumping peripherally, mild AV crossing changes attenuated, mild tortuousity, perivascular pigment clumping peripherally   Periphery Attached, peripheral pigmented CR scarring / atrophy and perivascular bone spicules 360, No heme Attached, peripheral pigmented CR scarring / atrophy and perivascular bone spicules 360, No heme           Refraction     Wearing Rx       Sphere Cylinder Axis Add   Right +0.25 +1.50 003 +2.00   Left +0.25 +0.75 169 +2.00           IMAGING AND PROCEDURES  Imaging and Procedures for @TODAY @  Intravitreal Injection, Pharmacologic Agent - OS - Left Eye       Time Out 09/19/2023. 8:38 AM. Confirmed correct patient, procedure, site, and patient consented.   Anesthesia Topical anesthesia was used. Anesthetic medications included Lidocaine  2%, Proparacaine 0.5%.   Procedure Preparation included 5% betadine to ocular surface, eyelid speculum. A (32g) needle was used.   Injection: 6 mg faricimab -svoa 6 MG/0.05ML (Patient supplied)   Route: Intravitreal, Site: Left Eye   NDC: 40981-191-47, Lot: [id number], Expiration date: 01/13/2025, Waste: 0 mL    Post-op Post injection exam found visual acuity of at least counting fingers. The patient tolerated the procedure well. There were no complications. The patient received written and verbal post procedure care education. Post injection medications were not  given.   Notes **SAMPLE MEDICATION ADMINISTERED**     OCT, Retina - OU - Both Eyes       Right Eye Quality was good. Central Foveal Thickness: 307. Progression has been stable. Findings include normal foveal contour, no IRF, no SRF, retinal drusen , pigment epithelial detachment, outer retinal atrophy (Persistent vitreous opacities; stable low lying PEDs and ORA, partial PVD).   Left Eye Quality was good. Central Foveal Thickness: 408. Progression has been stable. Findings include abnormal foveal contour, retinal drusen , subretinal hyper-reflective material, intraretinal fluid, pigment epithelial detachment, outer retinal atrophy (persistent IRF overlying stable low-lying PED / SRHM temporal fovea and macula).   Notes *Images captured and stored on drive  Diagnosis / Impression:  OD: non-exu ARMD w/ stable low lying PEDs and ORA OS: exu ARMD -- persistent IRF overlying stable low-lying PED / SRHM temporal fovea and macula  Clinical management:  See below  Abbreviations: NFP - Normal foveal profile. CME - cystoid macular edema. PED - pigment epithelial detachment. IRF - intraretinal fluid. SRF - subretinal fluid. EZ - ellipsoid zone. ERM - epiretinal membrane. ORA - outer retinal atrophy. ORT - outer retinal tubulation. SRHM - subretinal hyper-reflective material             ASSESSMENT/PLAN:   ICD-10-CM   1. Exudative age-related macular degeneration of left eye with active choroidal neovascularization (HCC)  H35.3221 Intravitreal Injection, Pharmacologic Agent - OS - Left Eye    OCT, Retina - OU - Both Eyes    faricimab -svoa (VABYSMO ) 6mg /0.53mL intravitreal injection    2. 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 Diabetes mellitus type 2 without retinopathy (HCC)  E11.9     5. Long term (current) use of oral hypoglycemic drugs  Z79.84     6. Long-term (current) use of injectable non-insulin antidiabetic drugs  Z79.85     7. Essential hypertension  I10     8. Hypertensive retinopathy of both eyes  H35.033     9. Combined forms of age-related cataract of right eye  H25.811     10. Pseudophakia  Z96.1     11. Dry eyes  H04.123       1. 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, OS  - s/p IVA OS #1 (03.04.21), #2 (03.04.21), #3 (04.30.21), #4 (01.07.25)  - s/p IVE OS #1 (06.04.21), #2 (07.08.21), #3 (08.09.21), #4 (09.13.21), #5 (10.18.21), #6 (11.24.21), #7 (12.31.21), #8 (02.11.22), #9 (03.25.22), #10 (05.02.22), #11 (06.06.22), #12 (07.13.22), #13 (08.17.22), #14 (09.21.22), #15 (10.26.22), #16 (11.29.22), #17 (01.03.23), #18 (02.07.23), #19 (03.16.23), #20 (04.20.23), #21 (05.19.23), #22 (06.20.23) -- IVE resistance  - s/p IVV OS #1 (07.18.23), #2 (08.15.23), #3 (09.12.23), #4 (10.10.23), #5 (11.14.23), #6 (12.21.23), #7 (01.25.24), #8 (02.29.24), #9 (04.11.24), #10 (05.20.24), #11 (07.01.24), #12 (08.13.24), #13 (10.01.24), #14 (11.26.24), 15 (sample 02.04.25), #16 (sample 03.11.25), #17 (04.08.25), #18 (sample, 05.08.25)  - exam shows central CNV with +subretinal heme -- improved  - BCVA OS 20/30 - stable  - OCT shows persistent IRF overlying stable low-lying PED / SRHM temporal fovea and macula at 4 weeks   - recommend IVV OS #19 [sample] today 06.05.25 with follow up in 4 weeks -- will switch to IVE HD if no improvement in edema at next visit  - pt in agreement and wishes to proceed with injection  - RBA of procedure discussed, questions answered - see procedure note  -  IVV informed consent obtained and signed, 10.01.24  - cont PF and Prolensa QID OS for possible CME/post-op inflammatory component    - Vabysmo  and Eylea  HD approved, but pt owes 20% due to [employer] funding being unavailable  - f/u 4 weeks -- DFE/OCT, possible injection  2. Age related macular degeneration, non-exudative, OD  - The incidence, anatomy, and pathology of dry AMD, risk of progression, and the AREDS and AREDS 2 study including smoking risks discussed with patient.   - recommend Amsler grid monitoring  3. Pigmentary retinopathy OU  - peripheral pigmented CR scarring and perivascular pigment clumping / bone spicules OU  - VA remains fairly good (20/20 OD, 20/40 OS with exudative ARMD OS)  - denies night blindness  - denies family history of vision problems  - discussed findings  - broad differential for pigmentary retinopathy:  Congenital (RP), infectious (TB, lyme dx, syphilis, toxo, bartonella), autoimmune and toxic etiologies  - labs checked (4.2.21):  All essential WNL   Rheumatoid factor   CBC   Comprehensive metabolic panel     RPR   ANA   ESR, CRP   ACE   ANCA   Toxoplasma antibodies IgG / IgM   Quantiferon - TB Gold Plus   HLA, A, B, C (IR)   Fluorescent treponemal ab (fla) - IgG - bld   VDRL Serum   Bartonella antibody, 06.04.21  - [facility] -- drawn on 6.4.21                         Results Positive for AGBL5, ALMS1, OPA1 -- unknown significance              - monitor  4-6. Diabetes mellitus, type 2 without retinopathy  - dx in October 2024 -- A1c 11.9 on 10.16.24  - on Ozempic  and metformin  - The incidence, risk factors for progression, natural history and treatment options for diabetic retinopathy  were discussed with patient.   - The need for close monitoring of blood glucose, blood pressure, and serum lipids, avoiding cigarette or any type of tobacco, and the need for long term follow up was also discussed with patient. - f/u in 1 year, sooner prn  7,8. Hypertensive retinopathy OU  - discussed importance of tight BP control  - continue to monitor  9. Mixed form age  related cataracts OD  - The symptoms of cataract, surgical options, and treatments and risks were discussed with patient.  - discussed diagnosis and progression  - under the expert management of Dr. Ambrosio Junker  10. Pseudophakia OS  - s/p CE/IOL OS (Dr. Ambrosio Junker, 10.23.24)  - IOL in good position  - OCT shows mild cystic changes - ?CME / post-op inflammatory component  - cont PF and Prolensa QID OS as above  - monitor  11. Dry eyes OU - recommend artificial tears and lubricating ointment as needed  Ophthalmic Meds Ordered this visit:  Meds ordered this encounter  Medications   faricimab -svoa (VABYSMO ) 6mg /0.74mL intravitreal injection     Return in about 4 weeks (around 10/17/2023) for f/u exu ARMD OS, DFE, OCT, Possible Injxn.  There are no Patient Instructions on file for this visit.  This document serves as a record of services personally performed by Jeanice Millard, MD, PhD. It was created on their behalf by Morley Arabia. Bevin Bucks, OA an ophthalmic technician. The creation of this record is the provider's dictation and/or activities during the visit.  Electronically signed by: Morley Arabia. Bevin Bucks, OA 09/29/23 1:54 AM  Jeanice Millard, M.D., Ph.D. Diseases & Surgery of the Retina and Vitreous [facility]  I have reviewed the above documentation for accuracy and completeness, and I agree with the above. Jeanice Millard, M.D., Ph.D. 09/29/23 1:58 AM   Abbreviations: M myopia (nearsighted); A astigmatism; H hyperopia (farsighted); P presbyopia; Mrx spectacle prescription;  CTL contact lenses; OD right eye; OS left eye; OU both eyes  XT exotropia; ET esotropia; PEK punctate epithelial keratitis; PEE punctate epithelial erosions; DES dry eye syndrome; MGD meibomian gland dysfunction; ATs artificial tears; PFAT's preservative free artificial tears; NSC nuclear sclerotic cataract; PSC posterior subcapsular cataract; ERM epi-retinal membrane; PVD posterior vitreous detachment; RD  retinal detachment; DM diabetes mellitus; DR diabetic retinopathy; NPDR non-proliferative diabetic retinopathy; PDR proliferative diabetic retinopathy; CSME clinically significant macular edema; DME diabetic macular edema; dbh dot blot hemorrhages; CWS cotton wool spot; POAG primary open angle glaucoma; C/D cup-to-disc ratio; HVF humphrey visual field; GVF goldmann visual field; OCT optical coherence tomography; IOP intraocular pressure; BRVO Branch retinal vein occlusion; CRVO central retinal vein occlusion; CRAO central retinal artery occlusion; BRAO branch retinal artery occlusion; RT retinal tear; SB scleral buckle; PPV pars plana vitrectomy; VH Vitreous hemorrhage; PRP panretinal laser photocoagulation; IVK intravitreal kenalog; VMT vitreomacular traction; MH Macular hole;  NVD neovascularization of the disc; NVE neovascularization elsewhere; AREDS age related eye disease study; ARMD age related macular degeneration; POAG primary open angle glaucoma; EBMD epithelial/anterior basement membrane dystrophy; ACIOL anterior chamber intraocular lens; IOL intraocular lens; PCIOL posterior chamber intraocular lens; Phaco/IOL phacoemulsification with intraocular lens placement; PRK photorefractive keratectomy; LASIK laser assisted in situ keratomileusis; HTN hypertension; DM diabetes mellitus; COPD chronic obstructive pulmonary disease

## 2023-09-17 DIAGNOSIS — H04123 Dry eye syndrome of bilateral lacrimal glands: Secondary | ICD-10-CM | POA: Diagnosis not present

## 2023-09-17 DIAGNOSIS — H2511 Age-related nuclear cataract, right eye: Secondary | ICD-10-CM | POA: Diagnosis not present

## 2023-09-19 ENCOUNTER — Ambulatory Visit (INDEPENDENT_AMBULATORY_CARE_PROVIDER_SITE_OTHER): Admitting: Ophthalmology

## 2023-09-19 ENCOUNTER — Encounter (INDEPENDENT_AMBULATORY_CARE_PROVIDER_SITE_OTHER): Payer: Self-pay | Admitting: Ophthalmology

## 2023-09-19 DIAGNOSIS — H3552 Pigmentary retinal dystrophy: Secondary | ICD-10-CM | POA: Diagnosis not present

## 2023-09-19 DIAGNOSIS — E119 Type 2 diabetes mellitus without complications: Secondary | ICD-10-CM | POA: Diagnosis not present

## 2023-09-19 DIAGNOSIS — H353112 Nonexudative age-related macular degeneration, right eye, intermediate dry stage: Secondary | ICD-10-CM | POA: Diagnosis not present

## 2023-09-19 DIAGNOSIS — Z7985 Long-term (current) use of injectable non-insulin antidiabetic drugs: Secondary | ICD-10-CM

## 2023-09-19 DIAGNOSIS — H04123 Dry eye syndrome of bilateral lacrimal glands: Secondary | ICD-10-CM

## 2023-09-19 DIAGNOSIS — I10 Essential (primary) hypertension: Secondary | ICD-10-CM | POA: Diagnosis not present

## 2023-09-19 DIAGNOSIS — H35033 Hypertensive retinopathy, bilateral: Secondary | ICD-10-CM | POA: Diagnosis not present

## 2023-09-19 DIAGNOSIS — H353221 Exudative age-related macular degeneration, left eye, with active choroidal neovascularization: Secondary | ICD-10-CM | POA: Diagnosis not present

## 2023-09-19 DIAGNOSIS — Z7984 Long term (current) use of oral hypoglycemic drugs: Secondary | ICD-10-CM | POA: Diagnosis not present

## 2023-09-19 DIAGNOSIS — H25811 Combined forms of age-related cataract, right eye: Secondary | ICD-10-CM

## 2023-09-19 DIAGNOSIS — Z961 Presence of intraocular lens: Secondary | ICD-10-CM

## 2023-09-19 MED ORDER — FARICIMAB-SVOA 6 MG/0.05ML IZ SOLN
6.0000 mg | INTRAVITREAL | Status: AC | PRN
  Administered 2023-09-19: 6 mg via INTRAVITREAL

## 2023-10-03 NOTE — Progress Notes (Signed)
 [facility] - Clinic Note  10/17/2023    CHIEF COMPLAINT Patient presents for Retina Follow Up  HISTORY OF PRESENT ILLNESS: Eric Key is a 68 y.o. male who presents to the clinic today for:   HPI     Retina Follow Up   Patient presents with  Wet AMD.  In left eye.  This started 4 weeks ago.  I, the attending physician,  performed the HPI with the patient and updated documentation appropriately.        Comments   Patient here for 4 weeks retina follow up for exu ARMD OS. Patient states vision doing fine. No eye pain.       Last edited by [name], MD on 10/17/2023  2:43 PM.    Pt states vision is stable   Referring physician: [name], Fonda LABOR, MD [address] [place],  [place] [address]  HISTORICAL INFORMATION:   Selected notes from the MEDICAL RECORD NUMBER Referred by Dr. Deanne Mate for concern of mac heme OS.   CURRENT MEDICATIONS: No current outpatient medications on file. (Ophthalmic Drugs)   No current facility-administered medications for this visit. (Ophthalmic Drugs)   Current Outpatient Medications (Other)  Medication Sig   Continuous Glucose Sensor (FREESTYLE LIBRE 3 PLUS SENSOR) MISC 1 sensor change every 15 days.   Semaglutide ,0.25 or 0.5MG /DOS, (OZEMPIC , 0.25 OR 0.5 MG/DOSE,) 2 MG/3ML SOPN Inject 0.25 mg into the skin once a week.   No current facility-administered medications for this visit. (Other)   REVIEW OF SYSTEMS: ROS   Positive for: Endocrine, Eyes Negative for: Constitutional, Gastrointestinal, Neurological, Skin, Genitourinary, Musculoskeletal, HENT, Cardiovascular, Respiratory, Psychiatric, Allergic/Imm, Heme/Lymph Last edited by [name], COA on 10/17/2023  7:42 AM.     ALLERGIES Allergies  Allergen Reactions   Penicillins Hives   PAST MEDICAL HISTORY Past Medical History:  Diagnosis Date   Allergy    Penicillin   Cataract    Mixed form OU   History of adenomatous polyps of colon  03/16/2020   Hypertension    past hx    Hypertensive retinopathy    OU   Macular degeneration    Dry OD; Wet OS   Past Surgical History:  Procedure Laterality Date   ACHILLES TENDON REPAIR  2010   CHOLECYSTECTOMY     COLONOSCOPY     EYE SURGERY     GALLBLADDER SURGERY     FAMILY HISTORY Family History  Problem Relation Age of Onset   Diabetes Brother    Heart disease Brother    Diabetes Maternal Grandfather    Colon polyps Neg Hx    Colon cancer Neg Hx    Esophageal cancer Neg Hx    Rectal cancer Neg Hx    Stomach cancer Neg Hx    SOCIAL HISTORY Social History   Tobacco Use   Smoking status: Some Days    Current packs/day: 0.75    Average packs/day: 0.8 packs/day for 41.0 years (30.8 ttl pk-yrs)    Types: Cigars, Cigarettes   Smokeless tobacco: Never  Vaping Use   Vaping status: Never Used  Substance Use Topics   Alcohol use: Not Currently    Comment: occ   Drug use: Not Currently       OPHTHALMIC EXAM: Base Eye Exam     Visual Acuity (Snellen - Linear)       Right Left   Dist cc 20/20 -2 20/30    Correction: Glasses  Tonometry (Tonopen, 7:41 AM)       Right Left   Pressure 15 13         Pupils       Dark Light Shape React APD   Right 3 2 Round Brisk None   Left 3 2 Round Brisk None         Visual Fields (Counting fingers)       Left Right    Full Full         Extraocular Movement       Right Left    Full, Ortho Full, Ortho         Neuro/Psych     Oriented x3: Yes   Mood/Affect: Normal         Dilation     Both eyes: 1.0% Mydriacyl, 2.5% Phenylephrine @ 7:40 AM           Slit Lamp and Fundus Exam     Slit Lamp Exam       Right Left   Lids/Lashes Dermatochalasis - upper lid Dermatochalasis - upper lid   Conjunctiva/Sclera White and quiet White and quiet   Cornea trace PEE, mild Arcus, tear film debris mild Arcus, 2+ fine punctate epithelial erosions inferiorly, tear film debris -- improved, well  healed cataract wound   Anterior Chamber deep, clear, narrow angles deep, 0.5+cell/pigment   Iris Round and dilated, mild anterior bowing Round and moderately dilated, mild anterior bowing   Lens 2-3+ Nuclear sclerosis, 2-3+ Cortical cataract PC IOL in good position   Anterior Vitreous Vitreous syneresis Vitreous syneresis, fine cell/pigment         Fundus Exam       Right Left   Disc Pink and Sharp, focal PPP ST, Compact Pink and Sharp, Compact   C/D Ratio 0.2 0.4   Macula Flat, Drusen, RPE mottling and clumping, early Atrophy, punctate IRH -- fading abnormal foveal reflex, +CNV with heme stably improved, +central edema / cystic changes -- slightly improved, Drusen, RPE mottling and clumping, early subretinal fibrosis, no heme   Vessels attenuated, tortuous, perivascular pigment clumping peripherally, mild AV crossing changes attenuated, mild tortuousity, perivascular pigment clumping peripherally   Periphery Attached, peripheral pigmented CR scarring / atrophy and perivascular bone spicules 360, No heme Attached, peripheral pigmented CR scarring / atrophy and perivascular bone spicules 360, No heme           Refraction     Wearing Rx       Sphere Cylinder Axis Add   Right +0.25 +1.50 003 +2.00   Left +0.25 +0.75 169 +2.00           IMAGING AND PROCEDURES  Imaging and Procedures for @TODAY @  OCT, Retina - OU - Both Eyes       Right Eye Quality was good. Central Foveal Thickness: 312. Progression has been stable. Findings include normal foveal contour, no IRF, no SRF, retinal drusen , pigment epithelial detachment, outer retinal atrophy (Persistent vitreous opacities; stable low lying PEDs and ORA, partial PVD).   Left Eye Quality was borderline. Central Foveal Thickness: 382. Progression has improved. Findings include abnormal foveal contour, retinal drusen , subretinal hyper-reflective material, intraretinal fluid, pigment epithelial detachment, outer retinal atrophy  (persistent IRF overlying stable low-lying PED / SRHM temporal fovea and macula -- slightly improved).   Notes *Images captured and stored on drive  Diagnosis / Impression:  OD: non-exu ARMD w/ stable low lying PEDs and ORA OS: exu ARMD -- persistent IRF overlying stable  low-lying PED / [facility] temporal fovea and macula -- slightly improved  Clinical management:  See below  Abbreviations: NFP - Normal foveal profile. CME - cystoid macular edema. PED - pigment epithelial detachment. IRF - intraretinal fluid. SRF - subretinal fluid. EZ - ellipsoid zone. ERM - epiretinal membrane. ORA - outer retinal atrophy. ORT - outer retinal tubulation. SRHM - subretinal hyper-reflective material      Intravitreal Injection, Pharmacologic Agent - OS - Left Eye       Time Out 10/17/2023. 8:39 AM. Confirmed correct patient, procedure, site, and patient consented.   Anesthesia Topical anesthesia was used. Anesthetic medications included Lidocaine  2%, Proparacaine 0.5%.   Procedure Preparation included 5% betadine to ocular surface, eyelid speculum. A (32g) needle was used.   Injection: 6 mg faricimab -svoa 6 MG/0.05ML (Patient supplied)   Route: Intravitreal, Site: Left Eye   NDC: 49757-903-98, Lot: [id number], Expiration date: 10/13/2025, Waste: 0 mL   Post-op Post injection exam found visual acuity of at least counting fingers. The patient tolerated the procedure well. There were no complications. The patient received written and verbal post procedure care education. Post injection medications were not given.   Notes **SAMPLE MEDICATION ADMINISTERED**           ASSESSMENT/PLAN:   ICD-10-CM   1. Exudative age-related macular degeneration of left eye with active choroidal neovascularization (HCC)  H35.3221 OCT, Retina - OU - Both Eyes    Intravitreal Injection, Pharmacologic Agent - OS - Left Eye    faricimab -svoa (VABYSMO ) 6mg /0.52mL intravitreal injection    2. 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 Diabetes mellitus type 2 without retinopathy (HCC)  E11.9     5. Long term (current) use of oral hypoglycemic drugs  Z79.84     6. Long-term (current) use of injectable non-insulin antidiabetic drugs  Z79.85     7. Essential hypertension  I10     8. Hypertensive retinopathy of both eyes  H35.033     9. Combined forms of age-related cataract of right eye  H25.811     10. Pseudophakia  Z96.1     11. Dry eyes  H04.123      1. 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, OS  - s/p IVA OS #1 (03.04.21), #2 (03.04.21), #3 (04.30.21), #4 (01.07.25)  - s/p IVE OS #1 (06.04.21), #2 (07.08.21), #3 (08.09.21), #4 (09.13.21), #5 (10.18.21), #6 (11.24.21), #7 (12.31.21), #8 (02.11.22), #9 (03.25.22), #10 (05.02.22), #11 (06.06.22), #12 (07.13.22), #13 (08.17.22), #14 (09.21.22), #15 (10.26.22), #16 (11.29.22), #17 (01.03.23), #18 (02.07.23), #19 (03.16.23), #20 (04.20.23), #21 (05.19.23), #22 (06.20.23) -- IVE resistance  - s/p IVV OS #1 (07.18.23), #2 (08.15.23), #3 (09.12.23), #4 (10.10.23), #5 (11.14.23), #6 (12.21.23), #7 (01.25.24), #8 (02.29.24), #9 (04.11.24), #10 (05.20.24), #11 (07.01.24), #12 (08.13.24), #13 (10.01.24), #14 (11.26.24), 15 (sample 02.04.25), #16 (sample 03.11.25), #17 (04.08.25), #18 (sample, 05.08.25), #19 (sample, 06.05.25)  - exam shows central CNV with +subretinal heme -- improved  - BCVA OS 20/30 - stable  - OCT shows persistent IRF overlying stable low-lying PED / SRHM temporal fovea and macula -- slightly improved at 4 weeks   - recommend IVV OS #20 [sample] today 07.03.25 with follow up in 4 weeks -- will switch to IVE HD if no improvement in edema at next visit  - pt in agreement and wishes to proceed with injection  - RBA of procedure discussed, questions answered - see procedure  note  - IVV informed consent obtained and signed, 10.01.24  - cont PF  and Prolensa QID OS for possible CME/post-op inflammatory component   - Vabysmo  and Eylea  HD approved, but pt owes 20% due to [employer] funding being unavailable  - f/u 4 weeks -- DFE/OCT, possible injection  2. Age related macular degeneration, non-exudative, OD  - The incidence, anatomy, and pathology of dry AMD, risk of progression, and the AREDS and AREDS 2 study including smoking risks discussed with patient.   - recommend Amsler grid monitoring  3. Pigmentary retinopathy OU  - peripheral pigmented CR scarring and perivascular pigment clumping / bone spicules OU  - VA remains fairly good (20/20 OD, 20/40 OS with exudative ARMD OS)  - denies night blindness  - denies family history of vision problems  - discussed findings  - broad differential for pigmentary retinopathy:  Congenital (RP), infectious (TB, lyme dx, syphilis, toxo, bartonella), autoimmune and toxic etiologies  - labs checked (4.2.21):  All essential WNL   Rheumatoid factor   CBC   Comprehensive metabolic panel     RPR   ANA   ESR, CRP   ACE   ANCA   Toxoplasma antibodies IgG / IgM   Quantiferon - TB Gold Plus   HLA, A, B, C (IR)   Fluorescent treponemal ab (fla) - IgG - bld   VDRL Serum   Bartonella antibody, 06.04.21  - [facility] -- drawn on 6.4.21                         Results Positive for AGBL5, ALMS1, OPA1 -- unknown significance              - monitor  4-6. Diabetes mellitus, type 2 without retinopathy  - dx in October 2024 -- A1c 11.9 on 10.16.24  - on Ozempic  and metformin  - The incidence, risk factors for progression, natural history and treatment options for diabetic retinopathy  were discussed with patient.   - The need for close monitoring of blood glucose, blood pressure, and serum lipids, avoiding cigarette or any type of tobacco, and the need for long term follow up was also discussed with patient. - f/u in 1 year, sooner prn  7,8. Hypertensive retinopathy OU  - discussed  importance of tight BP control  - continue to monitor  9. Mixed form age related cataracts OD  - The symptoms of cataract, surgical options, and treatments and risks were discussed with patient.  - discussed diagnosis and progression  - under the expert management of Dr. Waylan  10. Pseudophakia OS  - s/p CE/IOL OS (Dr. Waylan, 10.23.24)  - IOL in good position  - OCT shows mild cystic changes - ?CME / post-op inflammatory component  - cont PF and Prolensa QID OS as above  - monitor  11. Dry eyes OU - recommend artificial tears and lubricating ointment as needed  Ophthalmic Meds Ordered this visit:  Meds ordered this encounter  Medications   faricimab -svoa (VABYSMO ) 6mg /0.52mL intravitreal injection     Return in about 4 weeks (around 11/14/2023) for f/u exu ARMD OS, DFE, OCT, Possible Injxn.  There are no Patient Instructions on file for this visit.  This document serves as a record of services personally performed by Redell JUDITHANN Hans, MD, PhD. It was created on their behalf by Alan PARAS. Delores, OA an ophthalmic technician. The creation of this record is the provider's dictation and/or activities during the visit.  Electronically signed by: Alan PARAS. Delores, OA 10/20/23 12:13 PM  Redell JUDITHANN Hans, M.D., Ph.D. Diseases & Surgery of the Retina and Vitreous [facility]  I have reviewed the above documentation for accuracy and completeness, and I agree with the above. Redell JUDITHANN Hans, M.D., Ph.D. 10/20/23 12:14 PM   Abbreviations: M myopia (nearsighted); A astigmatism; H hyperopia (farsighted); P presbyopia; Mrx spectacle prescription;  CTL contact lenses; OD right eye; OS left eye; OU both eyes  XT exotropia; ET esotropia; PEK punctate epithelial keratitis; PEE punctate epithelial erosions; DES dry eye syndrome; MGD meibomian gland dysfunction; ATs artificial tears; PFAT's preservative free artificial tears; NSC nuclear sclerotic cataract; PSC posterior  subcapsular cataract; ERM epi-retinal membrane; PVD posterior vitreous detachment; RD retinal detachment; DM diabetes mellitus; DR diabetic retinopathy; NPDR non-proliferative diabetic retinopathy; PDR proliferative diabetic retinopathy; CSME clinically significant macular edema; DME diabetic macular edema; dbh dot blot hemorrhages; CWS cotton wool spot; POAG primary open angle glaucoma; C/D cup-to-disc ratio; HVF humphrey visual field; GVF goldmann visual field; OCT optical coherence tomography; IOP intraocular pressure; BRVO Branch retinal vein occlusion; CRVO central retinal vein occlusion; CRAO central retinal artery occlusion; BRAO branch retinal artery occlusion; RT retinal tear; SB scleral buckle; PPV pars plana vitrectomy; VH Vitreous hemorrhage; PRP panretinal laser photocoagulation; IVK intravitreal kenalog; VMT vitreomacular traction; MH Macular hole;  NVD neovascularization of the disc; NVE neovascularization elsewhere; AREDS age related eye disease study; ARMD age related macular degeneration; POAG primary open angle glaucoma; EBMD epithelial/anterior basement membrane dystrophy; ACIOL anterior chamber intraocular lens; IOL intraocular lens; PCIOL posterior chamber intraocular lens; Phaco/IOL phacoemulsification with intraocular lens placement; PRK photorefractive keratectomy; LASIK laser assisted in situ keratomileusis; HTN hypertension; DM diabetes mellitus; COPD chronic obstructive pulmonary disease

## 2023-10-17 ENCOUNTER — Encounter (INDEPENDENT_AMBULATORY_CARE_PROVIDER_SITE_OTHER): Payer: Self-pay | Admitting: Ophthalmology

## 2023-10-17 ENCOUNTER — Ambulatory Visit (INDEPENDENT_AMBULATORY_CARE_PROVIDER_SITE_OTHER): Admitting: Ophthalmology

## 2023-10-17 DIAGNOSIS — Z7984 Long term (current) use of oral hypoglycemic drugs: Secondary | ICD-10-CM

## 2023-10-17 DIAGNOSIS — H35033 Hypertensive retinopathy, bilateral: Secondary | ICD-10-CM

## 2023-10-17 DIAGNOSIS — H3552 Pigmentary retinal dystrophy: Secondary | ICD-10-CM | POA: Diagnosis not present

## 2023-10-17 DIAGNOSIS — H04123 Dry eye syndrome of bilateral lacrimal glands: Secondary | ICD-10-CM

## 2023-10-17 DIAGNOSIS — H353112 Nonexudative age-related macular degeneration, right eye, intermediate dry stage: Secondary | ICD-10-CM | POA: Diagnosis not present

## 2023-10-17 DIAGNOSIS — I10 Essential (primary) hypertension: Secondary | ICD-10-CM | POA: Diagnosis not present

## 2023-10-17 DIAGNOSIS — Z7985 Long-term (current) use of injectable non-insulin antidiabetic drugs: Secondary | ICD-10-CM | POA: Diagnosis not present

## 2023-10-17 DIAGNOSIS — H25811 Combined forms of age-related cataract, right eye: Secondary | ICD-10-CM | POA: Diagnosis not present

## 2023-10-17 DIAGNOSIS — H353221 Exudative age-related macular degeneration, left eye, with active choroidal neovascularization: Secondary | ICD-10-CM

## 2023-10-17 DIAGNOSIS — E119 Type 2 diabetes mellitus without complications: Secondary | ICD-10-CM

## 2023-10-17 DIAGNOSIS — Z961 Presence of intraocular lens: Secondary | ICD-10-CM

## 2023-10-17 MED ORDER — FARICIMAB-SVOA 6 MG/0.05ML IZ SOLN
6.0000 mg | INTRAVITREAL | Status: AC | PRN
  Administered 2023-10-17: 6 mg via INTRAVITREAL

## 2023-11-12 NOTE — Progress Notes (Signed)
 [facility] - Clinic Note  11/14/2023    CHIEF COMPLAINT Patient presents for Retina Follow Up  HISTORY OF PRESENT ILLNESS: Eric Key is a 68 y.o. male who presents to the clinic today for:   HPI     Retina Follow Up   Patient presents with  Wet AMD.  In left eye.  This started 4.5 years ago.  Severity is moderate.  Duration of 4 weeks.  Since onset it is stable.  I, the attending physician,  performed the HPI with the patient and updated documentation appropriately.        Comments   Pt denies changes in vision/FOL/floaters/pain. Pt does not use ats. BS=125 this am A1c=7.1 3 months ago      Last edited by [name], MD on 11/14/2023 11:50 PM.     Pt states vision is stable, he states his eyes are dry, he has not been using drops   Referring physician: [name], Fonda LABOR, MD [address] [place],  [place] [address]  HISTORICAL INFORMATION:   Selected notes from the MEDICAL RECORD NUMBER Referred by Dr. Deanne Mate for concern of mac heme OS.   CURRENT MEDICATIONS: No current outpatient medications on file. (Ophthalmic Drugs)   No current facility-administered medications for this visit. (Ophthalmic Drugs)   Current Outpatient Medications (Other)  Medication Sig   Continuous Glucose Sensor (FREESTYLE LIBRE 3 PLUS SENSOR) MISC 1 sensor change every 15 days.   Semaglutide ,0.25 or 0.5MG /DOS, (OZEMPIC , 0.25 OR 0.5 MG/DOSE,) 2 MG/3ML SOPN Inject 0.25 mg into the skin once a week.   No current facility-administered medications for this visit. (Other)   REVIEW OF SYSTEMS: ROS   Positive for: Endocrine, Eyes Negative for: Constitutional, Gastrointestinal, Neurological, Skin, Genitourinary, Musculoskeletal, HENT, Cardiovascular, Respiratory, Psychiatric, Allergic/Imm, Heme/Lymph Last edited by Elnor Avelina RAMAN, COT on 11/14/2023  7:49 AM.      ALLERGIES Allergies  Allergen Reactions   Penicillins Hives   PAST MEDICAL HISTORY Past  Medical History:  Diagnosis Date   Allergy    Penicillin   Cataract    Mixed form OU   History of adenomatous polyps of colon 03/16/2020   Hypertension    past hx    Hypertensive retinopathy    OU   Macular degeneration    Dry OD; Wet OS   Past Surgical History:  Procedure Laterality Date   ACHILLES TENDON REPAIR  2010   CHOLECYSTECTOMY     COLONOSCOPY     EYE SURGERY     GALLBLADDER SURGERY     FAMILY HISTORY Family History  Problem Relation Age of Onset   Diabetes Brother    Heart disease Brother    Diabetes Maternal Grandfather    Colon polyps Neg Hx    Colon cancer Neg Hx    Esophageal cancer Neg Hx    Rectal cancer Neg Hx    Stomach cancer Neg Hx    SOCIAL HISTORY Social History   Tobacco Use   Smoking status: Some Days    Current packs/day: 0.75    Average packs/day: 0.8 packs/day for 41.0 years (30.8 ttl pk-yrs)    Types: Cigars, Cigarettes   Smokeless tobacco: Never  Vaping Use   Vaping status: Never Used  Substance Use Topics   Alcohol use: Not Currently    Comment: occ   Drug use: Not Currently       OPHTHALMIC EXAM: Base Eye Exam     Visual Acuity (Snellen - Linear)  Right Left   Dist cc 20/20 -2 20/25 -2   Dist ph cc  NI    Correction: Glasses         Tonometry (Tonopen, 7:44 AM)       Right Left   Pressure 15 12         Pupils       Pupils Dark Light Shape React APD   Right PERRL 3 2 Round Brisk None   Left PERRL 3 2 Round Brisk None         Visual Fields       Left Right    Full Full         Extraocular Movement       Right Left    Full, Ortho Full, Ortho         Neuro/Psych     Oriented x3: Yes   Mood/Affect: Normal         Dilation     Both eyes: 1.0% Mydriacyl, 2.5% Phenylephrine @ 7:44 AM           Slit Lamp and Fundus Exam     Slit Lamp Exam       Right Left   Lids/Lashes Dermatochalasis - upper lid Dermatochalasis - upper lid   Conjunctiva/Sclera White and quiet White and  quiet   Cornea trace PEE, mild Arcus, tear film debris mild Arcus, 2-3+ fine punctate epithelial erosions inferiorly, tear film debris, well healed cataract wound   Anterior Chamber deep, clear, narrow angles deep, 0.5+cell/pigment   Iris Round and dilated, mild anterior bowing Round and moderately dilated, mild anterior bowing   Lens 2-3+ Nuclear sclerosis, 2-3+ Cortical cataract PC IOL in good position   Anterior Vitreous Vitreous syneresis Vitreous syneresis, fine cell/pigment         Fundus Exam       Right Left   Disc Pink and Sharp, focal PPP ST, Compact Pink and Sharp, Compact   C/D Ratio 0.2 0.4   Macula Flat, Drusen, RPE mottling and clumping, early Atrophy, punctate IRH -- fading abnormal foveal reflex, +CNV with heme stably improved, +central edema / cystic changes -- slightly improved, Drusen, RPE mottling and clumping, early subretinal fibrosis, no heme   Vessels attenuated, tortuous, perivascular pigment clumping peripherally, mild AV crossing changes attenuated, mild tortuousity, perivascular pigment clumping peripherally   Periphery Attached, peripheral pigmented CR scarring / atrophy and perivascular bone spicules 360, No heme Attached, peripheral pigmented CR scarring / atrophy and perivascular bone spicules 360, No heme           Refraction     Wearing Rx       Sphere Cylinder Axis Add   Right +0.25 +1.50 003 +2.00   Left +0.25 +0.75 169 +2.00           IMAGING AND PROCEDURES  Imaging and Procedures for @TODAY @  OCT, Retina - OU - Both Eyes       Right Eye Quality was borderline. Central Foveal Thickness: 313. Progression has been stable. Findings include normal foveal contour, no IRF, no SRF, retinal drusen , pigment epithelial detachment, outer retinal atrophy (Persistent vitreous opacities; stable low lying PEDs and ORA, partial PVD).   Left Eye Quality was borderline. Central Foveal Thickness: 37. Progression has improved. Findings include  abnormal foveal contour, retinal drusen , subretinal hyper-reflective material, intraretinal fluid, pigment epithelial detachment, outer retinal atrophy (persistent IRF overlying stable low-lying PED / SRHM temporal fovea and macula -- slightly improved).   Notes *  Images captured and stored on drive  Diagnosis / Impression:  OD: non-exu ARMD w/ stable low lying PEDs and ORA OS: exu ARMD -- persistent IRF overlying stable low-lying PED / SRHM temporal fovea and macula -- slightly improved  Clinical management:  See below  Abbreviations: NFP - Normal foveal profile. CME - cystoid macular edema. PED - pigment epithelial detachment. IRF - intraretinal fluid. SRF - subretinal fluid. EZ - ellipsoid zone. ERM - epiretinal membrane. ORA - outer retinal atrophy. ORT - outer retinal tubulation. SRHM - subretinal hyper-reflective material      Intravitreal Injection, Pharmacologic Agent - OS - Left Eye       Time Out 11/14/2023. 8:18 AM. Confirmed correct patient, procedure, site, and patient consented.   Anesthesia Topical anesthesia was used. Anesthetic medications included Lidocaine  2%, Proparacaine 0.5%.   Procedure Preparation included 5% betadine to ocular surface, eyelid speculum. A (32g) needle was used.   Injection: 6 mg faricimab -svoa 6 MG/0.05ML (Patient supplied)   Route: Intravitreal, Site: Left Eye   NDC: 49757-903-98, Lot: [id number], Expiration date: 01/13/2026, Waste: 0 mL   Post-op Post injection exam found visual acuity of at least counting fingers. The patient tolerated the procedure well. There were no complications. The patient received written and verbal post procedure care education. Post injection medications were not given.   Notes **SAMPLE MEDICATION ADMINISTERED**           ASSESSMENT/PLAN:   ICD-10-CM   1. Exudative age-related macular degeneration of left eye with active choroidal neovascularization (HCC)  H35.3221 OCT, Retina - OU - Both Eyes     Intravitreal Injection, Pharmacologic Agent - OS - Left Eye    faricimab -svoa (VABYSMO ) 6mg /0.20mL intravitreal injection    2. 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 Diabetes mellitus type 2 without retinopathy (HCC)  E11.9     5. Long term (current) use of oral hypoglycemic drugs  Z79.84     6. Long-term (current) use of injectable non-insulin antidiabetic drugs  Z79.85     7. Essential hypertension  I10     8. Hypertensive retinopathy of both eyes  H35.033     9. Combined forms of age-related cataract of right eye  H25.811     10. Pseudophakia  Z96.1     11. Dry eyes  H04.123      1. 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, OS  - s/p IVA OS #1 (03.04.21), #2 (03.04.21), #3 (04.30.21), #4 (01.07.25)  - s/p IVE OS #1 (06.04.21), #2 (07.08.21), #3 (08.09.21), #4 (09.13.21), #5 (10.18.21), #6 (11.24.21), #7 (12.31.21), #8 (02.11.22), #9 (03.25.22), #10 (05.02.22), #11 (06.06.22), #12 (07.13.22), #13 (08.17.22), #14 (09.21.22), #15 (10.26.22), #16 (11.29.22), #17 (01.03.23), #18 (02.07.23), #19 (03.16.23), #20 (04.20.23), #21 (05.19.23), #22 (06.20.23) -- IVE resistance  - s/p IVV OS #1 (07.18.23), #2 (08.15.23), #3 (09.12.23), #4 (10.10.23), #5 (11.14.23), #6 (12.21.23), #7 (01.25.24), #8 (02.29.24), #9 (04.11.24), #10 (05.20.24), #11 (07.01.24), #12 (08.13.24), #13 (10.01.24), #14 (11.26.24), 15 (sample 02.04.25), #16 (sample 03.11.25), #17 (04.08.25), #18 (sample, 05.08.25), #19 (sample, 06.05.25), #20 (sample, 07.03.25)  - exam shows central CNV with +subretinal heme -- improved  - BCVA OS 20/25 from 20/30  - OCT shows persistent IRF overlying stable low-lying PED / [facility] temporal fovea and macula -- slightly improved at 4 weeks   - recommend IVV OS #21 [sample] today 07.31.25 with follow up in 4 weeks -- will switch to  IVE HD if no improvement in edema at next visit  - pt in agreement  and wishes to proceed with injection  - RBA of procedure discussed, questions answered - see procedure note  - IVV informed consent obtained and signed, 10.01.24  - cont PF and Prolensa QID OS for possible CME/post-op inflammatory component   - Vabysmo  and Eylea  HD approved, but pt owes 20% due to [employer] funding being unavailable  - f/u 4 weeks -- DFE/OCT, possible injection  2. Age related macular degeneration, non-exudative, OD  - The incidence, anatomy, and pathology of dry AMD, risk of progression, and the AREDS and AREDS 2 study including smoking risks discussed with patient.   - recommend Amsler grid monitoring  3. Pigmentary retinopathy OU  - peripheral pigmented CR scarring and perivascular pigment clumping / bone spicules OU  - VA remains fairly good (20/20 OD, 20/40 OS with exudative ARMD OS)  - denies night blindness  - denies family history of vision problems  - discussed findings  - broad differential for pigmentary retinopathy:  Congenital (RP), infectious (TB, lyme dx, syphilis, toxo, bartonella), autoimmune and toxic etiologies  - labs checked (4.2.21):  All essential WNL   Rheumatoid factor   CBC   Comprehensive metabolic panel     RPR   ANA   ESR, CRP   ACE   ANCA   Toxoplasma antibodies IgG / IgM   Quantiferon - TB Gold Plus   HLA, A, B, C (IR)   Fluorescent treponemal ab (fla) - IgG - bld   VDRL Serum   Bartonella antibody, 06.04.21  - [facility] -- drawn on 6.4.21                         Results Positive for AGBL5, ALMS1, OPA1 -- unknown significance              - monitor  4-6. Diabetes mellitus, type 2 without retinopathy  - dx in October 2024 -- A1c 11.9 on 10.16.24  - on Ozempic  and metformin  - The incidence, risk factors for progression, natural history and treatment options for diabetic retinopathy  were discussed with patient.   - The need for close monitoring of blood glucose, blood pressure, and serum lipids, avoiding cigarette or  any type of tobacco, and the need for long term follow up was also discussed with patient. - f/u in 1 year, sooner prn  7,8. Hypertensive retinopathy OU  - discussed importance of tight BP control  - continue to monitor  9. Mixed form age related cataracts OD  - The symptoms of cataract, surgical options, and treatments and risks were discussed with patient.  - discussed diagnosis and progression  - under the expert management of Dr. Waylan  10. Pseudophakia OS  - s/p CE/IOL OS (Dr. Waylan, 10.23.24)  - IOL in good position  - OCT shows mild cystic changes - ?CME / post-op inflammatory component  - cont PF and Prolensa QID OS as above  - monitor  11. Dry eyes OU - recommend artificial tears and lubricating ointment as needed  Ophthalmic Meds Ordered this visit:  Meds ordered this encounter  Medications   faricimab -svoa (VABYSMO ) 6mg /0.19mL intravitreal injection     Return in about 4 weeks (around 12/12/2023) for f/u exu ARMD OS, DFE, OCT.  There are no Patient Instructions on file for this visit.  This document serves as a record of services personally performed by Redell JUDITHANN Hans,  MD, PhD. It was created on their behalf by Alan PARAS. Delores, OA an ophthalmic technician. The creation of this record is the provider's dictation and/or activities during the visit.    Electronically signed by: Alan PARAS. Delores, OA 11/17/23 2:42 AM  Redell JUDITHANN Hans, M.D., Ph.D. Diseases & Surgery of the Retina and Vitreous [facility]  I have reviewed the above documentation for accuracy and completeness, and I agree with the above. Redell JUDITHANN Hans, M.D., Ph.D. 11/17/23 2:43 AM   Abbreviations: M myopia (nearsighted); A astigmatism; H hyperopia (farsighted); P presbyopia; Mrx spectacle prescription;  CTL contact lenses; OD right eye; OS left eye; OU both eyes  XT exotropia; ET esotropia; PEK punctate epithelial keratitis; PEE punctate epithelial erosions; DES dry eye syndrome;  MGD meibomian gland dysfunction; ATs artificial tears; PFAT's preservative free artificial tears; NSC nuclear sclerotic cataract; PSC posterior subcapsular cataract; ERM epi-retinal membrane; PVD posterior vitreous detachment; RD retinal detachment; DM diabetes mellitus; DR diabetic retinopathy; NPDR non-proliferative diabetic retinopathy; PDR proliferative diabetic retinopathy; CSME clinically significant macular edema; DME diabetic macular edema; dbh dot blot hemorrhages; CWS cotton wool spot; POAG primary open angle glaucoma; C/D cup-to-disc ratio; HVF humphrey visual field; GVF goldmann visual field; OCT optical coherence tomography; IOP intraocular pressure; BRVO Branch retinal vein occlusion; CRVO central retinal vein occlusion; CRAO central retinal artery occlusion; BRAO branch retinal artery occlusion; RT retinal tear; SB scleral buckle; PPV pars plana vitrectomy; VH Vitreous hemorrhage; PRP panretinal laser photocoagulation; IVK intravitreal kenalog; VMT vitreomacular traction; MH Macular hole;  NVD neovascularization of the disc; NVE neovascularization elsewhere; AREDS age related eye disease study; ARMD age related macular degeneration; POAG primary open angle glaucoma; EBMD epithelial/anterior basement membrane dystrophy; ACIOL anterior chamber intraocular lens; IOL intraocular lens; PCIOL posterior chamber intraocular lens; Phaco/IOL phacoemulsification with intraocular lens placement; PRK photorefractive keratectomy; LASIK laser assisted in situ keratomileusis; HTN hypertension; DM diabetes mellitus; COPD chronic obstructive pulmonary disease

## 2023-11-14 ENCOUNTER — Ambulatory Visit (INDEPENDENT_AMBULATORY_CARE_PROVIDER_SITE_OTHER): Admitting: Ophthalmology

## 2023-11-14 ENCOUNTER — Encounter (INDEPENDENT_AMBULATORY_CARE_PROVIDER_SITE_OTHER): Payer: Self-pay | Admitting: Ophthalmology

## 2023-11-14 DIAGNOSIS — H35033 Hypertensive retinopathy, bilateral: Secondary | ICD-10-CM

## 2023-11-14 DIAGNOSIS — E119 Type 2 diabetes mellitus without complications: Secondary | ICD-10-CM | POA: Diagnosis not present

## 2023-11-14 DIAGNOSIS — H353221 Exudative age-related macular degeneration, left eye, with active choroidal neovascularization: Secondary | ICD-10-CM | POA: Diagnosis not present

## 2023-11-14 DIAGNOSIS — Z7984 Long term (current) use of oral hypoglycemic drugs: Secondary | ICD-10-CM

## 2023-11-14 DIAGNOSIS — H353112 Nonexudative age-related macular degeneration, right eye, intermediate dry stage: Secondary | ICD-10-CM

## 2023-11-14 DIAGNOSIS — H3552 Pigmentary retinal dystrophy: Secondary | ICD-10-CM | POA: Diagnosis not present

## 2023-11-14 DIAGNOSIS — Z7985 Long-term (current) use of injectable non-insulin antidiabetic drugs: Secondary | ICD-10-CM | POA: Diagnosis not present

## 2023-11-14 DIAGNOSIS — H25811 Combined forms of age-related cataract, right eye: Secondary | ICD-10-CM | POA: Diagnosis not present

## 2023-11-14 DIAGNOSIS — H04123 Dry eye syndrome of bilateral lacrimal glands: Secondary | ICD-10-CM

## 2023-11-14 DIAGNOSIS — I10 Essential (primary) hypertension: Secondary | ICD-10-CM | POA: Diagnosis not present

## 2023-11-14 DIAGNOSIS — Z961 Presence of intraocular lens: Secondary | ICD-10-CM

## 2023-11-14 MED ORDER — FARICIMAB-SVOA 6 MG/0.05ML IZ SOLN
6.0000 mg | INTRAVITREAL | Status: AC | PRN
  Administered 2023-11-14: 6 mg via INTRAVITREAL

## 2023-11-15 ENCOUNTER — Encounter: Payer: Self-pay | Admitting: Family Medicine

## 2023-11-28 NOTE — Progress Notes (Shared)
 [facility] - Clinic Note  12/12/2023    CHIEF COMPLAINT Patient presents for No chief complaint on file.  HISTORY OF PRESENT ILLNESS: Eric Key is a 68 y.o. male who presents to the clinic today for:     Pt states vision is stable, he states his eyes are dry, he has not been using drops   Referring physician: [name], Eric LABOR, MD [id number] [place],  [place] [address]  HISTORICAL INFORMATION:   Selected notes from the MEDICAL RECORD NUMBER Referred by Dr. Deanne Mate for concern of mac heme OS.   CURRENT MEDICATIONS: No current outpatient medications on file. (Ophthalmic Drugs)   No current facility-administered medications for this visit. (Ophthalmic Drugs)   Current Outpatient Medications (Other)  Medication Sig   Continuous Glucose Sensor (FREESTYLE LIBRE 3 PLUS SENSOR) MISC 1 sensor change every 15 days.   Semaglutide ,0.25 or 0.5MG /DOS, (OZEMPIC , 0.25 OR 0.5 MG/DOSE,) 2 MG/3ML SOPN Inject 0.25 mg into the skin once a week.   No current facility-administered medications for this visit. (Other)   REVIEW OF SYSTEMS:    ALLERGIES Allergies  Allergen Reactions   Penicillins Hives   PAST MEDICAL HISTORY Past Medical History:  Diagnosis Date   Allergy    Penicillin   Cataract    Mixed form OU   History of adenomatous polyps of colon 03/16/2020   Hypertension    past hx    Hypertensive retinopathy    OU   Macular degeneration    Dry OD; Wet OS   Past Surgical History:  Procedure Laterality Date   ACHILLES TENDON REPAIR  2010   CHOLECYSTECTOMY     COLONOSCOPY     EYE SURGERY     GALLBLADDER SURGERY     FAMILY HISTORY Family History  Problem Relation Age of Onset   Diabetes Brother    Heart disease Brother    Diabetes Maternal Grandfather    Colon polyps Neg Hx    Colon cancer Neg Hx    Esophageal cancer Neg Hx    Rectal cancer Neg Hx    Stomach cancer Neg Hx    SOCIAL HISTORY Social History   Tobacco Use    Smoking status: Some Days    Current packs/day: 0.75    Average packs/day: 0.8 packs/day for 41.0 years (30.8 ttl pk-yrs)    Types: Cigars, Cigarettes   Smokeless tobacco: Never  Vaping Use   Vaping status: Never Used  Substance Use Topics   Alcohol use: Not Currently    Comment: occ   Drug use: Not Currently       OPHTHALMIC EXAM: Not recorded    IMAGING AND PROCEDURES  Imaging and Procedures for @TODAY @          ASSESSMENT/PLAN:   ICD-10-CM   1. Exudative age-related macular degeneration of left eye with active choroidal neovascularization (HCC)  H35.3221     2. 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 Diabetes mellitus type 2 without retinopathy (HCC)  E11.9     5. Long term (current) use of oral hypoglycemic drugs  Z79.84     6. Long-term (current) use of injectable non-insulin antidiabetic drugs  Z79.85     7. Essential hypertension  I10     8. Hypertensive retinopathy of both eyes  H35.033     9. Combined forms of age-related cataract of right eye  H25.811     10. Pseudophakia  Z96.1     11. Dry eyes  H04.123       1. 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, OS  - s/p IVA OS #1 (03.04.21), #2 (03.04.21), #3 (04.30.21), #4 (01.07.25)  - s/p IVE OS #1 (06.04.21), #2 (07.08.21), #3 (08.09.21), #4 (09.13.21), #5 (10.18.21), #6 (11.24.21), #7 (12.31.21), #8 (02.11.22), #9 (03.25.22), #10 (05.02.22), #11 (06.06.22), #12 (07.13.22), #13 (08.17.22), #14 (09.21.22), #15 (10.26.22), #16 (11.29.22), #17 (01.03.23), #18 (02.07.23), #19 (03.16.23), #20 (04.20.23), #21 (05.19.23), #22 (06.20.23) -- IVE resistance  - s/p IVV OS #1 (07.18.23), #2 (08.15.23), #3 (09.12.23), #4 (10.10.23), #5 (11.14.23), #6 (12.21.23), #7 (01.25.24), #8 (02.29.24), #9 (04.11.24), #10 (05.20.24), #11 (07.01.24), #12 (08.13.24), #13 (10.01.24), #14 (11.26.24), 15 (sample 02.04.25), #16 (sample  03.11.25), #17 (04.08.25), #18 (sample, 05.08.25), #19 (sample, 06.05.25), #20 (sample, 07.03.25), #21 (sample, 07.31.25)  - exam shows central CNV with +subretinal heme -- improved  - BCVA OS 20/25 from 20/30  - OCT shows persistent IRF overlying stable low-lying PED / [facility] temporal fovea and macula -- slightly improved at 4 weeks   - recommend IVV today OS [sample] #22 (08.20.25) with follow up in 4 weeks -- will switch to IVE HD if no improvement in edema at next visit  - pt in agreement and wishes to proceed with injection  - RBA of procedure discussed, questions answered - see procedure note  - IVV informed consent obtained and signed, 10.01.24  - cont PF and Prolensa QID OS for possible CME/post-op inflammatory component   - Vabysmo  and Eylea  HD approved, but pt owes 20% due to [employer] funding being unavailable  - f/u 4 weeks -- DFE/OCT, possible injection  2. Age related macular degeneration, non-exudative, OD  - The incidence, anatomy, and pathology of dry AMD, risk of progression, and the AREDS and AREDS 2 study including smoking risks discussed with patient.   - recommend Amsler grid monitoring  3. Pigmentary retinopathy OU  - peripheral pigmented CR scarring and perivascular pigment clumping / bone spicules OU  - VA remains fairly good (20/20 OD, 20/40 OS with exudative ARMD OS)  - denies night blindness  - denies family history of vision problems  - discussed findings  - broad differential for pigmentary retinopathy:  Congenital (RP), infectious (TB, lyme dx, syphilis, toxo, bartonella), autoimmune and toxic etiologies  - labs checked (4.2.21):  All essential WNL   Rheumatoid factor   CBC   Comprehensive metabolic panel     RPR   ANA   ESR, CRP   ACE   ANCA   Toxoplasma antibodies IgG / IgM   Quantiferon - TB Gold Plus   HLA, A, B, C (IR)   Fluorescent treponemal ab (fla) - IgG - bld   VDRL Serum   Bartonella antibody, 06.04.21  - [facility] -- drawn on  6.4.21                         Results Positive for AGBL5, ALMS1, OPA1 -- unknown significance              - monitor  4-6. Diabetes mellitus, type 2 without retinopathy  - dx in October 2024 -- A1c 11.9 on 10.16.24  - on Ozempic  and metformin  - The incidence, risk factors for progression, natural history and treatment options for diabetic retinopathy  were discussed with patient.   - The need for close monitoring of blood glucose, blood pressure,  and serum lipids, avoiding cigarette or any type of tobacco, and the need for long term follow up was also discussed with patient. - f/u in 1 year, sooner prn  7,8. Hypertensive retinopathy OU  - discussed importance of tight BP control  - continue to monitor  9. Mixed form age related cataracts OD  - The symptoms of cataract, surgical options, and treatments and risks were discussed with patient.  - discussed diagnosis and progression  - under the expert management of Dr. Waylan  10. Pseudophakia OS  - s/p CE/IOL OS (Dr. Waylan, 10.23.24)  - IOL in good position  - OCT shows mild cystic changes - ?CME / post-op inflammatory component  - cont PF and Prolensa QID OS as above  - monitor  11. Dry eyes OU - recommend artificial tears and lubricating ointment as needed  Ophthalmic Meds Ordered this visit:  No orders of the defined types were placed in this encounter.    No follow-ups on file.  There are no Patient Instructions on file for this visit.  This document serves as a record of services personally performed by Redell JUDITHANN Hans, MD, PhD. It was created on their behalf by Avelina Pereyra, COA an ophthalmic technician. The creation of this record is the provider's dictation and/or activities during the visit.   Electronically signed by: Avelina GORMAN Pereyra, COT  11/28/23  3:05 PM    Redell JUDITHANN Hans, M.D., Ph.D. Diseases & Surgery of the Retina and Vitreous [facility]    Abbreviations: M myopia (nearsighted);  A astigmatism; H hyperopia (farsighted); P presbyopia; Mrx spectacle prescription;  CTL contact lenses; OD right eye; OS left eye; OU both eyes  XT exotropia; ET esotropia; PEK punctate epithelial keratitis; PEE punctate epithelial erosions; DES dry eye syndrome; MGD meibomian gland dysfunction; ATs artificial tears; PFAT's preservative free artificial tears; NSC nuclear sclerotic cataract; PSC posterior subcapsular cataract; ERM epi-retinal membrane; PVD posterior vitreous detachment; RD retinal detachment; DM diabetes mellitus; DR diabetic retinopathy; NPDR non-proliferative diabetic retinopathy; PDR proliferative diabetic retinopathy; CSME clinically significant macular edema; DME diabetic macular edema; dbh dot blot hemorrhages; CWS cotton wool spot; POAG primary open angle glaucoma; C/D cup-to-disc ratio; HVF humphrey visual field; GVF goldmann visual field; OCT optical coherence tomography; IOP intraocular pressure; BRVO Branch retinal vein occlusion; CRVO central retinal vein occlusion; CRAO central retinal artery occlusion; BRAO branch retinal artery occlusion; RT retinal tear; SB scleral buckle; PPV pars plana vitrectomy; VH Vitreous hemorrhage; PRP panretinal laser photocoagulation; IVK intravitreal kenalog; VMT vitreomacular traction; MH Macular hole;  NVD neovascularization of the disc; NVE neovascularization elsewhere; AREDS age related eye disease study; ARMD age related macular degeneration; POAG primary open angle glaucoma; EBMD epithelial/anterior basement membrane dystrophy; ACIOL anterior chamber intraocular lens; IOL intraocular lens; PCIOL posterior chamber intraocular lens; Phaco/IOL phacoemulsification with intraocular lens placement; PRK photorefractive keratectomy; LASIK laser assisted in situ keratomileusis; HTN hypertension; DM diabetes mellitus; COPD chronic obstructive pulmonary disease

## 2023-12-12 ENCOUNTER — Encounter (INDEPENDENT_AMBULATORY_CARE_PROVIDER_SITE_OTHER): Admitting: Ophthalmology

## 2023-12-12 DIAGNOSIS — I10 Essential (primary) hypertension: Secondary | ICD-10-CM

## 2023-12-12 DIAGNOSIS — Z7984 Long term (current) use of oral hypoglycemic drugs: Secondary | ICD-10-CM

## 2023-12-12 DIAGNOSIS — H3552 Pigmentary retinal dystrophy: Secondary | ICD-10-CM

## 2023-12-12 DIAGNOSIS — H353221 Exudative age-related macular degeneration, left eye, with active choroidal neovascularization: Secondary | ICD-10-CM

## 2023-12-12 DIAGNOSIS — H35033 Hypertensive retinopathy, bilateral: Secondary | ICD-10-CM

## 2023-12-12 DIAGNOSIS — H25811 Combined forms of age-related cataract, right eye: Secondary | ICD-10-CM

## 2023-12-12 DIAGNOSIS — H04123 Dry eye syndrome of bilateral lacrimal glands: Secondary | ICD-10-CM

## 2023-12-12 DIAGNOSIS — H353112 Nonexudative age-related macular degeneration, right eye, intermediate dry stage: Secondary | ICD-10-CM

## 2023-12-12 DIAGNOSIS — Z7985 Long-term (current) use of injectable non-insulin antidiabetic drugs: Secondary | ICD-10-CM

## 2023-12-12 DIAGNOSIS — Z961 Presence of intraocular lens: Secondary | ICD-10-CM

## 2023-12-12 DIAGNOSIS — E119 Type 2 diabetes mellitus without complications: Secondary | ICD-10-CM

## 2023-12-12 NOTE — Progress Notes (Signed)
 [facility] - Clinic Note  12/19/2023    CHIEF COMPLAINT Patient presents for Retina Follow Up  HISTORY OF PRESENT ILLNESS: Eric Key is a 68 y.o. male who presents to the clinic today for:   HPI     Retina Follow Up   Patient presents with  Wet AMD.  In left eye.  This started 4 years ago.  Severity is moderate.  Duration of 4 weeks.  Since onset it is stable.  I, the attending physician,  performed the HPI with the patient and updated documentation appropriately.        Comments   Patient feels the vision is the same. He is not using eye drops.       Last edited by [name], MD on 12/19/2023 11:16 AM.     Pt states VA is stable, no change.    Referring physician: [name], Fonda LABOR, MD [address] [place],  [place] [address]  HISTORICAL INFORMATION:   Selected notes from the MEDICAL RECORD NUMBER Referred by Dr. Deanne Mate for concern of mac heme OS.   CURRENT MEDICATIONS: No current outpatient medications on file. (Ophthalmic Drugs)   No current facility-administered medications for this visit. (Ophthalmic Drugs)   Current Outpatient Medications (Other)  Medication Sig   Continuous Glucose Sensor (FREESTYLE LIBRE 3 PLUS SENSOR) MISC 1 sensor change every 15 days.   Semaglutide ,0.25 or 0.5MG /DOS, (OZEMPIC , 0.25 OR 0.5 MG/DOSE,) 2 MG/3ML SOPN Inject 0.25 mg into the skin once a week.   No current facility-administered medications for this visit. (Other)   REVIEW OF SYSTEMS:    ALLERGIES Allergies  Allergen Reactions   Penicillins Hives   PAST MEDICAL HISTORY Past Medical History:  Diagnosis Date   Allergy    Penicillin   Cataract    Mixed form OU   History of adenomatous polyps of colon 03/16/2020   Hypertension    past hx    Hypertensive retinopathy    OU   Macular degeneration    Dry OD; Wet OS   Past Surgical History:  Procedure Laterality Date   ACHILLES TENDON REPAIR  2010   CHOLECYSTECTOMY      COLONOSCOPY     EYE SURGERY     GALLBLADDER SURGERY     FAMILY HISTORY Family History  Problem Relation Age of Onset   Diabetes Brother    Heart disease Brother    Diabetes Maternal Grandfather    Colon polyps Neg Hx    Colon cancer Neg Hx    Esophageal cancer Neg Hx    Rectal cancer Neg Hx    Stomach cancer Neg Hx    SOCIAL HISTORY Social History   Tobacco Use   Smoking status: Some Days    Current packs/day: 0.75    Average packs/day: 0.8 packs/day for 41.0 years (30.8 ttl pk-yrs)    Types: Cigars, Cigarettes   Smokeless tobacco: Never  Vaping Use   Vaping status: Never Used  Substance Use Topics   Alcohol use: Not Currently    Comment: occ   Drug use: Not Currently       OPHTHALMIC EXAM: Base Eye Exam     Visual Acuity (Snellen - Linear)       Right Left   Dist cc 20/20 +2 20/25   Dist ph cc  NI    Correction: Glasses         Tonometry (Tonopen, 7:38 AM)       Right Left  Pressure 16 14         Pupils       Dark Light Shape React APD   Right 3 2 Round Brisk None   Left 3 2 Round Brisk None         Visual Fields       Left Right    Full Full         Extraocular Movement       Right Left    Full, Ortho Full, Ortho         Neuro/Psych     Oriented x3: Yes   Mood/Affect: Normal         Dilation     Both eyes: 1.0% Mydriacyl, 2.5% Phenylephrine @ 7:36 AM           Slit Lamp and Fundus Exam     Slit Lamp Exam       Right Left   Lids/Lashes Dermatochalasis - upper lid Dermatochalasis - upper lid   Conjunctiva/Sclera White and quiet White and quiet   Cornea trace PEE, mild Arcus, tear film debris mild Arcus, 2-3+ fine punctate epithelial erosions inferiorly, tear film debris, well healed cataract wound   Anterior Chamber deep, clear, narrow angles deep, 0.5+cell/pigment   Iris Round and dilated, mild anterior bowing Round and moderately dilated, mild anterior bowing   Lens 2-3+ Nuclear sclerosis, 2-3+ Cortical  cataract PC IOL in good position   Anterior Vitreous Vitreous syneresis Vitreous syneresis, fine cell/pigment         Fundus Exam       Right Left   Disc Pink and Sharp, focal PPP ST, Compact Pink and Sharp, Compact   C/D Ratio 0.2 0.4   Macula Flat, Drusen, RPE mottling and clumping, early Atrophy, punctate IRH -- fading abnormal foveal reflex, +CNV with heme stably improved, +central edema / cystic changes -- slightly increased, Drusen, RPE mottling and clumping, mild subretinal fibrosis, no heme   Vessels attenuated, tortuous, perivascular pigment clumping peripherally, mild AV crossing changes attenuated, mild tortuousity, perivascular pigment clumping peripherally   Periphery Attached, peripheral pigmented CR scarring / atrophy and perivascular bone spicules 360, No heme Attached, peripheral pigmented CR scarring / atrophy and perivascular bone spicules 360, No heme           Refraction     Wearing Rx       Sphere Cylinder Axis Add   Right +0.25 +1.50 003 +2.00   Left +0.25 +0.75 169 +2.00           IMAGING AND PROCEDURES  Imaging and Procedures for @TODAY @  OCT, Retina - OU - Both Eyes       Right Eye Quality was borderline. Central Foveal Thickness: 308. Progression has been stable. Findings include normal foveal contour, no IRF, no SRF, retinal drusen , pigment epithelial detachment, outer retinal atrophy (Persistent vitreous opacities; stable low lying PEDs and ORA, partial PVD).   Left Eye Quality was borderline. Central Foveal Thickness: 391. Progression has worsened. Findings include abnormal foveal contour, retinal drusen , subretinal hyper-reflective material, intraretinal fluid, pigment epithelial detachment, outer retinal atrophy (persistent IRF overlying stable low-lying PED / SRHM temporal fovea and macula -- slightly increased).   Notes *Images captured and stored on drive  Diagnosis / Impression:  OD: non-exu ARMD w/ stable low lying PEDs and  ORA OS: exu ARMD -- persistent IRF overlying stable low-lying PED / SRHM temporal fovea and macula -- slightly increased  Clinical management:  See below  Abbreviations: NFP - Normal foveal profile. CME - cystoid macular edema. PED - pigment epithelial detachment. IRF - intraretinal fluid. SRF - subretinal fluid. EZ - ellipsoid zone. ERM - epiretinal membrane. ORA - outer retinal atrophy. ORT - outer retinal tubulation. SRHM - subretinal hyper-reflective material      Intravitreal Injection, Pharmacologic Agent - OS - Left Eye       Time Out 12/19/2023. 7:52 AM. Confirmed correct patient, procedure, site, and patient consented.   Anesthesia Topical anesthesia was used. Anesthetic medications included Lidocaine  2%, Proparacaine 0.5%.   Procedure Preparation included 5% betadine to ocular surface, eyelid speculum. A (32g) needle was used.   Injection: 8 mg aflibercept  8 MG/0.07ML (Patient supplied)   Route: Intravitreal, Site: Left Eye   NDC: [id number], Lot: [id number], Expiration date: 03/15/2024, Waste: 0 mL   Post-op Post injection exam found visual acuity of at least counting fingers. The patient tolerated the procedure well. There were no complications. The patient received written and verbal post procedure care education. Post injection medications were not given.   Notes **SAMPLE MEDICATION ADMINISTERED**           ASSESSMENT/PLAN:   ICD-10-CM   1. Exudative age-related macular degeneration of left eye with active choroidal neovascularization (HCC)  H35.3221 OCT, Retina - OU - Both Eyes    Intravitreal Injection, Pharmacologic Agent - OS - Left Eye    aflibercept  (EYLEA  HD) ophthalmic injection 8 mg    2. 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 Diabetes mellitus type 2 without retinopathy (HCC)  E11.9     5. Long term (current) use of oral hypoglycemic drugs  Z79.84      6. Long-term (current) use of injectable non-insulin antidiabetic drugs  Z79.85     7. Essential hypertension  I10     8. Hypertensive retinopathy of both eyes  H35.033     9. Combined forms of age-related cataract of right eye  H25.811     10. Pseudophakia  Z96.1     11. Dry eyes  H04.123      1. 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, OS  - s/p IVA OS #1 (03.04.21), #2 (03.04.21), #3 (04.30.21), #4 (01.07.25)  - s/p IVE OS #1 (06.04.21), #2 (07.08.21), #3 (08.09.21), #4 (09.13.21), #5 (10.18.21), #6 (11.24.21), #7 (12.31.21), #8 (02.11.22), #9 (03.25.22), #10 (05.02.22), #11 (06.06.22), #12 (07.13.22), #13 (08.17.22), #14 (09.21.22), #15 (10.26.22), #16 (11.29.22), #17 (01.03.23), #18 (02.07.23), #19 (03.16.23), #20 (04.20.23), #21 (05.19.23), #22 (06.20.23) -- IVE resistance  - s/p IVV OS #1 (07.18.23), #2 (08.15.23), #3 (09.12.23), #4 (10.10.23), #5 (11.14.23), #6 (12.21.23), #7 (01.25.24), #8 (02.29.24), #9 (04.11.24), #10 (05.20.24), #11 (07.01.24), #12 (08.13.24), #13 (10.01.24), #14 (11.26.24), 15 (sample 02.04.25), #16 (sample 03.11.25), #17 (04.08.25), #18 (sample, 05.08.25), #19 (sample, 06.05.25), #20 (sample, 07.03.25), #21 (sample, 07.31.25)  - exam shows central CNV with +subretinal heme -- improved  - BCVA OS 20/25 --stable  - OCT shows persistent IRF overlying stable low-lying PED / SRHM temporal fovea and macula -- slightly increased at 5 weeks (pt delayed 1 wk)  - recommend switching to IVE HD OS #1 [sample] today (09.04.25) with follow up in 4 weeks  - pt in agreement and wishes to proceed with injection  - RBA of procedure discussed, questions answered - see procedure note  - IVV informed consent obtained and signed, 10.01.24  - cont PF and Prolensa QID OS for  possible CME/post-op inflammatory component   - Vabysmo  and Eylea  HD approved, but pt owes 20% due to [employer] funding being unavailable  - f/u 4 weeks -- DFE/OCT, possible injection  2. Age related  macular degeneration, non-exudative, OD  - The incidence, anatomy, and pathology of dry AMD, risk of progression, and the AREDS and AREDS 2 study including smoking risks discussed with patient.   - recommend Amsler grid monitoring  3. Pigmentary retinopathy OU  - peripheral pigmented CR scarring and perivascular pigment clumping / bone spicules OU  - VA remains fairly good (20/20 OD, 20/40 OS with exudative ARMD OS)  - denies night blindness  - denies family history of vision problems  - discussed findings  - broad differential for pigmentary retinopathy:  Congenital (RP), infectious (TB, lyme dx, syphilis, toxo, bartonella), autoimmune and toxic etiologies  - labs checked (4.2.21):  All essential WNL   Rheumatoid factor   CBC   Comprehensive metabolic panel     RPR   ANA   ESR, CRP   ACE   ANCA   Toxoplasma antibodies IgG / IgM   Quantiferon - TB Gold Plus   HLA, A, B, C (IR)   Fluorescent treponemal ab (fla) - IgG - bld   VDRL Serum   Bartonella antibody, 06.04.21  - [facility] -- drawn on 6.4.21                         Results Positive for AGBL5, ALMS1, OPA1 -- unknown significance              - monitor  4-6. Diabetes mellitus, type 2 without retinopathy  - dx in October 2024 -- A1c 11.9 on 10.16.24  - on Ozempic  and metformin  - The incidence, risk factors for progression, natural history and treatment options for diabetic retinopathy  were discussed with patient.   - The need for close monitoring of blood glucose, blood pressure, and serum lipids, avoiding cigarette or any type of tobacco, and the need for long term follow up was also discussed with patient. - f/u in 1 year, sooner prn  7,8. Hypertensive retinopathy OU  - discussed importance of tight BP control  - continue to monitor  9. Mixed form age related cataracts OD  - The symptoms of cataract, surgical options, and treatments and risks were discussed with patient.  - discussed diagnosis and  progression  - under the expert management of Dr. Waylan  10. Pseudophakia OS  - s/p CE/IOL OS (Dr. Waylan, 10.23.24)  - IOL in good position  - OCT shows mild cystic changes - ?CME / post-op inflammatory component  - cont PF and Prolensa QID OS as above  - monitor  11. Dry eyes OU - recommend artificial tears and lubricating ointment as needed  Ophthalmic Meds Ordered this visit:  Meds ordered this encounter  Medications   aflibercept  (EYLEA  HD) ophthalmic injection 8 mg     Return in about 4 weeks (around 01/16/2024) for exu ARMD OS, DFE, OCT, Possible Injxn.  There are no Patient Instructions on file for this visit.  This document serves as a record of services personally performed by Redell JUDITHANN Hans, MD, PhD. It was created on their behalf by Avelina Pereyra, COA an ophthalmic technician. The creation of this record is the provider's dictation and/or activities during the visit.   Electronically signed by: Avelina GORMAN Pereyra, COT  12/19/23  11:17 AM   This document serves  as a record of services personally performed by Redell JUDITHANN Hans, MD, PhD. It was created on their behalf by Almetta Pesa, an ophthalmic technician. The creation of this record is the provider's dictation and/or activities during the visit.    Electronically signed by: Almetta Pesa, OA, 12/19/23  11:17 AM  Redell JUDITHANN Hans, M.D., Ph.D. Diseases & Surgery of the Retina and Vitreous [facility]  I have reviewed the above documentation for accuracy and completeness, and I agree with the above. Redell JUDITHANN Hans, M.D., Ph.D. 12/19/23 11:21 AM   Abbreviations: M myopia (nearsighted); A astigmatism; H hyperopia (farsighted); P presbyopia; Mrx spectacle prescription;  CTL contact lenses; OD right eye; OS left eye; OU both eyes  XT exotropia; ET esotropia; PEK punctate epithelial keratitis; PEE punctate epithelial erosions; DES dry eye syndrome; MGD meibomian gland dysfunction; ATs artificial  tears; PFAT's preservative free artificial tears; NSC nuclear sclerotic cataract; PSC posterior subcapsular cataract; ERM epi-retinal membrane; PVD posterior vitreous detachment; RD retinal detachment; DM diabetes mellitus; DR diabetic retinopathy; NPDR non-proliferative diabetic retinopathy; PDR proliferative diabetic retinopathy; CSME clinically significant macular edema; DME diabetic macular edema; dbh dot blot hemorrhages; CWS cotton wool spot; POAG primary open angle glaucoma; C/D cup-to-disc ratio; HVF humphrey visual field; GVF goldmann visual field; OCT optical coherence tomography; IOP intraocular pressure; BRVO Branch retinal vein occlusion; CRVO central retinal vein occlusion; CRAO central retinal artery occlusion; BRAO branch retinal artery occlusion; RT retinal tear; SB scleral buckle; PPV pars plana vitrectomy; VH Vitreous hemorrhage; PRP panretinal laser photocoagulation; IVK intravitreal kenalog; VMT vitreomacular traction; MH Macular hole;  NVD neovascularization of the disc; NVE neovascularization elsewhere; AREDS age related eye disease study; ARMD age related macular degeneration; POAG primary open angle glaucoma; EBMD epithelial/anterior basement membrane dystrophy; ACIOL anterior chamber intraocular lens; IOL intraocular lens; PCIOL posterior chamber intraocular lens; Phaco/IOL phacoemulsification with intraocular lens placement; PRK photorefractive keratectomy; LASIK laser assisted in situ keratomileusis; HTN hypertension; DM diabetes mellitus; COPD chronic obstructive pulmonary disease

## 2023-12-19 ENCOUNTER — Encounter (INDEPENDENT_AMBULATORY_CARE_PROVIDER_SITE_OTHER): Payer: Self-pay | Admitting: Ophthalmology

## 2023-12-19 ENCOUNTER — Ambulatory Visit (INDEPENDENT_AMBULATORY_CARE_PROVIDER_SITE_OTHER): Admitting: Ophthalmology

## 2023-12-19 DIAGNOSIS — H353221 Exudative age-related macular degeneration, left eye, with active choroidal neovascularization: Secondary | ICD-10-CM | POA: Diagnosis not present

## 2023-12-19 DIAGNOSIS — H04123 Dry eye syndrome of bilateral lacrimal glands: Secondary | ICD-10-CM

## 2023-12-19 DIAGNOSIS — H353112 Nonexudative age-related macular degeneration, right eye, intermediate dry stage: Secondary | ICD-10-CM

## 2023-12-19 DIAGNOSIS — H35033 Hypertensive retinopathy, bilateral: Secondary | ICD-10-CM

## 2023-12-19 DIAGNOSIS — Z961 Presence of intraocular lens: Secondary | ICD-10-CM

## 2023-12-19 DIAGNOSIS — Z7985 Long-term (current) use of injectable non-insulin antidiabetic drugs: Secondary | ICD-10-CM

## 2023-12-19 DIAGNOSIS — E119 Type 2 diabetes mellitus without complications: Secondary | ICD-10-CM | POA: Diagnosis not present

## 2023-12-19 DIAGNOSIS — H3552 Pigmentary retinal dystrophy: Secondary | ICD-10-CM

## 2023-12-19 DIAGNOSIS — I10 Essential (primary) hypertension: Secondary | ICD-10-CM | POA: Diagnosis not present

## 2023-12-19 DIAGNOSIS — H25811 Combined forms of age-related cataract, right eye: Secondary | ICD-10-CM | POA: Diagnosis not present

## 2023-12-19 DIAGNOSIS — Z7984 Long term (current) use of oral hypoglycemic drugs: Secondary | ICD-10-CM | POA: Diagnosis not present

## 2023-12-19 MED ORDER — AFLIBERCEPT 8 MG/0.07ML IZ SOLN
8.0000 mg | INTRAVITREAL | Status: AC | PRN
  Administered 2023-12-19: 8 mg via INTRAVITREAL

## 2024-01-02 ENCOUNTER — Ambulatory Visit: Admitting: Family Medicine

## 2024-01-02 ENCOUNTER — Encounter: Payer: Self-pay | Admitting: Family Medicine

## 2024-01-02 VITALS — BP 134/86 | HR 90 | Ht 72.0 in | Wt 228.0 lb

## 2024-01-02 DIAGNOSIS — E1169 Type 2 diabetes mellitus with other specified complication: Secondary | ICD-10-CM

## 2024-01-02 DIAGNOSIS — Z716 Tobacco abuse counseling: Secondary | ICD-10-CM

## 2024-01-02 MED ORDER — VARENICLINE TARTRATE (STARTER) 0.5 MG X 11 & 1 MG X 42 PO TBPK: ORAL_TABLET | ORAL | 0 refills | Status: AC

## 2024-01-02 MED ORDER — VARENICLINE TARTRATE 1 MG PO TABS: 1.0000 mg | ORAL_TABLET | Freq: Two times a day (BID) | ORAL | 2 refills | Status: AC

## 2024-01-02 NOTE — Progress Notes (Signed)
 BP 134/86   Pulse 90   Ht 6' (1.829 m)   Wt 228 lb (103.4 kg)   SpO2 94%   BMI 30.92 kg/m    Subjective:   [name], male    DOB: 10/28/1955, 68 y.o.   MRN: [id number]  HPI: Eric Key is a 68 y.o. male presenting on 01/02/2024 for Hypoglycemia   Discussed the use of AI scribe software for clinical note transcription with the patient, who gave verbal consent to proceed.  History of Present Illness   Eric Key is a 68 year old male who presents with low blood sugar episodes.  He has been experiencing low blood sugar episodes for the past two weeks, with frequent alerts from his glucose monitor, especially after meals and during the night. These episodes are most common around 6 PM, coinciding with his return home from work. He stopped taking Ozempic  0.5 mg two months ago, but continues to experience these episodes.  His glucose sensor malfunctioned after nine days, necessitating a replacement, and the new sensor expired two days ago. His blood sugar tends to drop about an hour to an hour and a half after meals, such as a double cheeseburger without buns from [employer]. Although he does not feel symptomatic during these episodes, he is concerned about the frequent alarms and the potential risk of passing out.  He works at a [employer] claims handler in [place] and has recently increased his physical activity, including tasks like driving a forklift. He typically eats lunch around 11 to 11:30 AM and dinner around 7 PM. He has experimented with different types of bread, noting that some cause his blood sugar to spike more than others.  He is seeking a prescription for Chantix , having previously used bupropion  for smoking cessation but relapsed after three weeks. He describes the difficulty of quitting smoking, comparing it to a progression from 'puff today, pack tomorrow, carton by the weekend'. He has been using sugar-free peanut butter and has a sweet tooth, which  he satisfies with homemade sugar-free peanut butter and white chocolate fudge.          Relevant past medical, surgical, family and social history reviewed and updated as indicated. Interim medical history since our last visit reviewed. Allergies and medications reviewed and updated.  Review of Systems  Constitutional:  Negative for chills and fever.  Eyes:  Negative for visual disturbance.  Respiratory:  Negative for shortness of breath and wheezing.   Cardiovascular:  Negative for chest pain and leg swelling.  Skin:  Negative for rash.  Neurological:  Negative for dizziness, weakness and light-headedness.  All other systems reviewed and are negative.   Per HPI unless specifically indicated above   Allergies as of 01/02/2024       Reactions   Penicillins Hives        Medication List        Accurate as of January 02, 2024  9:20 AM. If you have any questions, ask your nurse or doctor.          STOP taking these medications    Ozempic  (0.25 or 0.5 MG/DOSE) 2 MG/3ML Sopn Generic drug: Semaglutide (0.25 or 0.5MG /DOS) Stopped by: Fonda LABOR Delisia Mcquiston       TAKE these medications    FreeStyle Libre 3 Plus Sensor Misc 1 sensor change every 15 days.   Varenicline  Tartrate (Starter) 0.5 MG X 11 & 1 MG X 42 Tbpk Commonly known as: Chantix  Starting Month Pak Take  0.5 mg by mouth daily for 3 days, THEN 0.5 mg 2 (two) times daily for 4 days, THEN 1 mg 2 (two) times daily for 23 days. Start taking on: January 02, 2024 Started by: Fonda A Lashelle Koy   varenicline  1 MG tablet Commonly known as: Chantix  Continuing Month Pak Take 1 tablet (1 mg total) by mouth 2 (two) times daily. Started by: Fonda LABOR Ledger Heindl         Objective:   BP 134/86   Pulse 90   Ht 6' (1.829 m)   Wt 228 lb (103.4 kg)   SpO2 94%   BMI 30.92 kg/m   Wt Readings from Last 3 Encounters:  01/02/24 228 lb (103.4 kg)  08/16/23 222 lb (100.7 kg)  05/15/23 226 lb (102.5 kg)    Physical  Exam Vitals and nursing note reviewed.  Constitutional:      General: He is not in acute distress.    Appearance: He is well-developed. He is not diaphoretic.  Eyes:     General: No scleral icterus.    Conjunctiva/sclera: Conjunctivae normal.  Neck:     Thyroid: No thyromegaly.  Cardiovascular:     Rate and Rhythm: Normal rate and regular rhythm.     Heart sounds: Normal heart sounds. No murmur heard. Pulmonary:     Effort: Pulmonary effort is normal. No respiratory distress.     Breath sounds: Normal breath sounds. No wheezing.  Skin:    General: Skin is warm and dry.     Findings: No rash.  Neurological:     Mental Status: He is alert and oriented to person, place, and time.     Coordination: Coordination normal.  Psychiatric:        Behavior: Behavior normal.    Physical Exam   CHEST: Lungs clear to auscultation bilaterally. CARDIOVASCULAR: Regular heart rate and rhythm, no murmurs.         Assessment & Plan:   Problem List Items Addressed This Visit       Endocrine   Type 2 diabetes mellitus with other specified complication (HCC) - Primary   Other Visit Diagnoses       Encounter for smoking cessation counseling       Relevant Medications   Varenicline  Tartrate, Starter, (CHANTIX  STARTING MONTH PAK) 0.5 MG X 11 & 1 MG X 42 TBPK   varenicline  (CHANTIX  CONTINUING MONTH PAK) 1 MG tablet          Type 2 diabetes mellitus with hypoglycemia Hypoglycemic episodes likely due to increased physical activity and dietary patterns. No significant symptoms during episodes. - Advise mid-afternoon snack around 3 or 4 PM with carbohydrates or protein to prevent hypoglycemia. - Encourage hydration to maintain blood glucose levels. - Monitor blood glucose levels and report persistent hypoglycemia over the next week.  Nicotine dependence with counseling for cessation Relapsed after three weeks of cessation with bupropion . Interested in trying Chantix . - Prescribe Chantix   starter pack followed by continuation pack. - Advise to start with the Chantix  starter pack and follow up with the continuation pack. - Encourage to report back if Chantix  is ineffective, with option to return to bupropion .          Follow up plan: Return in about 2 months (around 03/03/2024), or if symptoms worsen or fail to improve, for Diabetes recheck.  Counseling provided for all of the vaccine components No orders of the defined types were placed in this encounter.   Fonda Levins, MD [facility]  01/02/2024, 9:20 AM

## 2024-01-15 NOTE — Progress Notes (Shared)
 [facility] - Clinic Note  01/16/2024    CHIEF COMPLAINT Patient presents for Retina Follow Up  HISTORY OF PRESENT ILLNESS: Eric Key is a 68 y.o. male who presents to the clinic today for:   HPI     Retina Follow Up   Patient presents with  Wet AMD.  In left eye.  This started 4 years ago.  Severity is moderate.  Duration of 4 weeks.  Since onset it is stable.  I, the attending physician,  performed the HPI with the patient and updated documentation appropriately.        Comments   Pt denies changes in vision/FOL/floaters/pain. Pt has been on a glucose monitor because at his dr visit he was at 11% A1c but since he has been watching his sugar he is stable at 5.9% and his BS now is 128 non fasting. Pt noticed that in the left eye the letters were going up a hill on the [place] chart. Pt does not use drops or areds.      Last edited by [name], MD on 01/16/2024 10:44 AM.    Pt states    Referring physician: [name], Fonda LABOR, MD [address] [place],  [place] [address]  HISTORICAL INFORMATION:   Selected notes from the MEDICAL RECORD NUMBER Referred by Dr. Deanne Mate for concern of mac heme OS.   CURRENT MEDICATIONS: No current outpatient medications on file. (Ophthalmic Drugs)   No current facility-administered medications for this visit. (Ophthalmic Drugs)   Current Outpatient Medications (Other)  Medication Sig   Continuous Glucose Sensor (FREESTYLE LIBRE 3 PLUS SENSOR) MISC 1 sensor change every 15 days.   varenicline  (CHANTIX  CONTINUING MONTH PAK) 1 MG tablet Take 1 tablet (1 mg total) by mouth 2 (two) times daily.   Varenicline  Tartrate, Starter, (CHANTIX  STARTING MONTH PAK) 0.5 MG X 11 & 1 MG X 42 TBPK Take 0.5 mg by mouth daily for 3 days, THEN 0.5 mg 2 (two) times daily for 4 days, THEN 1 mg 2 (two) times daily for 23 days.   No current facility-administered medications for this visit. (Other)   REVIEW OF SYSTEMS: ROS   Positive  for: Endocrine, Eyes Negative for: Constitutional, Gastrointestinal, Neurological, Skin, Genitourinary, Musculoskeletal, HENT, Cardiovascular, Respiratory, Psychiatric, Allergic/Imm, Heme/Lymph Last edited by Elnor Avelina RAMAN, COT on 01/16/2024  7:55 AM.     ALLERGIES Allergies  Allergen Reactions   Penicillins Hives   PAST MEDICAL HISTORY Past Medical History:  Diagnosis Date   Allergy    Penicillin   Cataract    Mixed form OU   History of adenomatous polyps of colon 03/16/2020   Hypertension    past hx    Hypertensive retinopathy    OU   Macular degeneration    Dry OD; Wet OS   Past Surgical History:  Procedure Laterality Date   ACHILLES TENDON REPAIR  2010   CHOLECYSTECTOMY     COLONOSCOPY     EYE SURGERY     GALLBLADDER SURGERY     FAMILY HISTORY Family History  Problem Relation Age of Onset   Diabetes Brother    Heart disease Brother    Diabetes Maternal Grandfather    Colon polyps Neg Hx    Colon cancer Neg Hx    Esophageal cancer Neg Hx    Rectal cancer Neg Hx    Stomach cancer Neg Hx    SOCIAL HISTORY Social History   Tobacco Use   Smoking status:  Some Days    Current packs/day: 0.75    Average packs/day: 0.8 packs/day for 41.0 years (30.8 ttl pk-yrs)    Types: Cigars, Cigarettes   Smokeless tobacco: Never  Vaping Use   Vaping status: Never Used  Substance Use Topics   Alcohol use: Not Currently    Comment: occ   Drug use: Not Currently       OPHTHALMIC EXAM: Base Eye Exam     Visual Acuity (Snellen - Linear)       Right Left   Dist [place] 20/20 -2 20/30 -2   Dist ph [place] NI NI    Correction: Glasses         Tonometry (Tonopen, 7:50 AM)       Right Left   Pressure 13 12         Neuro/Psych     Oriented x3: Yes   Mood/Affect: Normal         Dilation     Both eyes: 1.0% Mydriacyl, 2.5% Phenylephrine @ 7:50 AM           Slit Lamp and Fundus Exam     Slit Lamp Exam       Right Left   Lids/Lashes Dermatochalasis -  upper lid Dermatochalasis - upper lid   Conjunctiva/Sclera White and quiet White and quiet   Cornea trace PEE, mild Arcus, tear film debris mild Arcus, 2-3+ fine punctate epithelial erosions inferiorly, tear film debris, well healed cataract wound   Anterior Chamber deep, clear, narrow angles deep, 0.5+cell/pigment   Iris Round and dilated, mild anterior bowing Round and moderately dilated, mild anterior bowing   Lens 2-3+ Nuclear sclerosis, 2-3+ Cortical cataract PC IOL in good position   Anterior Vitreous Vitreous syneresis Vitreous syneresis, fine cell/pigment         Fundus Exam       Right Left   Disc Pink and Sharp, focal PPP ST, Compact Pink and Sharp, Compact   C/D Ratio 0.2 0.4   Macula Flat, Drusen, RPE mottling and clumping, early Atrophy, punctate IRH -- fading abnormal foveal reflex, +CNV with heme stably improved, +central edema / cystic changes -- persistent, Drusen, RPE mottling and clumping, mild subretinal fibrosis, no heme   Vessels attenuated, tortuous, perivascular pigment clumping peripherally, mild AV crossing changes attenuated, mild tortuousity, perivascular pigment clumping peripherally   Periphery Attached, peripheral pigmented CR scarring / atrophy and perivascular bone spicules 360, No heme Attached, peripheral pigmented CR scarring / atrophy and perivascular bone spicules 360, No heme           IMAGING AND PROCEDURES  Imaging and Procedures for @TODAY @  OCT, Retina - OU - Both Eyes       Right Eye Quality was borderline. Central Foveal Thickness: 302. Progression has been stable. Findings include normal foveal contour, no IRF, no SRF, retinal drusen , pigment epithelial detachment, outer retinal atrophy (Persistent vitreous opacities; stable low lying PEDs and ORA, partial PVD).   Left Eye Quality was borderline. Central Foveal Thickness: 404. Progression has been stable. Findings include abnormal foveal contour, retinal drusen , subretinal  hyper-reflective material, intraretinal fluid, pigment epithelial detachment, outer retinal atrophy (persistent IRF overlying stable low-lying PED / SRHM temporal fovea and macula).   Notes *Images captured and stored on drive  Diagnosis / Impression:  OD: non-exu ARMD w/ stable low lying PEDs and ORA OS: exu ARMD -- persistent IRF overlying stable low-lying PED / SRHM temporal fovea and macula   Clinical management:  See  below  Abbreviations: NFP - Normal foveal profile. CME - cystoid macular edema. PED - pigment epithelial detachment. IRF - intraretinal fluid. SRF - subretinal fluid. EZ - ellipsoid zone. ERM - epiretinal membrane. ORA - outer retinal atrophy. ORT - outer retinal tubulation. SRHM - subretinal hyper-reflective material      Intravitreal Injection, Pharmacologic Agent - OS - Left Eye       Time Out 01/16/2024. 8:35 AM. Confirmed correct patient, procedure, site, and patient consented.   Anesthesia Topical anesthesia was used. Anesthetic medications included Lidocaine  2%, Proparacaine 0.5%.   Procedure Preparation included 5% betadine to ocular surface, eyelid speculum. A (32g) needle was used.   Injection: 8 mg aflibercept  8 MG/0.07ML (Patient supplied)   Route: Intravitreal, Site: Left Eye   NDC: 38244-949-48, Lot: [id number], Expiration date: 03/15/2024, Waste: 0 mL   Post-op Post injection exam found visual acuity of at least counting fingers. The patient tolerated the procedure well. There were no complications. The patient received written and verbal post procedure care education. Post injection medications were not given.   Notes **SAMPLE MEDICATION ADMINISTERED**           ASSESSMENT/PLAN:   ICD-10-CM   1. Exudative age-related macular degeneration of left eye with active choroidal neovascularization (HCC)  H35.3221 OCT, Retina - OU - Both Eyes    Intravitreal Injection, Pharmacologic Agent - OS - Left Eye    aflibercept  (EYLEA  HD) ophthalmic  injection 8 mg    2. 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 Diabetes mellitus type 2 without retinopathy (HCC)  E11.9     5. Long term (current) use of oral hypoglycemic drugs  Z79.84     6. Long-term (current) use of injectable non-insulin antidiabetic drugs  Z79.85     7. Essential hypertension  I10     8. Hypertensive retinopathy of both eyes  H35.033     9. Combined forms of age-related cataract of right eye  H25.811     10. Pseudophakia  Z96.1     11. Dry eyes  H04.123      1. 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, OS  - s/p IVA OS #1 (03.04.21), #2 (03.04.21), #3 (04.30.21), #4 (01.07.25)  - s/p IVE OS #1 (06.04.21), #2 (07.08.21), #3 (08.09.21), #4 (09.13.21), #5 (10.18.21), #6 (11.24.21), #7 (12.31.21), #8 (02.11.22), #9 (03.25.22), #10 (05.02.22), #11 (06.06.22), #12 (07.13.22), #13 (08.17.22), #14 (09.21.22), #15 (10.26.22), #16 (11.29.22), #17 (01.03.23), #18 (02.07.23), #19 (03.16.23), #20 (04.20.23), #21 (05.19.23), #22 (06.20.23) -- IVE resistance  - s/p IVV OS #1 (07.18.23), #2 (08.15.23), #3 (09.12.23), #4 (10.10.23), #5 (11.14.23), #6 (12.21.23), #7 (01.25.24), #8 (02.29.24), #9 (04.11.24), #10 (05.20.24), #11 (07.01.24), #12 (08.13.24), #13 (10.01.24), #14 (11.26.24), 15 (sample 02.04.25), #16 (sample 03.11.25), #17 (04.08.25), #18 (sample, 05.08.25), #19 (sample, 06.05.25), #20 (sample, 07.03.25), #21 (sample, 07.31.25)  - s/p IVE HD OS #1 (09.04.25--sample)  - exam shows central CNV with +subretinal heme -- stably improved  - BCVA OS 20/30 down from 20/25  - OCT shows persistent IRF overlying stable low-lying PED / [facility] temporal fovea and macula at 4 weeks  - recommend IVE HD OS #2 [sample] today (10.02.25) with follow up in 4 weeks  - pt in agreement and wishes to proceed with injection  - RBA of procedure discussed, questions answered - see procedure note  - IVV  informed consent obtained and signed, 10.01.24  - cont PF  and Prolensa QID OS for possible CME/post-op inflammatory component   - Vabysmo  and Eylea  HD approved, but pt owes 20% due to [employer] funding being unavailable  - f/u 4-5weeks -- DFE/OCT, possible injection  2. Age related macular degeneration, non-exudative, OD  - The incidence, anatomy, and pathology of dry AMD, risk of progression, and the AREDS and AREDS 2 study including smoking risks discussed with patient.   - recommend Amsler grid monitoring  3. Pigmentary retinopathy OU  - peripheral pigmented CR scarring and perivascular pigment clumping / bone spicules OU  - VA remains fairly good (20/20 OD, 20/40 OS with exudative ARMD OS)  - denies night blindness  - denies family history of vision problems  - discussed findings  - broad differential for pigmentary retinopathy:  Congenital (RP), infectious (TB, lyme dx, syphilis, toxo, bartonella), autoimmune and toxic etiologies  - labs checked (4.2.21):  All essential WNL   Rheumatoid factor   CBC   Comprehensive metabolic panel     RPR   ANA   ESR, CRP   ACE   ANCA   Toxoplasma antibodies IgG / IgM   Quantiferon - TB Gold Plus   HLA, A, B, C (IR)   Fluorescent treponemal ab (fla) - IgG - bld   VDRL Serum   Bartonella antibody, 06.04.21  - [facility] -- drawn on 6.4.21                         Results Positive for AGBL5, ALMS1, OPA1 -- unknown significance              - monitor  4-6. Diabetes mellitus, type 2 without retinopathy  - dx in October 2024 -- A1c 11.9 on 10.16.24  - on Ozempic  and metformin  - The incidence, risk factors for progression, natural history and treatment options for diabetic retinopathy  were discussed with patient.   - The need for close monitoring of blood glucose, blood pressure, and serum lipids, avoiding cigarette or any type of tobacco, and the need for long term follow up was also discussed with patient. - f/u in 1 year, sooner  prn  7,8. Hypertensive retinopathy OU  - discussed importance of tight BP control  - continue to monitor  9. Mixed form age related cataracts OD  - The symptoms of cataract, surgical options, and treatments and risks were discussed with patient.  - discussed diagnosis and progression  - under the expert management of Dr. Waylan  10. Pseudophakia OS  - s/p CE/IOL OS (Dr. Waylan, 10.23.24)  - IOL in good position  - OCT shows mild cystic changes - ?CME / post-op inflammatory component  - cont PF and Prolensa QID OS as above  - monitor  11. Dry eyes OU - recommend artificial tears and lubricating ointment as needed  Ophthalmic Meds Ordered this visit:  Meds ordered this encounter  Medications   aflibercept  (EYLEA  HD) ophthalmic injection 8 mg     Return for 4-5wks exu ARMD OS, DFE, OCT, Possible Injxn.  There are no Patient Instructions on file for this visit.  This document serves as a record of services personally performed by Redell JUDITHANN Hans, MD, PhD. It was created on their behalf by Auston Muzzy, COMT. The creation of this record is the provider's dictation and/or activities during the visit.  Electronically signed by: Auston Muzzy, COMT 01/16/24 11:30 AM  This document serves as a record of services personally performed by Redell MATSU.  Valdemar, MD, PhD. It was created on their behalf by Almetta Pesa, an ophthalmic technician. The creation of this record is the provider's dictation and/or activities during the visit.    Electronically signed by: Almetta Pesa, OA, 01/16/24  11:30 AM   Redell JUDITHANN Valdemar, M.D., Ph.D. Diseases & Surgery of the Retina and Vitreous [facility]  I have reviewed the above documentation for accuracy and completeness, and I agree with the above. Redell JUDITHANN Valdemar, M.D., Ph.D. 01/16/24 11:30 AM   Abbreviations: M myopia (nearsighted); A astigmatism; H hyperopia (farsighted); P presbyopia; Mrx spectacle prescription;  CTL  contact lenses; OD right eye; OS left eye; OU both eyes  XT exotropia; ET esotropia; PEK punctate epithelial keratitis; PEE punctate epithelial erosions; DES dry eye syndrome; MGD meibomian gland dysfunction; ATs artificial tears; PFAT's preservative free artificial tears; NSC nuclear sclerotic cataract; PSC posterior subcapsular cataract; ERM epi-retinal membrane; PVD posterior vitreous detachment; RD retinal detachment; DM diabetes mellitus; DR diabetic retinopathy; NPDR non-proliferative diabetic retinopathy; PDR proliferative diabetic retinopathy; CSME clinically significant macular edema; DME diabetic macular edema; dbh dot blot hemorrhages; CWS cotton wool spot; POAG primary open angle glaucoma; C/D cup-to-disc ratio; HVF humphrey visual field; GVF goldmann visual field; OCT optical coherence tomography; IOP intraocular pressure; BRVO Branch retinal vein occlusion; CRVO central retinal vein occlusion; CRAO central retinal artery occlusion; BRAO branch retinal artery occlusion; RT retinal tear; SB scleral buckle; PPV pars plana vitrectomy; VH Vitreous hemorrhage; PRP panretinal laser photocoagulation; IVK intravitreal kenalog; VMT vitreomacular traction; MH Macular hole;  NVD neovascularization of the disc; NVE neovascularization elsewhere; AREDS age related eye disease study; ARMD age related macular degeneration; POAG primary open angle glaucoma; EBMD epithelial/anterior basement membrane dystrophy; ACIOL anterior chamber intraocular lens; IOL intraocular lens; PCIOL posterior chamber intraocular lens; Phaco/IOL phacoemulsification with intraocular lens placement; PRK photorefractive keratectomy; LASIK laser assisted in situ keratomileusis; HTN hypertension; DM diabetes mellitus; COPD chronic obstructive pulmonary disease

## 2024-01-16 ENCOUNTER — Ambulatory Visit (INDEPENDENT_AMBULATORY_CARE_PROVIDER_SITE_OTHER): Admitting: Ophthalmology

## 2024-01-16 ENCOUNTER — Encounter (INDEPENDENT_AMBULATORY_CARE_PROVIDER_SITE_OTHER): Payer: Self-pay | Admitting: Ophthalmology

## 2024-01-16 DIAGNOSIS — H04123 Dry eye syndrome of bilateral lacrimal glands: Secondary | ICD-10-CM

## 2024-01-16 DIAGNOSIS — Z7984 Long term (current) use of oral hypoglycemic drugs: Secondary | ICD-10-CM

## 2024-01-16 DIAGNOSIS — H3552 Pigmentary retinal dystrophy: Secondary | ICD-10-CM

## 2024-01-16 DIAGNOSIS — E119 Type 2 diabetes mellitus without complications: Secondary | ICD-10-CM

## 2024-01-16 DIAGNOSIS — H25811 Combined forms of age-related cataract, right eye: Secondary | ICD-10-CM

## 2024-01-16 DIAGNOSIS — I10 Essential (primary) hypertension: Secondary | ICD-10-CM

## 2024-01-16 DIAGNOSIS — Z7985 Long-term (current) use of injectable non-insulin antidiabetic drugs: Secondary | ICD-10-CM

## 2024-01-16 DIAGNOSIS — H353112 Nonexudative age-related macular degeneration, right eye, intermediate dry stage: Secondary | ICD-10-CM

## 2024-01-16 DIAGNOSIS — H353221 Exudative age-related macular degeneration, left eye, with active choroidal neovascularization: Secondary | ICD-10-CM | POA: Diagnosis not present

## 2024-01-16 DIAGNOSIS — Z961 Presence of intraocular lens: Secondary | ICD-10-CM

## 2024-01-16 DIAGNOSIS — H35033 Hypertensive retinopathy, bilateral: Secondary | ICD-10-CM | POA: Diagnosis not present

## 2024-01-16 MED ORDER — AFLIBERCEPT 8 MG/0.07ML IZ SOLN
8.0000 mg | INTRAVITREAL | Status: AC | PRN
  Administered 2024-01-16: 8 mg via INTRAVITREAL

## 2024-02-04 NOTE — Progress Notes (Signed)
 [facility] - Clinic Note  02/18/2024    CHIEF COMPLAINT Patient presents for Retina Follow Up  HISTORY OF PRESENT ILLNESS: Eric Key is a 68 y.o. male who presents to the clinic today for:   HPI     Retina Follow Up   Patient presents with  Wet AMD.  In left eye.  This started 4 weeks ago.  I, the attending physician,  performed the HPI with the patient and updated documentation appropriately.        Comments   Patient here for 4 weeks retina follow up for exu ARMD OS. Patient states vision doing fine. No eye pain.       Last edited by Eric Rogue, MD on 02/18/2024  7:10 PM.    Pt states vision is the same.    Referring physician: [name], Key LABOR, MD [address] [place],  [place] [address]  HISTORICAL INFORMATION:   Selected notes from the MEDICAL RECORD NUMBER Referred by Dr. Deanne Key for concern of mac heme OS.   CURRENT MEDICATIONS: No current outpatient medications on file. (Ophthalmic Drugs)   No current facility-administered medications for this visit. (Ophthalmic Drugs)   Current Outpatient Medications (Other)  Medication Sig   Continuous Glucose Sensor (FREESTYLE LIBRE 3 PLUS SENSOR) MISC 1 sensor change every 15 days.   varenicline  (CHANTIX  CONTINUING MONTH PAK) 1 MG tablet Take 1 tablet (1 mg total) by mouth 2 (two) times daily.   No current facility-administered medications for this visit. (Other)   REVIEW OF SYSTEMS: ROS   Positive for: Endocrine, Eyes Negative for: Constitutional, Gastrointestinal, Neurological, Skin, Genitourinary, Musculoskeletal, HENT, Cardiovascular, Respiratory, Psychiatric, Allergic/Imm, Heme/Lymph Last edited by Eric Key, COA on 02/18/2024  8:01 AM.      ALLERGIES Allergies  Allergen Reactions   Penicillins Hives   PAST MEDICAL HISTORY Past Medical History:  Diagnosis Date   Allergy    Penicillin   Cataract    Mixed form OU   History of adenomatous polyps of colon  03/16/2020   Hypertension    past hx    Hypertensive retinopathy    OU   Macular degeneration    Dry OD; Wet OS   Past Surgical History:  Procedure Laterality Date   ACHILLES TENDON REPAIR  2010   CHOLECYSTECTOMY     COLONOSCOPY     EYE SURGERY     GALLBLADDER SURGERY     FAMILY HISTORY Family History  Problem Relation Age of Onset   Diabetes Brother    Heart disease Brother    Diabetes Maternal Grandfather    Colon polyps Neg Hx    Colon cancer Neg Hx    Esophageal cancer Neg Hx    Rectal cancer Neg Hx    Stomach cancer Neg Hx    SOCIAL HISTORY Social History   Tobacco Use   Smoking status: Some Days    Current packs/day: 0.75    Average packs/day: 0.8 packs/day for 41.0 years (30.8 ttl pk-yrs)    Types: Cigars, Cigarettes   Smokeless tobacco: Never  Vaping Use   Vaping status: Never Used  Substance Use Topics   Alcohol use: Not Currently    Comment: occ   Drug use: Not Currently       OPHTHALMIC EXAM: Base Eye Exam     Visual Acuity (Snellen - Linear)       Right Left   Dist cc 20/20 20/30    Correction: Glasses  Tonometry (Tonopen, 7:59 AM)       Right Left   Pressure 18 13         Pupils       Dark Light Shape React APD   Right 3 2 Round Brisk None   Left 3 2 Round Brisk None         Visual Fields (Counting fingers)       Left Right    Full Full         Extraocular Movement       Right Left    Full, Ortho Full, Ortho         Neuro/Psych     Oriented x3: Yes   Mood/Affect: Normal         Dilation     Both eyes: 1.0% Mydriacyl, 2.5% Phenylephrine @ 7:59 AM           Slit Lamp and Fundus Exam     Slit Lamp Exam       Right Left   Lids/Lashes Dermatochalasis - upper lid Dermatochalasis - upper lid   Conjunctiva/Sclera White and quiet White and quiet   Cornea trace PEE, mild Arcus, tear film debris mild Arcus, 2-3+ fine punctate epithelial erosions inferiorly, tear film debris, well healed  cataract wound   Anterior Chamber deep, clear, narrow angles deep, 0.5+cell/pigment   Iris Round and dilated, mild anterior bowing Round and moderately dilated, mild anterior bowing   Lens 2-3+ Nuclear sclerosis, 2-3+ Cortical cataract PC IOL in good position   Anterior Vitreous Vitreous syneresis Vitreous syneresis, fine cell/pigment         Fundus Exam       Right Left   Disc Pink and Sharp, focal PPP ST, Compact Pink and Sharp, Compact   C/D Ratio 0.2 0.4   Macula Flat, Drusen, RPE mottling and clumping, early Atrophy, punctate IRH -- fading abnormal foveal reflex, +CNV with heme stably improved, +central edema / cystic changes -- persistent, Drusen, RPE mottling and clumping, mild subretinal fibrosis, no heme   Vessels attenuated, tortuous, perivascular pigment clumping peripherally, mild AV crossing changes attenuated, mild tortuousity, perivascular pigment clumping peripherally   Periphery Attached, peripheral pigmented CR scarring / atrophy and perivascular bone spicules 360, No heme Attached, peripheral pigmented CR scarring / atrophy and perivascular bone spicules 360, No heme           Refraction     Wearing Rx       Sphere Cylinder Axis Add   Right +0.25 +1.50 003 +2.00   Left +0.25 +0.75 169 +2.00           IMAGING AND PROCEDURES  Imaging and Procedures for @TODAY @  OCT, Retina - OU - Both Eyes       Right Eye Quality was borderline. Central Foveal Thickness: 307. Progression has been stable. Findings include normal foveal contour, no IRF, no SRF, retinal drusen , pigment epithelial detachment, outer retinal atrophy (Persistent vitreous opacities; stable low lying PEDs and ORA, partial PVD).   Left Eye Quality was borderline. Central Foveal Thickness: 411. Progression has been stable. Findings include abnormal foveal contour, retinal drusen , subretinal hyper-reflective material, intraretinal fluid, pigment epithelial detachment, outer retinal atrophy  (persistent IRF overlying stable low-lying PED / SRHM temporal fovea and macula).   Notes *Images captured and stored on drive  Diagnosis / Impression:  OD: non-exu ARMD w/ stable low lying PEDs and ORA OS: exu ARMD -- persistent IRF overlying stable low-lying PED / [facility] temporal  fovea and macula   Clinical management:  See below  Abbreviations: NFP - Normal foveal profile. CME - cystoid macular edema. PED - pigment epithelial detachment. IRF - intraretinal fluid. SRF - subretinal fluid. EZ - ellipsoid zone. ERM - epiretinal membrane. ORA - outer retinal atrophy. ORT - outer retinal tubulation. SRHM - subretinal hyper-reflective material      Intravitreal Injection, Pharmacologic Agent - OS - Left Eye       Time Out 02/18/2024. 8:05 AM. Confirmed correct patient, procedure, site, and patient consented.   Anesthesia Topical anesthesia was used. Anesthetic medications included Lidocaine  2%, Proparacaine 0.5%.   Procedure Preparation included 5% betadine to ocular surface, eyelid speculum. A (32g) needle was used.   Injection: 8 mg aflibercept  8 MG/0.07ML (Patient supplied)   Route: Intravitreal, Site: Left Eye   NDC: 38244-949-48, Lot: [id number], Expiration date: 03/15/2024, Waste: 0 mL   Post-op Post injection exam found visual acuity of at least counting fingers. The patient tolerated the procedure well. There were no complications. The patient received written and verbal post procedure care education. Post injection medications were not given.   Notes **SAMPLE MEDICATION ADMINISTERED**           ASSESSMENT/PLAN:   ICD-10-CM   1. Exudative age-related macular degeneration of left eye with active choroidal neovascularization (HCC)  H35.3221 OCT, Retina - OU - Both Eyes    Intravitreal Injection, Pharmacologic Agent - OS - Left Eye    aflibercept  (EYLEA  HD) ophthalmic injection 8 mg    2. 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 Diabetes mellitus type 2 without retinopathy (HCC)  E11.9     5. Long term (current) use of oral hypoglycemic drugs  Z79.84     6. Long-term (current) use of injectable non-insulin antidiabetic drugs  Z79.85     7. Essential hypertension  I10     8. Hypertensive retinopathy of both eyes  H35.033     9. Combined forms of age-related cataract of right eye  H25.811     10. Pseudophakia  Z96.1     11. Dry eyes  H04.123       1. 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, OS  - s/p IVA OS #1 (03.04.21), #2 (03.04.21), #3 (04.30.21), #4 (01.07.25)  - s/p IVE OS #1 (06.04.21), #2 (07.08.21), #3 (08.09.21), #4 (09.13.21), #5 (10.18.21), #6 (11.24.21), #7 (12.31.21), #8 (02.11.22), #9 (03.25.22), #10 (05.02.22), #11 (06.06.22), #12 (07.13.22), #13 (08.17.22), #14 (09.21.22), #15 (10.26.22), #16 (11.29.22), #17 (01.03.23), #18 (02.07.23), #19 (03.16.23), #20 (04.20.23), #21 (05.19.23), #22 (06.20.23) -- IVE resistance  - s/p IVV OS #1 (07.18.23), #2 (08.15.23), #3 (09.12.23), #4 (10.10.23), #5 (11.14.23), #6 (12.21.23), #7 (01.25.24), #8 (02.29.24), #9 (04.11.24), #10 (05.20.24), #11 (07.01.24), #12 (08.13.24), #13 (10.01.24), #14 (11.26.24), 15 (sample 02.04.25), #16 (sample 03.11.25), #17 (04.08.25), #18 (sample, 05.08.25), #19 (sample, 06.05.25), #20 (sample, 07.03.25), #21 (sample, 07.31.25)  - s/p IVE HD OS #1 (09.04.25--sample), #2 (sample 10.02.25)  - exam shows central CNV with +subretinal heme -- stably improved  - BCVA OS 20/30 stable  - OCT shows persistent IRF overlying stable low-lying PED / SRHM temporal fovea and macula at 4 weeks  - recommend IVE HD OS #3 [sample] today (11.04.25) with follow up in 4 weeks  - pt in agreement and wishes to proceed with injection  - RBA of procedure discussed, questions answered - see procedure note  - IVV  informed consent obtained and signed, 10.01.24  - cont PF and Prolensa QID OS for  possible CME/post-op inflammatory component   - Vabysmo  and Eylea  HD approved, but pt owes 20% due to [employer] funding being unavailable  - f/u 4 weeks -- DFE/OCT, possible injection  2. Age related macular degeneration, non-exudative, OD  - The incidence, anatomy, and pathology of dry AMD, risk of progression, and the AREDS and AREDS 2 study including smoking risks discussed with patient.   - recommend Amsler grid monitoring  3. Pigmentary retinopathy OU  - peripheral pigmented CR scarring and perivascular pigment clumping / bone spicules OU  - VA remains fairly good (20/20 OD, 20/40 OS with exudative ARMD OS)  - denies night blindness  - denies family history of vision problems  - discussed findings  - broad differential for pigmentary retinopathy:  Congenital (RP), infectious (TB, lyme dx, syphilis, toxo, bartonella), autoimmune and toxic etiologies  - labs checked (4.2.21):  All essential WNL   Rheumatoid factor   CBC   Comprehensive metabolic panel     RPR   ANA   ESR, CRP   ACE   ANCA   Toxoplasma antibodies IgG / IgM   Quantiferon - TB Gold Plus   HLA, A, B, C (IR)   Fluorescent treponemal ab (fla) - IgG - bld   VDRL Serum   Bartonella antibody, 06.04.21  - [facility] -- drawn on 6.4.21                         Results Positive for AGBL5, ALMS1, OPA1 -- unknown significance              - monitor  4-6. Diabetes mellitus, type 2 without retinopathy  - dx in October 2024 -- A1c 11.9 on 10.16.24  - on Ozempic  and metformin  - The incidence, risk factors for progression, natural history and treatment options for diabetic retinopathy  were discussed with patient.   - The need for close monitoring of blood glucose, blood pressure, and serum lipids, avoiding cigarette or any type of tobacco, and the need for long term follow up was also discussed with patient. - f/u in 1 year, sooner prn  7,8. Hypertensive retinopathy OU  - discussed importance of tight BP  control  - continue to monitor  9. Mixed form age related cataracts OD  - The symptoms of cataract, surgical options, and treatments and risks were discussed with patient.  - discussed diagnosis and progression  - under the expert management of Dr. Waylan  10. Pseudophakia OS  - s/p CE/IOL OS (Dr. Waylan, 10.23.24)  - IOL in good position  - OCT shows mild cystic changes - ?CME / post-op inflammatory component  - cont PF and Prolensa QID OS as above  - monitor  11. Dry eyes OU - recommend artificial tears and lubricating ointment as needed  Ophthalmic Meds Ordered this visit:  Meds ordered this encounter  Medications   aflibercept  (EYLEA  HD) ophthalmic injection 8 mg     Return in about 4 weeks (around 03/17/2024) for exu ARMD OS, DFE, OCT, Possible Injxn.  There are no Patient Instructions on file for this visit.  This document serves as a record of services personally performed by Redell JUDITHANN Hans, MD, PhD. It was created on their behalf by Wanda GEANNIE Keens, COT an ophthalmic technician. The creation of this record is the provider's dictation and/or activities during the visit.  Electronically signed by:  Wanda GEANNIE Keens, COT  02/18/24 7:13 PM  This document serves as a record of services personally performed by Redell JUDITHANN Hans, MD, PhD. It was created on their behalf by Almetta Pesa, an ophthalmic technician. The creation of this record is the provider's dictation and/or activities during the visit.    Electronically signed by: Almetta Pesa, OA, 02/18/24  7:13 PM  Redell JUDITHANN Hans, M.D., Ph.D. Diseases & Surgery of the Retina and Vitreous [facility]  I have reviewed the above documentation for accuracy and completeness, and I agree with the above. Redell JUDITHANN Hans, M.D., Ph.D. 02/18/24 7:15 PM   Abbreviations: M myopia (nearsighted); A astigmatism; H hyperopia (farsighted); P presbyopia; Mrx spectacle prescription;  CTL contact lenses;  OD right eye; OS left eye; OU both eyes  XT exotropia; ET esotropia; PEK punctate epithelial keratitis; PEE punctate epithelial erosions; DES dry eye syndrome; MGD meibomian gland dysfunction; ATs artificial tears; PFAT's preservative free artificial tears; NSC nuclear sclerotic cataract; PSC posterior subcapsular cataract; ERM epi-retinal membrane; PVD posterior vitreous detachment; RD retinal detachment; DM diabetes mellitus; DR diabetic retinopathy; NPDR non-proliferative diabetic retinopathy; PDR proliferative diabetic retinopathy; CSME clinically significant macular edema; DME diabetic macular edema; dbh dot blot hemorrhages; CWS cotton wool spot; POAG primary open angle glaucoma; C/D cup-to-disc ratio; HVF humphrey visual field; GVF goldmann visual field; OCT optical coherence tomography; IOP intraocular pressure; BRVO Branch retinal vein occlusion; CRVO central retinal vein occlusion; CRAO central retinal artery occlusion; BRAO branch retinal artery occlusion; RT retinal tear; SB scleral buckle; PPV pars plana vitrectomy; VH Vitreous hemorrhage; PRP panretinal laser photocoagulation; IVK intravitreal kenalog; VMT vitreomacular traction; MH Macular hole;  NVD neovascularization of the disc; NVE neovascularization elsewhere; AREDS age related eye disease study; ARMD age related macular degeneration; POAG primary open angle glaucoma; EBMD epithelial/anterior basement membrane dystrophy; ACIOL anterior chamber intraocular lens; IOL intraocular lens; PCIOL posterior chamber intraocular lens; Phaco/IOL phacoemulsification with intraocular lens placement; PRK photorefractive keratectomy; LASIK laser assisted in situ keratomileusis; HTN hypertension; DM diabetes mellitus; COPD chronic obstructive pulmonary disease

## 2024-02-18 ENCOUNTER — Encounter (INDEPENDENT_AMBULATORY_CARE_PROVIDER_SITE_OTHER): Payer: Self-pay | Admitting: Ophthalmology

## 2024-02-18 ENCOUNTER — Ambulatory Visit (INDEPENDENT_AMBULATORY_CARE_PROVIDER_SITE_OTHER): Admitting: Ophthalmology

## 2024-02-18 DIAGNOSIS — H25811 Combined forms of age-related cataract, right eye: Secondary | ICD-10-CM

## 2024-02-18 DIAGNOSIS — H353112 Nonexudative age-related macular degeneration, right eye, intermediate dry stage: Secondary | ICD-10-CM

## 2024-02-18 DIAGNOSIS — E119 Type 2 diabetes mellitus without complications: Secondary | ICD-10-CM | POA: Diagnosis not present

## 2024-02-18 DIAGNOSIS — H35033 Hypertensive retinopathy, bilateral: Secondary | ICD-10-CM

## 2024-02-18 DIAGNOSIS — I10 Essential (primary) hypertension: Secondary | ICD-10-CM

## 2024-02-18 DIAGNOSIS — Z7984 Long term (current) use of oral hypoglycemic drugs: Secondary | ICD-10-CM | POA: Diagnosis not present

## 2024-02-18 DIAGNOSIS — Z961 Presence of intraocular lens: Secondary | ICD-10-CM

## 2024-02-18 DIAGNOSIS — H353221 Exudative age-related macular degeneration, left eye, with active choroidal neovascularization: Secondary | ICD-10-CM

## 2024-02-18 DIAGNOSIS — H3552 Pigmentary retinal dystrophy: Secondary | ICD-10-CM | POA: Diagnosis not present

## 2024-02-18 DIAGNOSIS — Z7985 Long-term (current) use of injectable non-insulin antidiabetic drugs: Secondary | ICD-10-CM | POA: Diagnosis not present

## 2024-02-18 DIAGNOSIS — H04123 Dry eye syndrome of bilateral lacrimal glands: Secondary | ICD-10-CM

## 2024-02-18 MED ORDER — AFLIBERCEPT 8 MG/0.07ML IZ SOLN
8.0000 mg | INTRAVITREAL | Status: AC | PRN
  Administered 2024-02-18: 8 mg via INTRAVITREAL

## 2024-02-23 ENCOUNTER — Other Ambulatory Visit: Payer: Self-pay | Admitting: *Deleted

## 2024-02-25 ENCOUNTER — Other Ambulatory Visit (HOSPITAL_COMMUNITY): Payer: Self-pay

## 2024-03-03 NOTE — Progress Notes (Signed)
 [facility] - Clinic Note  03/17/2024    CHIEF COMPLAINT Patient presents for Retina Follow Up  HISTORY OF PRESENT ILLNESS: Eric Key is a 68 y.o. male who presents to the clinic today for:   HPI     Retina Follow Up   Patient presents with  Wet AMD.  In left eye.  This started 4 weeks ago.  I, the attending physician,  performed the HPI with the patient and updated documentation appropriately.        Comments   Patient here for 4 weeks retina follow up for exu ARMD OD. Patient states vision doing normal. No eye pain.       Last edited by [name], MD on 03/17/2024  1:08 PM.    Pt states vision is the same.    Referring physician: [name], Eric Key LABOR, MD [address] [place],  [place] [address]  HISTORICAL INFORMATION:   Selected notes from the MEDICAL RECORD NUMBER Referred by Dr. Deanne Key for concern of mac heme OS.   CURRENT MEDICATIONS: No current outpatient medications on file. (Ophthalmic Drugs)   No current facility-administered medications for this visit. (Ophthalmic Drugs)   Current Outpatient Medications (Other)  Medication Sig   Continuous Glucose Sensor (FREESTYLE LIBRE 3 PLUS SENSOR) MISC CHANGE SENSOR EVERY 15 DAYS   varenicline  (CHANTIX  CONTINUING MONTH PAK) 1 MG tablet Take 1 tablet (1 mg total) by mouth 2 (two) times daily.   No current facility-administered medications for this visit. (Other)   REVIEW OF SYSTEMS: ROS   Positive for: Endocrine, Eyes Negative for: Constitutional, Gastrointestinal, Neurological, Skin, Genitourinary, Musculoskeletal, HENT, Cardiovascular, Respiratory, Psychiatric, Allergic/Imm, Heme/Lymph Last edited by [name], COA on 03/17/2024  7:56 AM.     ALLERGIES Allergies  Allergen Reactions   Penicillins Hives   PAST MEDICAL HISTORY Past Medical History:  Diagnosis Date   Allergy    Penicillin   Cataract    Mixed form OU   History of adenomatous polyps of colon  03/16/2020   Hypertension    past hx    Hypertensive retinopathy    OU   Macular degeneration    Dry OD; Wet OS   Past Surgical History:  Procedure Laterality Date   ACHILLES TENDON REPAIR  2010   CHOLECYSTECTOMY     COLONOSCOPY     EYE SURGERY     GALLBLADDER SURGERY     FAMILY HISTORY Family History  Problem Relation Age of Onset   Diabetes Brother    Heart disease Brother    Diabetes Maternal Grandfather    Colon polyps Neg Hx    Colon cancer Neg Hx    Esophageal cancer Neg Hx    Rectal cancer Neg Hx    Stomach cancer Neg Hx    SOCIAL HISTORY Social History   Tobacco Use   Smoking status: Some Days    Current packs/day: 0.75    Average packs/day: 0.8 packs/day for 41.0 years (30.8 ttl pk-yrs)    Types: Cigars, Cigarettes   Smokeless tobacco: Never  Vaping Use   Vaping status: Never Used  Substance Use Topics   Alcohol use: Not Currently    Comment: occ   Drug use: Not Currently       OPHTHALMIC EXAM: Base Eye Exam     Visual Acuity (Snellen - Linear)       Right Left   Dist cc 20/20 20/30    Correction: Glasses  Tonometry (Tonopen, 7:54 AM)       Right Left   Pressure 12 08         Pupils       Dark Light Shape React APD   Right 3 2 Round Brisk None   Left 3 2 Round Brisk None         Visual Fields (Counting fingers)       Left Right    Full Full         Extraocular Movement       Right Left    Full, Ortho Full, Ortho         Neuro/Psych     Oriented x3: Yes   Mood/Affect: Normal         Dilation     Both eyes: 1.0% Mydriacyl, 2.5% Phenylephrine @ 7:54 AM           Slit Lamp and Fundus Exam     Slit Lamp Exam       Right Left   Lids/Lashes Dermatochalasis - upper lid Dermatochalasis - upper lid   Conjunctiva/Sclera White and quiet White and quiet   Cornea trace PEE, mild Arcus, tear film debris mild Arcus, 2-3+ fine punctate epithelial erosions inferiorly, tear film debris, well healed  cataract wound   Anterior Chamber deep, clear, narrow angles deep, 0.5+cell/pigment   Iris Round and dilated, mild anterior bowing Round and moderately dilated, mild anterior bowing   Lens 2-3+ Nuclear sclerosis, 2-3+ Cortical cataract PC IOL in good position   Anterior Vitreous Vitreous syneresis Vitreous syneresis, fine cell/pigment         Fundus Exam       Right Left   Disc Pink and Sharp, focal PPP ST, Compact Pink and Sharp, Compact   C/D Ratio 0.2 0.4   Macula Flat, Drusen, RPE mottling and clumping, early Atrophy, punctate IRH -- fading abnormal foveal reflex, +CNV with heme stably improved, +central edema / cystic changes -- slightly increased, Drusen, RPE mottling and clumping, mild subretinal fibrosis, no heme   Vessels attenuated, tortuous, perivascular pigment clumping peripherally, mild AV crossing changes attenuated, mild tortuousity, perivascular pigment clumping peripherally   Periphery Attached, peripheral pigmented CR scarring / atrophy and perivascular bone spicules 360, No heme Attached, peripheral pigmented CR scarring / atrophy and perivascular bone spicules 360, No heme           Refraction     Wearing Rx       Sphere Cylinder Axis Add   Right +0.25 +1.50 003 +2.00   Left +0.25 +0.75 169 +2.00           IMAGING AND PROCEDURES  Imaging and Procedures for @TODAY @  OCT, Retina - OU - Both Eyes       Right Eye Quality was borderline. Central Foveal Thickness: 306. Progression has been stable. Findings include normal foveal contour, no IRF, no SRF, retinal drusen , pigment epithelial detachment, outer retinal atrophy (Persistent vitreous opacities; stable low lying PEDs and ORA, partial PVD).   Left Eye Quality was borderline. Central Foveal Thickness: 459. Progression has worsened. Findings include abnormal foveal contour, retinal drusen , subretinal hyper-reflective material, intraretinal fluid, pigment epithelial detachment, outer retinal atrophy  (persistent IRF overlying stable low-lying PED / SRHM temporal fovea and macula--slightly increased).   Notes *Images captured and stored on drive  Diagnosis / Impression:  OD: non-exu ARMD w/ stable low lying PEDs and ORA OS: exu ARMD -- persistent IRF overlying stable low-lying PED / [facility]  temporal fovea and macula -- slightly increased  Clinical management:  See below  Abbreviations: NFP - Normal foveal profile. CME - cystoid macular edema. PED - pigment epithelial detachment. IRF - intraretinal fluid. SRF - subretinal fluid. EZ - ellipsoid zone. ERM - epiretinal membrane. ORA - outer retinal atrophy. ORT - outer retinal tubulation. SRHM - subretinal hyper-reflective material      Intravitreal Injection, Pharmacologic Agent - OS - Left Eye       Time Out 03/17/2024. 8:03 AM. Confirmed correct patient, procedure, site, and patient consented.   Anesthesia Topical anesthesia was used. Anesthetic medications included Lidocaine  2%, Proparacaine 0.5%.   Procedure Preparation included 5% betadine to ocular surface, eyelid speculum. A (32g) needle was used.   Injection: 6 mg faricimab -svoa 6 MG/0.05ML (Patient supplied)   Route: Intravitreal, Site: Left Eye   NDC: 49757-903-98, Lot: [id number], Expiration date: 01/13/2026, Waste: 0 mL   Post-op Post injection exam found visual acuity of at least counting fingers. The patient tolerated the procedure well. There were no complications. The patient received written and verbal post procedure care education. Post injection medications were not given.   Notes **SAMPLE MEDICATION ADMINISTERED**           ASSESSMENT/PLAN:   ICD-10-CM   1. Exudative age-related macular degeneration of left eye with active choroidal neovascularization (HCC)  H35.3221 OCT, Retina - OU - Both Eyes    Intravitreal Injection, Pharmacologic Agent - OS - Left Eye    faricimab -svoa (VABYSMO ) 6mg /0.26mL intravitreal injection    2. 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 Diabetes mellitus type 2 without retinopathy (HCC)  E11.9     5. Long term (current) use of oral hypoglycemic drugs  Z79.84     6. Long-term (current) use of injectable non-insulin antidiabetic drugs  Z79.85     7. Essential hypertension  I10     8. Hypertensive retinopathy of both eyes  H35.033     9. Combined forms of age-related cataract of right eye  H25.811     10. Pseudophakia  Z96.1     11. Dry eyes  H04.123      1. 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, OS  - s/p IVA OS #1 (03.04.21), #2 (03.04.21), #3 (04.30.21), #4 (01.07.25)  - s/p IVE OS #1 (06.04.21), #2 (07.08.21), #3 (08.09.21), #4 (09.13.21), #5 (10.18.21), #6 (11.24.21), #7 (12.31.21), #8 (02.11.22), #9 (03.25.22), #10 (05.02.22), #11 (06.06.22), #12 (07.13.22), #13 (08.17.22), #14 (09.21.22), #15 (10.26.22), #16 (11.29.22), #17 (01.03.23), #18 (02.07.23), #19 (03.16.23), #20 (04.20.23), #21 (05.19.23), #22 (06.20.23) -- IVE resistance  - s/p IVV OS #1 (07.18.23), #2 (08.15.23), #3 (09.12.23), #4 (10.10.23), #5 (11.14.23), #6 (12.21.23), #7 (01.25.24), #8 (02.29.24), #9 (04.11.24), #10 (05.20.24), #11 (07.01.24), #12 (08.13.24), #13 (10.01.24), #14 (11.26.24), 15 (sample 02.04.25), #16 (sample 03.11.25), #17 (04.08.25), #18 (sample, 05.08.25), #19 (sample, 06.05.25), #20 (sample, 07.03.25), #21 (sample, 07.31.25)  - s/p IVE HD OS #1 (09.04.25--sample), #2 (sample 10.02.25), #3 (sample 11.04.25)  - exam shows central CNV with +subretinal heme -- stably improved  - BCVA OS 20/30 stable  - OCT shows persistent IRF overlying stable low-lying PED / SRHM temporal fovea and macula -- slightly increased at 4 weeks  - recommend going to back to IVV OS #22 [sample] today (12.02.25) with follow up in 5-6 weeks  - pt in agreement and wishes to proceed with injection  - RBA of procedure discussed,  questions  answered - see procedure note  - IVV informed consent obtained and signed, 10.01.24  - cont PF and Prolensa QID OS for possible CME/post-op inflammatory component   - Vabysmo  and Eylea  HD approved, but pt owes 20% due to [employer] funding being unavailable  - f/u 4 weeks -- DFE/OCT, possible injection  2. Age related macular degeneration, non-exudative, OD  - The incidence, anatomy, and pathology of dry AMD, risk of progression, and the AREDS and AREDS 2 study including smoking risks discussed with patient.   - recommend Amsler grid monitoring  3. Pigmentary retinopathy OU  - peripheral pigmented CR scarring and perivascular pigment clumping / bone spicules OU  - VA remains fairly good (20/20 OD, 20/40 OS with exudative ARMD OS)  - denies night blindness  - denies family history of vision problems  - discussed findings  - broad differential for pigmentary retinopathy:  Congenital (RP), infectious (TB, lyme dx, syphilis, toxo, bartonella), autoimmune and toxic etiologies  - labs checked (4.2.21):  All essential WNL   Rheumatoid factor   CBC   Comprehensive metabolic panel     RPR   ANA   ESR, CRP   ACE   ANCA   Toxoplasma antibodies IgG / IgM   Quantiferon - TB Gold Plus   HLA, A, B, C (IR)   Fluorescent treponemal ab (fla) - IgG - bld   VDRL Serum   Bartonella antibody, 06.04.21  - [facility] -- drawn on 6.4.21                         Results Positive for AGBL5, ALMS1, OPA1 -- unknown significance              - monitor  4-6. Diabetes mellitus, type 2 without retinopathy  - dx in October 2024 -- A1c 11.9 on 10.16.24  - on Ozempic  and metformin  - The incidence, risk factors for progression, natural history and treatment options for diabetic retinopathy  were discussed with patient.   - The need for close monitoring of blood glucose, blood pressure, and serum lipids, avoiding cigarette or any type of tobacco, and the need for long term follow up was also discussed  with patient. - f/u in 1 year, sooner prn  7,8. Hypertensive retinopathy OU  - discussed importance of tight BP control  - continue to monitor  9. Mixed form age related cataracts OD  - The symptoms of cataract, surgical options, and treatments and risks were discussed with patient.  - discussed diagnosis and progression  - under the expert management of Dr. Waylan  10. Pseudophakia OS  - s/p CE/IOL OS (Dr. Waylan, 10.23.24)  - IOL in good position  - OCT shows mild cystic changes - ?CME / post-op inflammatory component  - cont PF and Prolensa QID OS as above  - monitor  11. Dry eyes OU - recommend artificial tears and lubricating ointment as needed  Ophthalmic Meds Ordered this visit:  Meds ordered this encounter  Medications   faricimab -svoa (VABYSMO ) 6mg /0.58mL intravitreal injection     Return for 5-6 weeks exu ARMD OS, DFE, OCT, Possible Injxn.  There are no Patient Instructions on file for this visit.  This document serves as a record of services personally performed by Redell JUDITHANN Hans, MD, PhD. It was created on their behalf by Wanda GEANNIE Keens, COT an ophthalmic technician. The creation of this record is the provider's dictation and/or activities during the visit.  Electronically signed by:  Wanda GEANNIE Keens, COT  03/17/24 1:10 PM  This document serves as a record of services personally performed by Redell JUDITHANN Hans, MD, PhD. It was created on their behalf by Almetta Pesa, an ophthalmic technician. The creation of this record is the provider's dictation and/or activities during the visit.    Electronically signed by: Almetta Pesa, OA, 03/17/24  1:10 PM  Redell JUDITHANN Hans, M.D., Ph.D. Diseases & Surgery of the Retina and Vitreous [facility]  I have reviewed the above documentation for accuracy and completeness, and I agree with the above. Redell JUDITHANN Hans, M.D., Ph.D. 03/17/24 1:13 PM   Abbreviations: M myopia (nearsighted); A  astigmatism; H hyperopia (farsighted); P presbyopia; Mrx spectacle prescription;  CTL contact lenses; OD right eye; OS left eye; OU both eyes  XT exotropia; ET esotropia; PEK punctate epithelial keratitis; PEE punctate epithelial erosions; DES dry eye syndrome; MGD meibomian gland dysfunction; ATs artificial tears; PFAT's preservative free artificial tears; NSC nuclear sclerotic cataract; PSC posterior subcapsular cataract; ERM epi-retinal membrane; PVD posterior vitreous detachment; RD retinal detachment; DM diabetes mellitus; DR diabetic retinopathy; NPDR non-proliferative diabetic retinopathy; PDR proliferative diabetic retinopathy; CSME clinically significant macular edema; DME diabetic macular edema; dbh dot blot hemorrhages; CWS cotton wool spot; POAG primary open angle glaucoma; C/D cup-to-disc ratio; HVF humphrey visual field; GVF goldmann visual field; OCT optical coherence tomography; IOP intraocular pressure; BRVO Branch retinal vein occlusion; CRVO central retinal vein occlusion; CRAO central retinal artery occlusion; BRAO branch retinal artery occlusion; RT retinal tear; SB scleral buckle; PPV pars plana vitrectomy; VH Vitreous hemorrhage; PRP panretinal laser photocoagulation; IVK intravitreal kenalog; VMT vitreomacular traction; MH Macular hole;  NVD neovascularization of the disc; NVE neovascularization elsewhere; AREDS age related eye disease study; ARMD age related macular degeneration; POAG primary open angle glaucoma; EBMD epithelial/anterior basement membrane dystrophy; ACIOL anterior chamber intraocular lens; IOL intraocular lens; PCIOL posterior chamber intraocular lens; Phaco/IOL phacoemulsification with intraocular lens placement; PRK photorefractive keratectomy; LASIK laser assisted in situ keratomileusis; HTN hypertension; DM diabetes mellitus; COPD chronic obstructive pulmonary disease

## 2024-03-17 ENCOUNTER — Ambulatory Visit (INDEPENDENT_AMBULATORY_CARE_PROVIDER_SITE_OTHER): Admitting: Ophthalmology

## 2024-03-17 ENCOUNTER — Encounter (INDEPENDENT_AMBULATORY_CARE_PROVIDER_SITE_OTHER): Payer: Self-pay | Admitting: Ophthalmology

## 2024-03-17 DIAGNOSIS — Z7984 Long term (current) use of oral hypoglycemic drugs: Secondary | ICD-10-CM | POA: Diagnosis not present

## 2024-03-17 DIAGNOSIS — H25811 Combined forms of age-related cataract, right eye: Secondary | ICD-10-CM | POA: Diagnosis not present

## 2024-03-17 DIAGNOSIS — Z961 Presence of intraocular lens: Secondary | ICD-10-CM

## 2024-03-17 DIAGNOSIS — H353221 Exudative age-related macular degeneration, left eye, with active choroidal neovascularization: Secondary | ICD-10-CM

## 2024-03-17 DIAGNOSIS — E119 Type 2 diabetes mellitus without complications: Secondary | ICD-10-CM | POA: Diagnosis not present

## 2024-03-17 DIAGNOSIS — H35033 Hypertensive retinopathy, bilateral: Secondary | ICD-10-CM | POA: Diagnosis not present

## 2024-03-17 DIAGNOSIS — H353112 Nonexudative age-related macular degeneration, right eye, intermediate dry stage: Secondary | ICD-10-CM | POA: Diagnosis not present

## 2024-03-17 DIAGNOSIS — H3552 Pigmentary retinal dystrophy: Secondary | ICD-10-CM | POA: Diagnosis not present

## 2024-03-17 DIAGNOSIS — Z7985 Long-term (current) use of injectable non-insulin antidiabetic drugs: Secondary | ICD-10-CM | POA: Diagnosis not present

## 2024-03-17 DIAGNOSIS — H04123 Dry eye syndrome of bilateral lacrimal glands: Secondary | ICD-10-CM

## 2024-03-17 DIAGNOSIS — I10 Essential (primary) hypertension: Secondary | ICD-10-CM

## 2024-03-17 MED ORDER — FARICIMAB-SVOA 6 MG/0.05ML IZ SOLN
6.0000 mg | INTRAVITREAL | Status: AC | PRN
  Administered 2024-03-17: 6 mg via INTRAVITREAL

## 2024-04-02 ENCOUNTER — Encounter: Payer: Self-pay | Admitting: Acute Care

## 2024-04-17 NOTE — Progress Notes (Signed)
 " [facility] - Clinic Note  04/29/2024    CHIEF COMPLAINT Patient presents for Retina Follow Up  HISTORY OF PRESENT ILLNESS: Eric Key is a 69 y.o. male who presents to the clinic today for:   HPI     Retina Follow Up   Patient presents with  Wet AMD.  In left eye.  This started 6 weeks ago.  I, the attending physician,  performed the HPI with the patient and updated documentation appropriately.        Comments   Patient here for 6 weeks retina follow up for exu ARMD OD. Pt denies any changes in [place]. Pt denies FOL/floaters.  BSL: Unknown  A1C: 5.7       Last edited by [name], MD on 04/29/2024  1:36 PM.    Pt states vision is the same.    Referring physician: [name], Fonda LABOR, MD [address] [place],  [place] [address]  HISTORICAL INFORMATION:   Selected notes from the MEDICAL RECORD NUMBER Referred by Dr. Deanne Mate for concern of mac heme OS.   CURRENT MEDICATIONS: No current outpatient medications on file. (Ophthalmic Drugs)   No current facility-administered medications for this visit. (Ophthalmic Drugs)   Current Outpatient Medications (Other)  Medication Sig   Continuous Glucose Sensor (FREESTYLE LIBRE 3 PLUS SENSOR) MISC CHANGE SENSOR EVERY 15 DAYS   varenicline  (CHANTIX  CONTINUING MONTH PAK) 1 MG tablet Take 1 tablet (1 mg total) by mouth 2 (two) times daily.   No current facility-administered medications for this visit. (Other)   REVIEW OF SYSTEMS:   ALLERGIES Allergies  Allergen Reactions   Penicillins Hives   PAST MEDICAL HISTORY Past Medical History:  Diagnosis Date   Allergy    Penicillin   Cataract    Mixed form OU   History of adenomatous polyps of colon 03/16/2020   Hypertension    past hx    Hypertensive retinopathy    OU   Macular degeneration    Dry OD; Wet OS   Past Surgical History:  Procedure Laterality Date   ACHILLES TENDON REPAIR  2010   CHOLECYSTECTOMY     COLONOSCOPY     EYE  SURGERY     GALLBLADDER SURGERY     FAMILY HISTORY Family History  Problem Relation Age of Onset   Diabetes Brother    Heart disease Brother    Diabetes Maternal Grandfather    Colon polyps Neg Hx    Colon cancer Neg Hx    Esophageal cancer Neg Hx    Rectal cancer Neg Hx    Stomach cancer Neg Hx    SOCIAL HISTORY Social History   Tobacco Use   Smoking status: Some Days    Current packs/day: 0.75    Average packs/day: 0.8 packs/day for 41.0 years (30.8 ttl pk-yrs)    Types: Cigars, Cigarettes   Smokeless tobacco: Never  Vaping Use   Vaping status: Never Used  Substance Use Topics   Alcohol use: Not Currently    Comment: occ   Drug use: Not Currently       OPHTHALMIC EXAM: Base Eye Exam     Visual Acuity (Snellen - Linear)       Right Left   Dist cc 20/20 20/25 -2    Correction: Glasses         Tonometry (Tonopen, 7:50 AM)       Right Left   Pressure 17 13  Pupils       Pupils Dark Light Shape React APD   Right PERRL 3 2 Round Brisk None   Left PERRL 3 2 Round Brisk None         Visual Fields       Left Right    Full Full         Extraocular Movement       Right Left    Full, Ortho Full, Ortho         Neuro/Psych     Oriented x3: Yes   Mood/Affect: Normal         Dilation     Both eyes: 1.0% Mydriacyl, 2.5% Phenylephrine @ 7:51 AM           Slit Lamp and Fundus Exam     Slit Lamp Exam       Right Left   Lids/Lashes Dermatochalasis - upper lid Dermatochalasis - upper lid   Conjunctiva/Sclera White and quiet White and quiet   Cornea trace PEE, mild Arcus, tear film debris mild Arcus, 2-3+ fine punctate epithelial erosions inferiorly, tear film debris, well healed cataract wound   Anterior Chamber deep, clear, narrow angles deep, 0.5+cell/pigment   Iris Round and dilated, mild anterior bowing Round and moderately dilated, mild anterior bowing   Lens 2-3+ Nuclear sclerosis, 2-3+ Cortical cataract PC IOL in good  position   Anterior Vitreous Vitreous syneresis Vitreous syneresis, fine cell/pigment         Fundus Exam       Right Left   Disc Pink and Sharp, focal PPP ST, Compact Pink and Sharp, Compact   C/D Ratio 0.2 0.4   Macula Flat, Drusen, RPE mottling and clumping, early Atrophy, punctate IRH -- fading abnormal foveal reflex, +CNV with heme stably improved, +central edema / cystic changes -- persistent, Drusen, RPE mottling and clumping, mild subretinal fibrosis, no heme   Vessels attenuated, tortuous, perivascular pigment clumping peripherally, mild AV crossing changes attenuated, mild tortuousity, perivascular pigment clumping peripherally   Periphery Attached, peripheral pigmented CR scarring / atrophy and perivascular bone spicules 360, No heme Attached, peripheral pigmented CR scarring / atrophy and perivascular bone spicules 360, No heme           Refraction     Wearing Rx       Sphere Cylinder Axis Add   Right +0.25 +1.50 003 +2.00   Left +0.25 +0.75 169 +2.00           IMAGING AND PROCEDURES  Imaging and Procedures for @TODAY @  OCT, Retina - OU - Both Eyes       Right Eye Quality was borderline. Central Foveal Thickness: 305. Progression has been stable. Findings include normal foveal contour, no IRF, no SRF, retinal drusen , pigment epithelial detachment, outer retinal atrophy (Persistent vitreous opacities; stable low lying PEDs and ORA, partial PVD).   Left Eye Quality was borderline. Central Foveal Thickness: 463. Progression has been stable. Findings include abnormal foveal contour, retinal drusen , subretinal hyper-reflective material, intraretinal fluid, pigment epithelial detachment, outer retinal atrophy (persistent IRF overlying stable low-lying PED / SRHM temporal fovea and macula).   Notes *Images captured and stored on drive  Diagnosis / Impression:  OD: non-exu ARMD w/ stable low lying PEDs and ORA OS: exu ARMD -- persistent IRF overlying stable  low-lying PED / SRHM temporal fovea and macula  Clinical management:  See below  Abbreviations: NFP - Normal foveal profile. CME - cystoid macular edema. PED - pigment  epithelial detachment. IRF - intraretinal fluid. SRF - subretinal fluid. EZ - ellipsoid zone. ERM - epiretinal membrane. ORA - outer retinal atrophy. ORT - outer retinal tubulation. SRHM - subretinal hyper-reflective material      Intravitreal Injection, Pharmacologic Agent - OS - Left Eye       Time Out 04/29/2024. 8:01 AM. Confirmed correct patient, procedure, site, and patient consented.   Anesthesia Topical anesthesia was used. Anesthetic medications included Lidocaine  2%, Proparacaine 0.5%.   Procedure Preparation included 5% betadine to ocular surface, eyelid speculum. A (32g) needle was used.   Injection: 6 mg faricimab -svoa 6 MG/0.05ML (Patient supplied)   Route: Intravitreal, Site: Left Eye   NDC: 49757-903-98, Lot: [id number], Expiration date: 01/13/2026, Waste: 0 mL   Post-op Post injection exam found visual acuity of at least counting fingers. The patient tolerated the procedure well. There were no complications. The patient received written and verbal post procedure care education. Post injection medications were not given.   Notes **SAMPLE MEDICATION ADMINISTERED**           ASSESSMENT/PLAN:   ICD-10-CM   1. Exudative age-related macular degeneration of left eye with active choroidal neovascularization (HCC)  H35.3221 OCT, Retina - OU - Both Eyes    Intravitreal Injection, Pharmacologic Agent - OS - Left Eye    faricimab -svoa (VABYSMO ) 6mg /0.81mL intravitreal injection    2. 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 Diabetes mellitus type 2 without retinopathy (HCC)  E11.9     5. Long term (current) use of oral hypoglycemic drugs  Z79.84     6. Long-term (current) use of injectable non-insulin  antidiabetic drugs  Z79.85     7. Essential hypertension  I10     8. Hypertensive retinopathy of both eyes  H35.033     9. Combined forms of age-related cataract of right eye  H25.811     10. Pseudophakia  Z96.1     11. Dry eyes  H04.123      1. 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, OS  - s/p IVA OS #1 (03.04.21), #2 (03.04.21), #3 (04.30.21), #4 (01.07.25)  - s/p IVE OS #1 (06.04.21), #2 (07.08.21), #3 (08.09.21), #4 (09.13.21), #5 (10.18.21), #6 (11.24.21), #7 (12.31.21), #8 (02.11.22), #9 (03.25.22), #10 (05.02.22), #11 (06.06.22), #12 (07.13.22), #13 (08.17.22), #14 (09.21.22), #15 (10.26.22), #16 (11.29.22), #17 (01.03.23), #18 (02.07.23), #19 (03.16.23), #20 (04.20.23), #21 (05.19.23), #22 (06.20.23) -- IVE resistance  - s/p IVV OS #1 (07.18.23), #2 (08.15.23), #3 (09.12.23), #4 (10.10.23), #5 (11.14.23), #6 (12.21.23), #7 (01.25.24), #8 (02.29.24), #9 (04.11.24), #10 (05.20.24), #11 (07.01.24), #12 (08.13.24), #13 (10.01.24), #14 (11.26.24), 15 (sample 02.04.25), #16 (sample 03.11.25), #17 (04.08.25), #18 (sample, 05.08.25), #19 (sample, 06.05.25), #20 (sample, 07.03.25), #21 (sample, 07.31.25), #22 (12.02.25)  - s/p IVE HD OS #1 (09.04.25--sample), #2 (sample 10.02.25), #3 (sample 11.04.25)  - exam shows central CNV with +subretinal heme -- stably improved  - BCVA OS 20/25 from 20/30  - OCT shows persistent IRF overlying stable low-lying PED / [facility] temporal fovea and macula at 6 weeks  - recommend IVV OS #23 [sample] today (01.14.26) with follow up in 5 weeks  - pt in agreement and wishes to proceed with injection  - RBA of procedure discussed, questions answered - see procedure note  - IVV informed consent obtained and signed, 10.01.24  - cont PF and Prolensa QID OS for possible CME/post-op inflammatory component   - Vabysmo   and Eylea  HD approved, but pt owes 20% due to [employer] funding being unavailable  - f/u 5 weeks -- DFE/OCT, possible injection  2. Age related  macular degeneration, non-exudative, OD  - The incidence, anatomy, and pathology of dry AMD, risk of progression, and the AREDS and AREDS 2 study including smoking risks discussed with patient.   - recommend Amsler grid monitoring  3. Pigmentary retinopathy OU  - peripheral pigmented CR scarring and perivascular pigment clumping / bone spicules OU  - VA remains fairly good (20/20 OD, 20/40 OS with exudative ARMD OS)  - denies night blindness  - denies family history of vision problems  - discussed findings  - broad differential for pigmentary retinopathy:  Congenital (RP), infectious (TB, lyme dx, syphilis, toxo, bartonella), autoimmune and toxic etiologies  - labs checked (4.2.21):  All essential WNL   Rheumatoid factor   CBC   Comprehensive metabolic panel     RPR   ANA   ESR, CRP   ACE   ANCA   Toxoplasma antibodies IgG / IgM   Quantiferon - TB Gold Plus   HLA, A, B, C (IR)   Fluorescent treponemal ab (fla) - IgG - bld   VDRL Serum   Bartonella antibody, 06.04.21  - [facility] -- drawn on 6.4.21                         Results Positive for AGBL5, ALMS1, OPA1 -- unknown significance              - monitor  4-6. Diabetes mellitus, type 2 without retinopathy  - dx in October 2024 -- A1c 11.9 on 10.16.24  - on Ozempic  and metformin  - The incidence, risk factors for progression, natural history and treatment options for diabetic retinopathy  were discussed with patient.   - The need for close monitoring of blood glucose, blood pressure, and serum lipids, avoiding cigarette or any type of tobacco, and the need for long term follow up was also discussed with patient. - f/u in 1 year, sooner prn  7,8. Hypertensive retinopathy OU  - discussed importance of tight BP control  - continue to monitor  9. Mixed form age related cataracts OD  - The symptoms of cataract, surgical options, and treatments and risks were discussed with patient.  - discussed diagnosis and  progression  - under the expert management of Dr. Waylan  10. Pseudophakia OS  - s/p CE/IOL OS (Dr. Waylan, 10.23.24)  - IOL in good position  - OCT shows mild cystic changes - ?CME / post-op inflammatory component  - cont PF and Prolensa QID OS as above  - monitor  11. Dry eyes OU - recommend artificial tears and lubricating ointment as needed  Ophthalmic Meds Ordered this visit:  Meds ordered this encounter  Medications   faricimab -svoa (VABYSMO ) 6mg /0.77mL intravitreal injection     Return in about 5 weeks (around 06/03/2024) for exu ARMD OS, DFE, OCT, likely IVV OS sample.  There are no Patient Instructions on file for this visit.   This document serves as a record of services personally performed by Redell JUDITHANN Hans, MD, PhD. It was created on their behalf by Almetta Pesa, an ophthalmic technician. The creation of this record is the provider's dictation and/or activities during the visit.    Electronically signed by: Almetta Pesa, OA, 05/10/24  11:19 AM  Redell JUDITHANN Hans, M.D., Ph.D. Diseases & Surgery of the Retina and Vitreous  [facility]  I have reviewed the above documentation for accuracy and completeness, and I agree with the above. Redell JUDITHANN Hans, M.D., Ph.D. 05/10/24 11:21 AM    Abbreviations: M myopia (nearsighted); A astigmatism; H hyperopia (farsighted); P presbyopia; Mrx spectacle prescription;  CTL contact lenses; OD right eye; OS left eye; OU both eyes  XT exotropia; ET esotropia; PEK punctate epithelial keratitis; PEE punctate epithelial erosions; DES dry eye syndrome; MGD meibomian gland dysfunction; ATs artificial tears; PFAT's preservative free artificial tears; NSC nuclear sclerotic cataract; PSC posterior subcapsular cataract; ERM epi-retinal membrane; PVD posterior vitreous detachment; RD retinal detachment; DM diabetes mellitus; DR diabetic retinopathy; NPDR non-proliferative diabetic retinopathy; PDR proliferative diabetic  retinopathy; CSME clinically significant macular edema; DME diabetic macular edema; dbh dot blot hemorrhages; CWS cotton wool spot; POAG primary open angle glaucoma; C/D cup-to-disc ratio; HVF humphrey visual field; GVF goldmann visual field; OCT optical coherence tomography; IOP intraocular pressure; BRVO Branch retinal vein occlusion; CRVO central retinal vein occlusion; CRAO central retinal artery occlusion; BRAO branch retinal artery occlusion; RT retinal tear; SB scleral buckle; PPV pars plana vitrectomy; VH Vitreous hemorrhage; PRP panretinal laser photocoagulation; IVK intravitreal kenalog; VMT vitreomacular traction; MH Macular hole;  NVD neovascularization of the disc; NVE neovascularization elsewhere; AREDS age related eye disease study; ARMD age related macular degeneration; POAG primary open angle glaucoma; EBMD epithelial/anterior basement membrane dystrophy; ACIOL anterior chamber intraocular lens; IOL intraocular lens; PCIOL posterior chamber intraocular lens; Phaco/IOL phacoemulsification with intraocular lens placement; PRK photorefractive keratectomy; LASIK laser assisted in situ keratomileusis; HTN hypertension; DM diabetes mellitus; COPD chronic obstructive pulmonary disease "

## 2024-04-29 ENCOUNTER — Encounter (INDEPENDENT_AMBULATORY_CARE_PROVIDER_SITE_OTHER): Payer: Self-pay | Admitting: Ophthalmology

## 2024-04-29 ENCOUNTER — Ambulatory Visit (INDEPENDENT_AMBULATORY_CARE_PROVIDER_SITE_OTHER): Admitting: Ophthalmology

## 2024-04-29 DIAGNOSIS — E119 Type 2 diabetes mellitus without complications: Secondary | ICD-10-CM

## 2024-04-29 DIAGNOSIS — Z7985 Long-term (current) use of injectable non-insulin antidiabetic drugs: Secondary | ICD-10-CM | POA: Diagnosis not present

## 2024-04-29 DIAGNOSIS — Z961 Presence of intraocular lens: Secondary | ICD-10-CM

## 2024-04-29 DIAGNOSIS — H3552 Pigmentary retinal dystrophy: Secondary | ICD-10-CM | POA: Diagnosis not present

## 2024-04-29 DIAGNOSIS — I10 Essential (primary) hypertension: Secondary | ICD-10-CM

## 2024-04-29 DIAGNOSIS — H35033 Hypertensive retinopathy, bilateral: Secondary | ICD-10-CM | POA: Diagnosis not present

## 2024-04-29 DIAGNOSIS — H353112 Nonexudative age-related macular degeneration, right eye, intermediate dry stage: Secondary | ICD-10-CM

## 2024-04-29 DIAGNOSIS — H04123 Dry eye syndrome of bilateral lacrimal glands: Secondary | ICD-10-CM | POA: Diagnosis not present

## 2024-04-29 DIAGNOSIS — Z7984 Long term (current) use of oral hypoglycemic drugs: Secondary | ICD-10-CM | POA: Diagnosis not present

## 2024-04-29 DIAGNOSIS — H25811 Combined forms of age-related cataract, right eye: Secondary | ICD-10-CM | POA: Diagnosis not present

## 2024-04-29 DIAGNOSIS — H353221 Exudative age-related macular degeneration, left eye, with active choroidal neovascularization: Secondary | ICD-10-CM | POA: Diagnosis not present

## 2024-04-29 MED ORDER — FARICIMAB-SVOA 6 MG/0.05ML IZ SOLN
6.0000 mg | INTRAVITREAL | Status: AC | PRN
  Administered 2024-04-29: 6 mg via INTRAVITREAL

## 2024-05-21 NOTE — Progress Notes (Shared)
 " [facility] - Clinic Note  06/03/2024    CHIEF COMPLAINT Patient presents for No chief complaint on file.  HISTORY OF PRESENT ILLNESS: Eric Key is a 69 y.o. male who presents to the clinic today for:    Pt states vision is the same.    Referring physician: [name], Fonda LABOR, MD [address] [place],  [place] [address]  HISTORICAL INFORMATION:   Selected notes from the MEDICAL RECORD NUMBER Referred by Dr. Deanne Mate for concern of mac heme OS.   CURRENT MEDICATIONS: No current outpatient medications on file. (Ophthalmic Drugs)   No current facility-administered medications for this visit. (Ophthalmic Drugs)   Current Outpatient Medications (Other)  Medication Sig   Continuous Glucose Sensor (FREESTYLE LIBRE 3 PLUS SENSOR) MISC CHANGE SENSOR EVERY 15 DAYS   varenicline  (CHANTIX  CONTINUING MONTH PAK) 1 MG tablet Take 1 tablet (1 mg total) by mouth 2 (two) times daily.   No current facility-administered medications for this visit. (Other)   REVIEW OF SYSTEMS:   ALLERGIES Allergies  Allergen Reactions   Penicillins Hives   PAST MEDICAL HISTORY Past Medical History:  Diagnosis Date   Allergy    Penicillin   Cataract    Mixed form OU   History of adenomatous polyps of colon 03/16/2020   Hypertension    past hx    Hypertensive retinopathy    OU   Macular degeneration    Dry OD; Wet OS   Past Surgical History:  Procedure Laterality Date   ACHILLES TENDON REPAIR  2010   CHOLECYSTECTOMY     COLONOSCOPY     EYE SURGERY     GALLBLADDER SURGERY     FAMILY HISTORY Family History  Problem Relation Age of Onset   Diabetes Brother    Heart disease Brother    Diabetes Maternal Grandfather    Colon polyps Neg Hx    Colon cancer Neg Hx    Esophageal cancer Neg Hx    Rectal cancer Neg Hx    Stomach cancer Neg Hx    SOCIAL HISTORY Social History   Tobacco Use   Smoking status: Some Days    Current packs/day: 0.75    Average  packs/day: 0.8 packs/day for 41.0 years (30.8 ttl pk-yrs)    Types: Cigars, Cigarettes   Smokeless tobacco: Never  Vaping Use   Vaping status: Never Used  Substance Use Topics   Alcohol use: Not Currently    Comment: occ   Drug use: Not Currently       OPHTHALMIC EXAM: Not recorded    IMAGING AND PROCEDURES  Imaging and Procedures for @TODAY @          ASSESSMENT/PLAN: No diagnosis found.  1. 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, OS  - s/p IVA OS #1 (03.04.21), #2 (03.04.21), #3 (04.30.21), #4 (01.07.25)  - s/p IVE OS #1 (06.04.21), #2 (07.08.21), #3 (08.09.21), #4 (09.13.21), #5 (10.18.21), #6 (11.24.21), #7 (12.31.21), #8 (02.11.22), #9 (03.25.22), #10 (05.02.22), #11 (06.06.22), #12 (07.13.22), #13 (08.17.22), #14 (09.21.22), #15 (10.26.22), #16 (11.29.22), #17 (01.03.23), #18 (02.07.23), #19 (03.16.23), #20 (04.20.23), #21 (05.19.23), #22 (06.20.23) -- IVE resistance  - s/p IVV OS #1 (07.18.23), #2 (08.15.23), #3 (09.12.23), #4 (10.10.23), #5 (11.14.23), #6 (12.21.23), #7 (01.25.24), #8 (02.29.24), #9 (04.11.24), #10 (05.20.24), #11 (07.01.24), #12 (08.13.24), #13 (10.01.24), #14 (11.26.24), 15 (sample 02.04.25), #16 (sample 03.11.25), #17 (04.08.25), #18 (sample, 05.08.25), #19 (sample, 06.05.25), #20 (sample, 07.03.25), #21 (sample, 07.31.25), #22 (12.02.25), #23 (01.14.26)  -  s/p IVE HD OS #1 (09.04.25--sample), #2 (sample 10.02.25), #3 (sample 11.04.25)  - exam shows central CNV with +subretinal heme -- stably improved  - BCVA OS 20/25 from 20/30  - OCT shows persistent IRF overlying stable low-lying PED / [facility] temporal fovea and macula at 6 weeks  - recommend IVV OS #24 [sample] today (02.18.26) with follow up in 5 weeks  - pt in agreement and wishes to proceed with injection  - RBA of procedure discussed, questions answered - see procedure note  - IVV informed consent obtained and signed, 10.01.24  - cont PF and Prolensa QID OS for possible CME/post-op  inflammatory component   - Vabysmo  and Eylea  HD approved, but pt owes 20% due to [employer] funding being unavailable  - f/u 5 weeks -- DFE/OCT, possible injection  2. Age related macular degeneration, non-exudative, OD  - The incidence, anatomy, and pathology of dry AMD, risk of progression, and the AREDS and AREDS 2 study including smoking risks discussed with patient.   - recommend Amsler grid monitoring  3. Pigmentary retinopathy OU  - peripheral pigmented CR scarring and perivascular pigment clumping / bone spicules OU  - VA remains fairly good (20/20 OD, 20/40 OS with exudative ARMD OS)  - denies night blindness  - denies family history of vision problems  - discussed findings  - broad differential for pigmentary retinopathy:  Congenital (RP), infectious (TB, lyme dx, syphilis, toxo, bartonella), autoimmune and toxic etiologies  - labs checked (4.2.21):  All essential WNL   Rheumatoid factor   CBC   Comprehensive metabolic panel     RPR   ANA   ESR, CRP   ACE   ANCA   Toxoplasma antibodies IgG / IgM   Quantiferon - TB Gold Plus   HLA, A, B, C (IR)   Fluorescent treponemal ab (fla) - IgG - bld   VDRL Serum   Bartonella antibody, 06.04.21  - [facility] -- drawn on 6.4.21                         Results Positive for AGBL5, ALMS1, OPA1 -- unknown significance              - monitor  4-6. Diabetes mellitus, type 2 without retinopathy  - dx in October 2024 -- A1c 11.9 on 10.16.24  - on Ozempic  and metformin  - The incidence, risk factors for progression, natural history and treatment options for diabetic retinopathy  were discussed with patient.   - The need for close monitoring of blood glucose, blood pressure, and serum lipids, avoiding cigarette or any type of tobacco, and the need for long term follow up was also discussed with patient. - f/u in 1 year, sooner prn  7,8. Hypertensive retinopathy OU  - discussed importance of tight BP control  - continue to  monitor  9. Mixed form age related cataracts OD  - The symptoms of cataract, surgical options, and treatments and risks were discussed with patient.  - discussed diagnosis and progression  - under the expert management of Dr. Waylan  10. Pseudophakia OS  - s/p CE/IOL OS (Dr. Waylan, 10.23.24)  - IOL in good position  - OCT shows mild cystic changes - ?CME / post-op inflammatory component  - cont PF and Prolensa QID OS as above  - monitor  11. Dry eyes OU - recommend artificial tears and lubricating ointment as needed  Ophthalmic Meds Ordered this visit:  No orders  of the defined types were placed in this encounter.    No follow-ups on file.  There are no Patient Instructions on file for this visit.   This document serves as a record of services personally performed by Redell JUDITHANN Hans, MD, PhD. It was created on their behalf by Almetta Pesa, an ophthalmic technician. The creation of this record is the provider's dictation and/or activities during the visit.    Electronically signed by: Almetta Pesa, OA, 05/21/24  1:37 PM  Redell JUDITHANN Hans, M.D., Ph.D. Diseases & Surgery of the Retina and Vitreous [facility]    Abbreviations: M myopia (nearsighted); A astigmatism; H hyperopia (farsighted); P presbyopia; Mrx spectacle prescription;  CTL contact lenses; OD right eye; OS left eye; OU both eyes  XT exotropia; ET esotropia; PEK punctate epithelial keratitis; PEE punctate epithelial erosions; DES dry eye syndrome; MGD meibomian gland dysfunction; ATs artificial tears; PFAT's preservative free artificial tears; NSC nuclear sclerotic cataract; PSC posterior subcapsular cataract; ERM epi-retinal membrane; PVD posterior vitreous detachment; RD retinal detachment; DM diabetes mellitus; DR diabetic retinopathy; NPDR non-proliferative diabetic retinopathy; PDR proliferative diabetic retinopathy; CSME clinically significant macular edema; DME diabetic macular edema;  dbh dot blot hemorrhages; CWS cotton wool spot; POAG primary open angle glaucoma; C/D cup-to-disc ratio; HVF humphrey visual field; GVF goldmann visual field; OCT optical coherence tomography; IOP intraocular pressure; BRVO Branch retinal vein occlusion; CRVO central retinal vein occlusion; CRAO central retinal artery occlusion; BRAO branch retinal artery occlusion; RT retinal tear; SB scleral buckle; PPV pars plana vitrectomy; VH Vitreous hemorrhage; PRP panretinal laser photocoagulation; IVK intravitreal kenalog; VMT vitreomacular traction; MH Macular hole;  NVD neovascularization of the disc; NVE neovascularization elsewhere; AREDS age related eye disease study; ARMD age related macular degeneration; POAG primary open angle glaucoma; EBMD epithelial/anterior basement membrane dystrophy; ACIOL anterior chamber intraocular lens; IOL intraocular lens; PCIOL posterior chamber intraocular lens; Phaco/IOL phacoemulsification with intraocular lens placement; PRK photorefractive keratectomy; LASIK laser assisted in situ keratomileusis; HTN hypertension; DM diabetes mellitus; COPD chronic obstructive pulmonary disease "

## 2024-06-03 ENCOUNTER — Encounter (INDEPENDENT_AMBULATORY_CARE_PROVIDER_SITE_OTHER): Admitting: Ophthalmology

## 2024-06-03 DIAGNOSIS — H35033 Hypertensive retinopathy, bilateral: Secondary | ICD-10-CM

## 2024-06-03 DIAGNOSIS — E119 Type 2 diabetes mellitus without complications: Secondary | ICD-10-CM

## 2024-06-03 DIAGNOSIS — H353221 Exudative age-related macular degeneration, left eye, with active choroidal neovascularization: Secondary | ICD-10-CM

## 2024-06-03 DIAGNOSIS — H353112 Nonexudative age-related macular degeneration, right eye, intermediate dry stage: Secondary | ICD-10-CM

## 2024-06-03 DIAGNOSIS — H04123 Dry eye syndrome of bilateral lacrimal glands: Secondary | ICD-10-CM

## 2024-06-03 DIAGNOSIS — Z961 Presence of intraocular lens: Secondary | ICD-10-CM

## 2024-06-03 DIAGNOSIS — Z7984 Long term (current) use of oral hypoglycemic drugs: Secondary | ICD-10-CM

## 2024-06-03 DIAGNOSIS — H25811 Combined forms of age-related cataract, right eye: Secondary | ICD-10-CM

## 2024-06-03 DIAGNOSIS — H3552 Pigmentary retinal dystrophy: Secondary | ICD-10-CM

## 2024-06-03 DIAGNOSIS — Z7985 Long-term (current) use of injectable non-insulin antidiabetic drugs: Secondary | ICD-10-CM

## 2024-06-03 DIAGNOSIS — I10 Essential (primary) hypertension: Secondary | ICD-10-CM

## 2024-07-07 ENCOUNTER — Ambulatory Visit
